# Patient Record
Sex: Female | Born: 1935 | Race: White | Hispanic: No | Smoking: Never smoker
Health system: Southern US, Community
[De-identification: ages and names within clinical notes are randomized; demographics above are authoritative.]

## PROBLEM LIST (undated history)

## (undated) DIAGNOSIS — J449 Chronic obstructive pulmonary disease, unspecified: Secondary | ICD-10-CM

## (undated) DIAGNOSIS — I10 Essential (primary) hypertension: Secondary | ICD-10-CM

## (undated) DIAGNOSIS — I951 Orthostatic hypotension: Secondary | ICD-10-CM

## (undated) DIAGNOSIS — F0280 Dementia in other diseases classified elsewhere without behavioral disturbance: Secondary | ICD-10-CM

## (undated) DIAGNOSIS — M25551 Pain in right hip: Secondary | ICD-10-CM

## (undated) DIAGNOSIS — F0391 Unspecified dementia with behavioral disturbance: Secondary | ICD-10-CM

## (undated) DIAGNOSIS — N189 Chronic kidney disease, unspecified: Secondary | ICD-10-CM

## (undated) DIAGNOSIS — J189 Pneumonia, unspecified organism: Secondary | ICD-10-CM

## (undated) DIAGNOSIS — J810 Acute pulmonary edema: Secondary | ICD-10-CM

## (undated) DIAGNOSIS — F329 Major depressive disorder, single episode, unspecified: Secondary | ICD-10-CM

## (undated) DIAGNOSIS — S32019A Unspecified fracture of first lumbar vertebra, initial encounter for closed fracture: Secondary | ICD-10-CM

## (undated) DIAGNOSIS — G309 Alzheimer's disease, unspecified: Secondary | ICD-10-CM

## (undated) DIAGNOSIS — S72009A Fracture of unspecified part of neck of unspecified femur, initial encounter for closed fracture: Secondary | ICD-10-CM

## (undated) DIAGNOSIS — F32A Depression, unspecified: Secondary | ICD-10-CM

## (undated) DIAGNOSIS — J9600 Acute respiratory failure, unspecified whether with hypoxia or hypercapnia: Secondary | ICD-10-CM

## (undated) HISTORY — DX: Acute pulmonary edema: J81.0

## (undated) HISTORY — DX: Pneumonia, unspecified organism: J18.9

## (undated) HISTORY — DX: Pain in right hip: M25.551

## (undated) HISTORY — DX: Acute respiratory failure, unspecified whether with hypoxia or hypercapnia: J96.00

## (undated) HISTORY — DX: Orthostatic hypotension: I95.1

## (undated) HISTORY — DX: Unspecified fracture of first lumbar vertebra, initial encounter for closed fracture: S32.019A

## (undated) HISTORY — DX: Unspecified dementia with behavioral disturbance: F03.91

## (undated) HISTORY — DX: Fracture of unspecified part of neck of unspecified femur, initial encounter for closed fracture: S72.009A

---

## 2004-11-28 ENCOUNTER — Inpatient Hospital Stay (HOSPITAL_COMMUNITY): Admission: EM | Admit: 2004-11-28 | Discharge: 2004-12-08 | Payer: Self-pay | Admitting: Emergency Medicine

## 2004-12-06 ENCOUNTER — Encounter (INDEPENDENT_AMBULATORY_CARE_PROVIDER_SITE_OTHER): Payer: Self-pay | Admitting: *Deleted

## 2010-03-15 ENCOUNTER — Inpatient Hospital Stay (HOSPITAL_COMMUNITY): Admission: EM | Admit: 2010-03-15 | Discharge: 2010-03-19 | Payer: Self-pay | Admitting: Emergency Medicine

## 2010-03-18 ENCOUNTER — Ambulatory Visit: Payer: Self-pay | Admitting: Physical Medicine & Rehabilitation

## 2010-03-19 ENCOUNTER — Inpatient Hospital Stay (HOSPITAL_COMMUNITY)
Admission: RE | Admit: 2010-03-19 | Discharge: 2010-03-26 | Payer: Self-pay | Admitting: Physical Medicine & Rehabilitation

## 2010-03-19 ENCOUNTER — Ambulatory Visit: Payer: Self-pay | Admitting: Physical Medicine & Rehabilitation

## 2010-12-12 ENCOUNTER — Encounter: Payer: Self-pay | Admitting: Family Medicine

## 2011-02-08 LAB — BASIC METABOLIC PANEL
BUN: 11 mg/dL (ref 6–23)
BUN: 17 mg/dL (ref 6–23)
BUN: 20 mg/dL (ref 6–23)
CO2: 24 mEq/L (ref 19–32)
CO2: 29 mEq/L (ref 19–32)
CO2: 34 mEq/L — ABNORMAL HIGH (ref 19–32)
CO2: 36 mEq/L — ABNORMAL HIGH (ref 19–32)
Calcium: 9.5 mg/dL (ref 8.4–10.5)
Calcium: 9.1 mg/dL (ref 8.4–10.5)
Calcium: 9.1 mg/dL (ref 8.4–10.5)
Chloride: 99 mEq/L (ref 96–112)
Chloride: 100 mEq/L (ref 96–112)
Chloride: 98 mEq/L (ref 96–112)
Creatinine, Ser: 1.81 mg/dL — ABNORMAL HIGH (ref 0.4–1.2)
Creatinine, Ser: 0.95 mg/dL (ref 0.4–1.2)
Creatinine, Ser: 1.17 mg/dL (ref 0.4–1.2)
GFR calc Af Amer: 32 mL/min — ABNORMAL LOW (ref >60)
GFR calc Af Amer: 53 mL/min — ABNORMAL LOW (ref >60)
GFR calc Af Amer: 57 mL/min — ABNORMAL LOW (ref >60)
GFR calc non Af Amer: >60 mL/min (ref >60)
GFR calc non Af Amer: 47 mL/min — ABNORMAL LOW (ref >60)
Glucose, Bld: 106 mg/dL — ABNORMAL HIGH (ref 70–99)
Glucose, Bld: 113 mg/dL — ABNORMAL HIGH (ref 70–99)
Potassium: 3.5 mEq/L (ref 3.5–5.1)
Potassium: 3.6 mEq/L (ref 3.5–5.1)
Potassium: 3.8 mEq/L (ref 3.5–5.1)
Sodium: 135 mEq/L (ref 135–145)
Sodium: 137 mEq/L (ref 135–145)
Sodium: 140 mEq/L (ref 135–145)

## 2011-02-08 LAB — DIFFERENTIAL
Basophils Absolute: 0 10*3/uL (ref 0.0–0.1)
Basophils Absolute: 0 10*3/uL (ref 0.0–0.1)
Basophils Relative: 0 % (ref 0–1)
Basophils Relative: 0 % (ref 0–1)
Eosinophils Absolute: 0 10*3/uL (ref 0.0–0.7)
Eosinophils Relative: 1 % (ref 0–5)
Eosinophils Relative: 0 % (ref 0–5)
Lymphocytes Relative: 59 % — ABNORMAL HIGH (ref 12–46)
Lymphocytes Relative: 37 % (ref 12–46)
Lymphs Abs: 5.1 K/uL — ABNORMAL HIGH (ref 0.7–4.0)
Monocytes Absolute: 0.7 10*3/uL (ref 0.1–1.0)
Monocytes Relative: 6 % (ref 3–12)
Monocytes Relative: 5 % (ref 3–12)
Neutro Abs: 4.7 10*3/uL (ref 1.7–7.7)
Neutro Abs: 7.9 K/uL — ABNORMAL HIGH (ref 1.7–7.7)
Neutrophils Relative %: 58 % (ref 43–77)

## 2011-02-08 LAB — CBC
HCT: 34.6 % — ABNORMAL LOW (ref 36.0–46.0)
Hemoglobin: 11.4 g/dL — ABNORMAL LOW (ref 12.0–15.0)
MCHC: 34.6 g/dL (ref 30.0–36.0)
MCHC: 32.9 g/dL (ref 30.0–36.0)
MCV: 91.3 fL (ref 78.0–100.0)
MCV: 91.4 fL (ref 78.0–100.0)
Platelets: 263 10*3/uL (ref 150–400)
Platelets: 222 K/uL (ref 150–400)
RBC: 3.78 MIL/uL — ABNORMAL LOW (ref 3.87–5.11)
RDW: 14 % (ref 11.5–15.5)
RDW: 13.9 % (ref 11.5–15.5)
WBC: 13.8 10*3/uL — ABNORMAL HIGH (ref 4.0–10.5)

## 2011-02-08 LAB — COMPREHENSIVE METABOLIC PANEL
AST: 28 U/L (ref 0–37)
Albumin: 3.2 g/dL — ABNORMAL LOW (ref 3.5–5.2)
Calcium: 9.6 mg/dL (ref 8.4–10.5)
Creatinine, Ser: 1.32 mg/dL — ABNORMAL HIGH (ref 0.4–1.2)
GFR calc Af Amer: 46 mL/min — ABNORMAL LOW (ref >60)
GFR calc non Af Amer: 38 mL/min — ABNORMAL LOW (ref >60)
Total Protein: 5.5 g/dL — ABNORMAL LOW (ref 6.0–8.3)

## 2011-02-08 LAB — POTASSIUM
Potassium: 3.2 mEq/L — ABNORMAL LOW (ref 3.5–5.1)
Potassium: 3.5 mEq/L (ref 3.5–5.1)

## 2011-02-08 LAB — URINALYSIS, ROUTINE W REFLEX MICROSCOPIC
Bilirubin Urine: NEGATIVE
Glucose, UA: NEGATIVE mg/dL
Hgb urine dipstick: NEGATIVE
Ketones, ur: NEGATIVE mg/dL
Nitrite: NEGATIVE
Protein, ur: NEGATIVE mg/dL
Specific Gravity, Urine: 1.018 (ref 1.005–1.030)
Urobilinogen, UA: 0.2 mg/dL (ref 0.0–1.0)
pH: 7 (ref 5.0–8.0)

## 2011-02-08 LAB — APTT: aPTT: 23 seconds — ABNORMAL LOW (ref 24–37)

## 2011-02-08 LAB — BASIC METABOLIC PANEL WITH GFR
BUN: 26 mg/dL — ABNORMAL HIGH (ref 6–23)
Chloride: 102 meq/L (ref 96–112)
Creatinine, Ser: 1.1 mg/dL (ref 0.4–1.2)
Glucose, Bld: 125 mg/dL — ABNORMAL HIGH (ref 70–99)
Potassium: 4.8 meq/L (ref 3.5–5.1)

## 2011-02-08 LAB — PROTIME-INR
INR: 1.07 (ref 0.00–1.49)
Prothrombin Time: 13.8 seconds (ref 11.6–15.2)

## 2011-04-08 NOTE — H&P (Signed)
NAMEMANDA, HOLSTAD NO.:  [id number]   MEDICAL RECORD NO.:  [id number]          PATIENT TYPE:  INP   LOCATION:  1828                         FACILITY:  [facility]   PHYSICIAN:  Danae Chen, M.D.DATE OF BIRTH:  January 08, 1919   DATE OF ADMISSION:  11/28/2004  DATE OF DISCHARGE:                                HISTORY & PHYSICAL   PRIMARY CARE PHYSICIANS:  Production assistant, radio.   CHIEF COMPLAINT:  Cough and shortness of breath.   HISTORY OF PRESENT ILLNESS:  The patient is a pleasant 77 year old elderly  female who presents to the emergency department with a six-day history of  progressive shortness of breath, productive cough, lethargy, and decreased  appetite.  She reports that she has been feeling like she has had a cold for  a couple of weeks, but only got worse over the last couple of days.  She has  felt feverish but has not checked her temperature.  She has had no chills,  however.  No nausea, vomiting, or diarrhea.  Her appetite has been decreased  somewhat.  She normally takes an Advair inhaler and Lexapro for her  depression, but has not been taking anything else over-the-counter for her  symptoms.  She has also had some rhinorrhea and some sinus congestion as  well.  She has never been hospitalized for pneumonia or COPD exacerbation to  her knowledge.  She had a diagnosis of leukemia almost 20 years ago, but she  said that this is currently stable and is not being followed by any  specialist.  She denies any chest pain.  She has had the shortness of breath  when she coughs.  No recent weight gain or weight loss.  No hematochezia.  No melena.  No hematemesis.  Slight headache.   PAST MEDICAL HISTORY:  1.  Depression.  2.  COPD.  3.  Leukemia, remote.   PAST SURGICAL HISTORY:  None.   FAMILY HISTORY:  No diabetes, hypertension, or coronary artery disease that  she is aware of.   SOCIAL HISTORY:  The patient is widowed.  She has two grown  children.  She  lives with her son.  Her daughter and son-in-law live nearby.  She does not  smoke.  She does not drink.  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  She is still  independent of activities of daily life.  She still drives.   REVIEW OF SYMPTOMS:  Per the history of present illness.   LABORATORY DATA:  White count 11.7, hemoglobin 12.3, platelets 249,000.  Chest x-ray is consistent with COPD, no infiltrates or active disease noted  at this time.   PHYSICAL EXAMINATION:  VITAL SIGNS:  Temperature 97, pulse 65, blood  pressure 123/63, O2 saturation 99% on 2 L.  GENERAL:  She is in no acute distress.  HEENT:  Oropharynx is clear.  She has mild sinus tenderness to palpation  over her maxillary sinuses.  NECK:  Supple.  No lymphadenopathy.  LUNG EXAM:  Reveals diffuse wheezing throughout, upper lobes greater than  lower lobes,  some rales as well with decreased breath sounds bilaterally on  the bases.  CARDIAC:  Heart rate is regular, no murmurs.  ABDOMEN:  Soft, nontender, no rebound or guarding.  EXTREMITIES:  No peripheral edema.  NEUROLOGICAL:  Nonfocal.  She is alert and oriented.  Grip strength 5/5  bilaterally.   IMPRESSION:  This is an 77 year old with underlying chronic obstructive  pulmonary disease with probable exacerbation of such with bronchitis and/or  pneumonia.  She may be a bit dry, and, with hydration, she may fluff out a  pneumonia as well.   PLAN:  At this time, we will admit her to a telemetry bed, start IV  antibiotics and IV steroids, breathing treatments, oxygen, mucolytic agent,  and cough syrup.  We will continue her depression medication.  We will  reassess her clinically and repeat a chest x-ray if necessary.      RLK/MEDQ  D:  11/28/2004  T:  11/28/2004  Job:  [id number]   cc:   [facility]

## 2011-04-08 NOTE — Discharge Summary (Signed)
NAMEBRENNA, Andrea [name] NO.:  [id number]   MEDICAL RECORD NO.:  [id number]          PATIENT TYPE:  INP   LOCATION:  6743                         FACILITY:  [facility]   PHYSICIAN:  Danae Chen, M.D.DATE OF BIRTH:  09-10-1919   DATE OF ADMISSION:  11/28/2004  DATE OF DISCHARGE:  12/08/2004                                 DISCHARGE SUMMARY   PRIMARY PHYSICIAN:  Maryelizabeth Rowan, M.D., [facility].   DISCHARGE DIAGNOSIS:  1.  Community acquired bacterial pneumonia.  2.  Exacerbation of chronic obstructive pulmonary disease with bronchitis.  3.  Chronic obstructive pulmonary disease.  4.  Depression.   DISCHARGE MEDICATIONS:  The patient is to resume her home medications at her  prior doses.  This includes:  1.  Aspirin 81 mg p.o. daily.  2.  Lexapro 20 mg p.o. daily.  3.  Advair Diskus 50/250 mcg 1 puff daily.  New medications:  Albuterol measured dose inhaler 2 puffs four times a day  as needed for shortness of breath.   FOLLOWUP APPOINTMENT:  She is to see Dr. Duanne Guess at [facility] on 12/16/04, Thursday, at 12 noon.   PROCEDURES:  As noted, 2D echocardiogram showing ejection fraction of 40-  50%.  Swallow evaluation showing no obvious swallowing problems, no  dysphagia.   BRIEF HOSPITAL COURSE:  The patient is an 77 year old female who was  admitted with complaints of cough and shortness of breath.  Initial chest x-  ray was consistent with COPD but having shown no infiltrate but she was  desaturating off of oxygen.  She was admitted for a probable COPD  exacerbation with a secondary viral URI.  During her hospital course, it was  found that she had probably developed a bacterial pneumonia with a low grade  fever and some productive sputum.  Empiric antibiotics were started, along  with steroids, oxygen, and breathing treatments.  Her condition did improve  but she had complained of some difficulty swallowing during her  hospital  course.  Swallow study was done which showed no evidence of overt dysphagia.  In addition, an echocardiogram was also checked for the persistent fatigue  and deconditioning but this showed preserved ejection fraction of 40-50%.  At the time of discharge, her condition is improved and she does have home  health physical therapy ordered to come out to her home and help her with  reconditioning.  She is to go home with her daughter, who will be providing  care for her.  At the time of discharge, she has completed a 10-day course  of antibiotics, as well as her prednisone taper.  On followup, if she could  have her room air O2 saturation checked, lung exam, and overall functional  capacity.   PERTINENT DISCHARGE LABORATORY DATA:  BUN 33, creatinine 0.9, sodium 135,  potassium 3.8, glucose 127.  She was afebrile at the time of discharge.  Lung exam was clear.  Heart rate regular and O2 saturation was 97% on room  air.      RLK/MEDQ  D:  12/08/2004  T:  12/08/2004  Job:  [id number]  cc:   Maryelizabeth Rowan, M.D.  [facility]  [place], [place] [address]  Fax: [phone]

## 2013-06-16 ENCOUNTER — Encounter (HOSPITAL_COMMUNITY): Payer: Self-pay | Admitting: *Deleted

## 2013-06-16 ENCOUNTER — Emergency Department (HOSPITAL_COMMUNITY)
Admission: EM | Admit: 2013-06-16 | Discharge: 2013-06-17 | Disposition: A | Discharge status: Other Acute Inpatient Hospital | Payer: Medicare Other | Attending: Emergency Medicine | Admitting: Emergency Medicine

## 2013-06-16 DIAGNOSIS — F0280 Dementia in other diseases classified elsewhere without behavioral disturbance: Secondary | ICD-10-CM | POA: Insufficient documentation

## 2013-06-16 DIAGNOSIS — Z79899 Other long term (current) drug therapy: Secondary | ICD-10-CM | POA: Insufficient documentation

## 2013-06-16 DIAGNOSIS — F322 Major depressive disorder, single episode, severe without psychotic features: Secondary | ICD-10-CM

## 2013-06-16 DIAGNOSIS — I129 Hypertensive chronic kidney disease with stage 1 through stage 4 chronic kidney disease, or unspecified chronic kidney disease: Secondary | ICD-10-CM | POA: Insufficient documentation

## 2013-06-16 DIAGNOSIS — F0390 Unspecified dementia without behavioral disturbance: Secondary | ICD-10-CM | POA: Insufficient documentation

## 2013-06-16 DIAGNOSIS — J4489 Other specified chronic obstructive pulmonary disease: Secondary | ICD-10-CM | POA: Insufficient documentation

## 2013-06-16 DIAGNOSIS — N189 Chronic kidney disease, unspecified: Secondary | ICD-10-CM | POA: Insufficient documentation

## 2013-06-16 DIAGNOSIS — J449 Chronic obstructive pulmonary disease, unspecified: Secondary | ICD-10-CM | POA: Insufficient documentation

## 2013-06-16 DIAGNOSIS — G309 Alzheimer's disease, unspecified: Secondary | ICD-10-CM | POA: Insufficient documentation

## 2013-06-16 HISTORY — DX: Dementia in other diseases classified elsewhere, unspecified severity, without behavioral disturbance, psychotic disturbance, mood disturbance, and anxiety: F02.80

## 2013-06-16 HISTORY — DX: Major depressive disorder, single episode, unspecified: F32.9

## 2013-06-16 HISTORY — DX: Depression, unspecified: F32.A

## 2013-06-16 HISTORY — DX: Essential (primary) hypertension: I10

## 2013-06-16 HISTORY — DX: Alzheimer's disease, unspecified: G30.9

## 2013-06-16 HISTORY — DX: Chronic obstructive pulmonary disease, unspecified: J44.9

## 2013-06-16 HISTORY — DX: Chronic kidney disease, unspecified: N18.9

## 2013-06-16 LAB — CBC
HCT: 38.1 % (ref 36.0–46.0)
Hemoglobin: 13.2 g/dL (ref 12.0–15.0)
MCH: 31.7 pg (ref 26.0–34.0)
MCHC: 34.6 g/dL (ref 30.0–36.0)
MCV: 91.6 fL (ref 78.0–100.0)
Platelets: 251 K/uL (ref 150–400)
RBC: 4.16 MIL/uL (ref 3.87–5.11)
RDW: 13.6 % (ref 11.5–15.5)
WBC: 8.9 K/uL (ref 4.0–10.5)

## 2013-06-16 LAB — COMPREHENSIVE METABOLIC PANEL WITH GFR
ALT: 10 U/L (ref 0–35)
AST: 17 U/L (ref 0–37)
Albumin: 4.3 g/dL (ref 3.5–5.2)
Alkaline Phosphatase: 50 U/L (ref 39–117)
Chloride: 95 meq/L — ABNORMAL LOW (ref 96–112)
Potassium: 3.9 meq/L (ref 3.5–5.1)
Sodium: 134 meq/L — ABNORMAL LOW (ref 135–145)
Total Bilirubin: 0.5 mg/dL (ref 0.3–1.2)

## 2013-06-16 LAB — COMPREHENSIVE METABOLIC PANEL
BUN: 25 mg/dL — ABNORMAL HIGH (ref 6–23)
CO2: 25 mEq/L (ref 19–32)
Calcium: 10.1 mg/dL (ref 8.4–10.5)
Creatinine, Ser: 1.44 mg/dL — ABNORMAL HIGH (ref 0.50–1.10)
GFR calc Af Amer: 36 mL/min — ABNORMAL LOW (ref >90)
GFR calc non Af Amer: 31 mL/min — ABNORMAL LOW (ref >90)
Glucose, Bld: 99 mg/dL (ref 70–99)
Total Protein: 6.6 g/dL (ref 6.0–8.3)

## 2013-06-16 LAB — ETHANOL: Alcohol, Ethyl (B): <11 mg/dL (ref 0–11)

## 2013-06-16 LAB — URINALYSIS, ROUTINE W REFLEX MICROSCOPIC
Bilirubin Urine: NEGATIVE
Glucose, UA: NEGATIVE mg/dL
Hgb urine dipstick: NEGATIVE
Ketones, ur: NEGATIVE mg/dL
Nitrite: NEGATIVE
Protein, ur: NEGATIVE mg/dL
Specific Gravity, Urine: 1.019 (ref 1.005–1.030)
Urobilinogen, UA: 1 mg/dL (ref 0.0–1.0)
pH: 8 (ref 5.0–8.0)

## 2013-06-16 LAB — RAPID URINE DRUG SCREEN, HOSP PERFORMED
Amphetamines: NOT DETECTED
Barbiturates: NOT DETECTED
Benzodiazepines: NOT DETECTED
Cocaine: NOT DETECTED
Opiates: NOT DETECTED
Tetrahydrocannabinol: NOT DETECTED

## 2013-06-16 LAB — URINE MICROSCOPIC-ADD ON

## 2013-06-16 LAB — SALICYLATE LEVEL: Salicylate Lvl: <2 mg/dL — ABNORMAL LOW (ref 2.8–20.0)

## 2013-06-16 LAB — ACETAMINOPHEN LEVEL: Acetaminophen (Tylenol), Serum: <15 ug/mL (ref 10–30)

## 2013-06-16 MED ORDER — DONEPEZIL HCL 10 MG PO TABS
10.0000 mg | ORAL_TABLET | Freq: Every day | ORAL | Status: DC
  Administered 2013-06-17: 10 mg via ORAL
  Filled 2013-06-16: qty 1

## 2013-06-16 MED ORDER — LOSARTAN POTASSIUM 50 MG PO TABS
100.0000 mg | ORAL_TABLET | Freq: Every day | ORAL | Status: DC
  Administered 2013-06-16 – 2013-06-17 (×2): 100 mg via ORAL
  Filled 2013-06-16 (×2): qty 2

## 2013-06-16 MED ORDER — TIOTROPIUM BROMIDE MONOHYDRATE 18 MCG IN CAPS
18.0000 ug | ORAL_CAPSULE | Freq: Every day | RESPIRATORY_TRACT | Status: DC
  Administered 2013-06-17: 18 ug via RESPIRATORY_TRACT
  Filled 2013-06-16: qty 5

## 2013-06-16 MED ORDER — ACETAMINOPHEN 325 MG PO TABS: 650.0000 mg | ORAL_TABLET | ORAL | Status: DC | PRN

## 2013-06-16 MED ORDER — HYDROCHLOROTHIAZIDE 12.5 MG PO CAPS
12.5000 mg | ORAL_CAPSULE | Freq: Every day | ORAL | Status: DC
  Administered 2013-06-16 – 2013-06-17 (×2): 12.5 mg via ORAL
  Filled 2013-06-16 (×2): qty 1

## 2013-06-16 MED ORDER — LORAZEPAM 1 MG PO TABS
0.5000 mg | ORAL_TABLET | Freq: Three times a day (TID) | ORAL | Status: DC | PRN
  Administered 2013-06-16: 0.5 mg via ORAL
  Filled 2013-06-16 (×2): qty 1

## 2013-06-16 MED ORDER — ESCITALOPRAM OXALATE 10 MG PO TABS
20.0000 mg | ORAL_TABLET | Freq: Every day | ORAL | Status: DC
  Administered 2013-06-16 – 2013-06-17 (×2): 20 mg via ORAL
  Filled 2013-06-16: qty 2
  Filled 2013-06-16 (×2): qty 1

## 2013-06-16 MED ORDER — BUPROPION HCL ER (XL) 150 MG PO TB24
150.0000 mg | ORAL_TABLET | Freq: Every day | ORAL | Status: DC
  Administered 2013-06-17: 150 mg via ORAL
  Filled 2013-06-16: qty 1

## 2013-06-16 NOTE — BH Assessment (Addendum)
Assessment Note     Patient is a 79 year old white female that was brought to the ER by her daughter and grand daughter.  Patient reports feelings of depression and hopelessness. Patient was alert and orientated during the assessment.  Patient reports a family history of mental illness. Patient reports previous psychiatric hospitalizations for SI.  Patient reports that her husband died in 65 and her son committed suicide in 2008.  Patient repots, "ever since her husband died in 200 she has suffered with deep depression".  Patient receives medication management for her depression.  Patient reports that the medication is not working.  Patient denies having a plan to hurt herself.  Patient denies HI.  Patient denies psychosis.     Axis I:  Dementia and Major Depressive Disorder  Axis II: Deferred Axis III:  Past Medical History  Diagnosis Date  . COPD (chronic obstructive pulmonary disease)   . Alzheimer's dementia   . Depression   . Chronic kidney disease   . Hypertension    Axis IV: other psychosocial or environmental problems and problems related to social environment Axis V: 31-40 impairment in reality testing  Past Medical History:  Past Medical History  Diagnosis Date  . COPD (chronic obstructive pulmonary disease)   . Alzheimer's dementia   . Depression   . Chronic kidney disease   . Hypertension     History reviewed. No pertinent past surgical history.  Family History:  Family History  Problem Relation Age of Onset  . Suicidality Other     Social History:  reports that she has never smoked. She does not have any smokeless tobacco history on file. She reports that she does not drink alcohol. Her drug history is not on file.  Additional Social History:     CIWA: CIWA-Ar BP: 145/62 mmHg Pulse Rate: 76 COWS:    Allergies:  Allergies  Allergen Reactions  . Aspirin     Stomach upset    Home Medications:  (Not in a hospital admission)  OB/GYN Status:  No LMP  recorded. Patient is postmenopausal.  General Assessment Data Location of Assessment: [facility] ED ACT Assessment: Yes Living Arrangements: Children Can pt return to current living arrangement?: Yes Admission Status: Voluntary Is patient capable of signing voluntary admission?: Yes Transfer from: Acute Hospital Referral Source: Self/Family/Friend  Education Status Is patient currently in school?: No  Risk to self Suicidal Ideation: Yes-Currently Present Suicidal Intent: No Is patient at risk for suicide?: Yes Suicidal Plan?: No Access to Means: No What has been your use of drugs/alcohol within the last 12 months?: None Previous Attempts/Gestures: No How many times?: 2 Other Self Harm Risks: None Triggers for Past Attempts: Family contact;Anniversary;Unpredictable Intentional Self Injurious Behavior: None Family Suicide History: Yes (2008 son shot himself) Recent stressful life event(s): Loss (Comment) (Husband died in 73) Persecutory voices/beliefs?: No Depression: Yes Depression Symptoms: Despondent;Tearfulness;Isolating;Fatigue;Loss of interest in usual pleasures;Feeling worthless/self pity Substance abuse history and/or treatment for substance abuse?: No Suicide prevention information given to non-admitted patients: Yes  Risk to Others Homicidal Ideation: No Thoughts of Harm to Others: No Current Homicidal Intent: No Current Homicidal Plan: No Access to Homicidal Means: No Identified Victim: None Reported History of harm to others?: No Assessment of Violence: None Noted Violent Behavior Description: calm and cooperative Does patient have access to weapons?: No Criminal Charges Pending?: No Does patient have a court date: No  Psychosis Hallucinations: None noted Delusions: None noted  Mental Status Report Appear/Hygiene: Other (Comment) Eye Contact:  Fair Motor Activity: Freedom of movement Speech: Soft;Slow Level of Consciousness: Quiet/awake Mood:  Depressed;Sullen Affect: Depressed;Blunted Anxiety Level: None Thought Processes: Coherent;Relevant Judgement: Unimpaired Orientation: Person;Place;Time;Situation Obsessive Compulsive Thoughts/Behaviors: None  Cognitive Functioning Concentration: Decreased Memory: Recent Impaired;Remote Impaired IQ: Average Insight: Fair Impulse Control: Poor Appetite: Fair Weight Loss: 0 Weight Gain: 0 Sleep: Increased Total Hours of Sleep: 9 Vegetative Symptoms: None  ADLScreening [facility] Assessment Services) Patient's cognitive ability adequate to safely complete daily activities?: Yes Patient able to express need for assistance with ADLs?: Yes Independently performs ADLs?: No (Patient uses a cane )  Abuse/Neglect [facility]) Physical Abuse: Denies Verbal Abuse: Denies Sexual Abuse: Denies  Prior Inpatient Therapy Prior Inpatient Therapy: No Prior Therapy Dates: na Prior Therapy Facilty/Provider(s): na Reason for Treatment: na  Prior Outpatient Therapy Prior Outpatient Therapy: Yes Prior Therapy Dates: ongoing  Reason for Treatment: Depression  ADL Screening (condition at time of admission) Patient's cognitive ability adequate to safely complete daily activities?: Yes Patient able to express need for assistance with ADLs?: Yes Independently performs ADLs?: No (Patient uses a cane ) Walks in Home: Independent with device (comment) (4-prong walker)       Abuse/Neglect Assessment (Assessment to be complete while patient is alone) Physical Abuse: Denies Verbal Abuse: Denies Sexual Abuse: Denies Values / Beliefs Cultural Requests During Hospitalization: None Spiritual Requests During Hospitalization: None        Additional Information 1:1 In Past 12 Months?: No CIRT Risk: No Elopement Risk: No Does patient have medical clearance?: Yes     Disposition: Pending inpatient placement.  Disposition Initial Assessment Completed for this Encounter: Yes Disposition of Patient:  Referred to Patient referred to: Other (Comment)  On Site Evaluation by:   Reviewed with Physician:     Phillip Heal LaVerne 06/16/2013 6:08 PM

## 2013-06-16 NOTE — BHH Counselor (Signed)
Writer faxed assessment and supporting documentation to [facility]  for placement.  Writer informed the patient and her daughter of the referral.

## 2013-06-16 NOTE — ED Notes (Signed)
The [pt has had  Depression for months and she just moved here from [place].  She takes one med and that is not helping her depression according to her daughter.

## 2013-06-16 NOTE — ED Provider Notes (Signed)
CSN: [id number]     Arrival date & time 06/16/13  1520 History     First MD Initiated Contact with Patient 06/16/13 1558     Chief Complaint  Patient presents with  . Depression   (Consider location/radiation/quality/duration/timing/severity/associated sxs/prior Treatment) HPI Comments: Pt with long standing depression, h/o SI in the past, lives 6 months in [place], 6 months here, son has committed suicide in the past, has gotten more depressed, has lost will to live, no active suicide plans currently, but she is fearful she might get to that point.  Patient is a 78 y.o. female presenting with mental health disorder. The history is provided by the patient and a relative.  Mental Health Problem Presenting symptoms: depression   Presenting symptoms: no self mutilation and no suicidal thoughts   Onset quality:  Gradual Progression:  Worsening Chronicity:  Chronic Context: not alcohol use, not drug abuse, not medication, not noncompliant, not recent medication change and not stressful life event   Associated symptoms: anhedonia   Associated symptoms: no abdominal pain, no appetite change, no decreased need for sleep, no euphoric mood, no insomnia and no poor judgment   Risk factors: family hx of mental illness and hx of mental illness   Risk factors: no hx of suicide attempts     Past Medical History  Diagnosis Date  . COPD (chronic obstructive pulmonary disease)   . Alzheimer's dementia   . Depression   . Chronic kidney disease   . Hypertension    History reviewed. No pertinent past surgical history. Family History  Problem Relation Age of Onset  . Suicidality Other    History  Substance Use Topics  . Smoking status: Never Smoker   . Smokeless tobacco: Not on file  . Alcohol Use: No   OB History   Grav Para Term Preterm Abortions TAB SAB Ect Mult Living                 Review of Systems  Constitutional: Negative for appetite change.  Gastrointestinal: Negative for  abdominal pain.  Psychiatric/Behavioral: Positive for dysphoric mood. Negative for suicidal ideas, sleep disturbance and self-injury. The patient does not have insomnia.   All other systems reviewed and are negative.    Allergies  Aspirin  Home Medications   Current Outpatient Rx  Name  Route  Sig  Dispense  Refill  . buPROPion (WELLBUTRIN XL) 150 MG 24 hr tablet   Oral   Take 150 mg by mouth daily. Take 150mg  once daily for 7 days, then increase to 300mg  daily         . clonazePAM (KLONOPIN) 0.5 MG tablet   Oral   Take 0.25 mg by mouth 2 (two) times daily as needed for anxiety.         . donepezil (ARICEPT) 10 MG tablet   Oral   Take 10 mg by mouth daily.         Marland Kitchen escitalopram (LEXAPRO) 20 MG tablet   Oral   Take 20 mg by mouth daily.         Marland Kitchen losartan-hydrochlorothiazide (HYZAAR) 100-12.5 MG per tablet   Oral   Take 1 tablet by mouth daily.         Marland Kitchen tiotropium (SPIRIVA) 18 MCG inhalation capsule   Inhalation   Place 18 mcg into inhaler and inhale daily.          BP 145/62  Pulse 76  Temp(Src) 97.9 F (36.6 C) (Oral)  Resp 18  SpO2 97% Physical Exam  Nursing note and vitals reviewed. Constitutional: She appears well-developed and well-nourished. No distress.  HENT:  Head: Normocephalic and atraumatic.  Eyes: Conjunctivae and EOM are normal. No scleral icterus.  Neck: Normal range of motion. Neck supple.  Cardiovascular: Normal rate, regular rhythm and intact distal pulses.   Pulmonary/Chest: Effort normal. No respiratory distress.  Abdominal: Soft. There is no tenderness.  Neurological: She is alert. She has normal strength. No sensory deficit. She exhibits normal muscle tone. Coordination normal.  Skin: Skin is warm and dry.  Psychiatric: Her speech is not rapid and/or pressured, not delayed, not tangential and not slurred. She is not agitated, not aggressive, not slowed and not withdrawn. She does not express impulsivity. She exhibits a  depressed mood. She is communicative.    ED Course   Procedures (including critical care time)  Labs Reviewed  CBC  ACETAMINOPHEN LEVEL  COMPREHENSIVE METABOLIC PANEL  ETHANOL  SALICYLATE LEVEL  URINE RAPID DRUG SCREEN (HOSP PERFORMED)  URINALYSIS, ROUTINE W REFLEX MICROSCOPIC   No results found. 1. Severe major depression     RA sat is 97% and I interpret to be normal   MDM  Labs pending.  Pt is voluntary, major depression with passive thoughts of dying and lost will to live.  Pt is willing to be admitted for severe depression.  Pt's PCP is aware of severe depression, has referred to a psychiatrist, but has not yet established.  I would recommend inpt for severe depression.    Gavin Pound. Oletta Lamas, MD 06/17/13 [phone]

## 2013-06-17 ENCOUNTER — Emergency Department (HOSPITAL_COMMUNITY): Payer: Medicare Other

## 2013-06-17 NOTE — BH Assessment (Signed)
Assessment Note   Update:  Received call from Nicholos Johns at [facility] stating she needed pt's EKG an chest x-ray, but that pt was accepted to Dr. Guss Bunde and could arrive after 1300 today.  Called pt's daughter who felt uncomfortable by report taking pt voluntarily to [facility].  As pt has SI, EDP Rancour took out IVC papers on pt, as she is a danger to herself.  Papers faxed to Magistrate and served by [employer].  Called Sgt. Paschal @ 1200 with [employer]. Transport and pt to be transported via [employer] and [address]  Called [facility] as well per [facility] recommendation, as pt 79 years of age to ensure a safe transport.  Pt nurse to call pt's daughter to inform her that pt being transported.  Updated EDP Rancour and ED staff.  Updated assessment disposition.  Pt to be discharged.    Disposition:  Disposition Initial Assessment Completed for this Encounter: Yes Disposition of Patient: Inpatient treatment program Type of inpatient treatment program: Adult Patient referred to: Other (Comment) (Pt accepted [facility])  On Site Evaluation by:   Reviewed with Physician:  Rancour   Caryl Comes 06/17/2013 12:07 PM

## 2013-06-17 NOTE — ED Notes (Signed)
Sheriff arrived now waiting for [facility]

## 2013-06-17 NOTE — ED Notes (Signed)
ACT team states patient accepted to IVC papers completed called [facility] and sheriff for transport.

## 2013-06-17 NOTE — ED Notes (Signed)
[facility] police department arrived to serve IVC papers.

## 2013-06-17 NOTE — ED Provider Notes (Signed)
Results for orders placed during the hospital encounter of 06/16/13  ACETAMINOPHEN LEVEL      Result Value Range   Acetaminophen (Tylenol), Serum <15.0  10 - 30 ug/mL  CBC      Result Value Range   WBC 8.9  4.0 - 10.5 K/uL   RBC 4.16  3.87 - 5.11 MIL/uL   Hemoglobin 13.2  12.0 - 15.0 g/dL   HCT 57.8  46.9 - 62.9 %   MCV 91.6  78.0 - 100.0 fL   MCH 31.7  26.0 - 34.0 pg   MCHC 34.6  30.0 - 36.0 g/dL   RDW 52.8  41.3 - 24.4 %   Platelets 251  150 - 400 K/uL  COMPREHENSIVE METABOLIC PANEL      Result Value Range   Sodium 134 (*) 135 - 145 mEq/L   Potassium 3.9  3.5 - 5.1 mEq/L   Chloride 95 (*) 96 - 112 mEq/L   CO2 25  19 - 32 mEq/L   Glucose, Bld 99  70 - 99 mg/dL   BUN 25 (*) 6 - 23 mg/dL   Creatinine, Ser 0.10 (*) 0.50 - 1.10 mg/dL   Calcium 27.2  8.4 - 53.6 mg/dL   Total Protein 6.6  6.0 - 8.3 g/dL   Albumin 4.3  3.5 - 5.2 g/dL   AST 17  0 - 37 U/L   ALT 10  0 - 35 U/L   Alkaline Phosphatase 50  39 - 117 U/L   Total Bilirubin 0.5  0.3 - 1.2 mg/dL   GFR calc non Af Amer 31 (*) >90 mL/min   GFR calc Af Amer 36 (*) >90 mL/min  ETHANOL      Result Value Range   Alcohol, Ethyl (B) <11  0 - 11 mg/dL  SALICYLATE LEVEL      Result Value Range   Salicylate Lvl <2.0 (*) 2.8 - 20.0 mg/dL  URINE RAPID DRUG SCREEN (HOSP PERFORMED)      Result Value Range   Opiates NONE DETECTED  NONE DETECTED   Cocaine NONE DETECTED  NONE DETECTED   Benzodiazepines NONE DETECTED  NONE DETECTED   Amphetamines NONE DETECTED  NONE DETECTED   Tetrahydrocannabinol NONE DETECTED  NONE DETECTED   Barbiturates NONE DETECTED  NONE DETECTED  URINALYSIS, ROUTINE W REFLEX MICROSCOPIC      Result Value Range   Color, Urine YELLOW  YELLOW   APPearance CLOUDY (*) CLEAR   Specific Gravity, Urine 1.019  1.005 - 1.030   pH 8.0  5.0 - 8.0   Glucose, UA NEGATIVE  NEGATIVE mg/dL   Hgb urine dipstick NEGATIVE  NEGATIVE   Bilirubin Urine NEGATIVE  NEGATIVE   Ketones, ur NEGATIVE  NEGATIVE mg/dL   Protein, ur  NEGATIVE  NEGATIVE mg/dL   Urobilinogen, UA 1.0  0.0 - 1.0 mg/dL   Nitrite NEGATIVE  NEGATIVE   Leukocytes, UA LARGE (*) NEGATIVE  URINE MICROSCOPIC-ADD ON      Result Value Range   Squamous Epithelial / LPF FEW (*) RARE   WBC, UA 3-6  <3 WBC/hpf   Bacteria, UA RARE  RARE   Urine-Other LESS THAN 10 mL OF URINE SUBMITTED      Date: 06/17/2013  Rate: 69  Rhythm: normal sinus rhythm  QRS Axis: normal  Intervals: PR prolonged  ST/T Wave abnormalities: nonspecific ST changes  Conduction Disutrbances:first-degree A-V block   Narrative Interpretation:   Old EKG Reviewed: unchanged    Sunnie Nielsen, MD 06/17/13 0630

## 2013-06-17 NOTE — ED Notes (Signed)
[place], Radiographer, therapeutic, arrived for transport. Waiting for [facility].

## 2013-06-17 NOTE — ED Provider Notes (Signed)
Patient acccepted to [facility] by Dr. Guss Bunde.  BP 138/75  Pulse 72  Temp(Src) 98.1 F (36.7 C) (Oral)  Resp 16  SpO2 94%   Andrea Octave, MD 06/17/13 1210

## 2013-06-17 NOTE — ED Notes (Signed)
Patient states does not want to go to [facility] upset about transfer agreeing to go with [facility].

## 2013-06-17 NOTE — ED Notes (Signed)
Sheriff called stated will be here before 1900.  ACT team notified.

## 2013-06-17 NOTE — ED Notes (Signed)
[facility] arrived.  

## 2013-06-17 NOTE — ED Notes (Signed)
Sheriff called stated will be on their way to ED now. Secretary paged [facility] for transport.

## 2013-06-17 NOTE — ED Notes (Signed)
Spoke with Nicholos Johns at [facility] states their facility is not ready for patient. Her instructions are waiting for 5 discharges and one was cancelled.  Nicholos Johns will call when ready to receive patient.  ACT team notified.

## 2013-07-09 ENCOUNTER — Emergency Department (HOSPITAL_COMMUNITY)
Admission: EM | Admit: 2013-07-09 | Discharge: 2013-07-09 | Disposition: A | Discharge status: Home / Self Care | Payer: Medicare Other | Attending: Emergency Medicine | Admitting: Emergency Medicine

## 2013-07-09 ENCOUNTER — Encounter (HOSPITAL_COMMUNITY): Payer: Self-pay

## 2013-07-09 ENCOUNTER — Emergency Department (HOSPITAL_COMMUNITY): Payer: Medicare Other

## 2013-07-09 DIAGNOSIS — W1809XA Striking against other object with subsequent fall, initial encounter: Secondary | ICD-10-CM | POA: Insufficient documentation

## 2013-07-09 DIAGNOSIS — F3289 Other specified depressive episodes: Secondary | ICD-10-CM | POA: Insufficient documentation

## 2013-07-09 DIAGNOSIS — Z79899 Other long term (current) drug therapy: Secondary | ICD-10-CM | POA: Insufficient documentation

## 2013-07-09 DIAGNOSIS — J449 Chronic obstructive pulmonary disease, unspecified: Secondary | ICD-10-CM | POA: Insufficient documentation

## 2013-07-09 DIAGNOSIS — Y92009 Unspecified place in unspecified non-institutional (private) residence as the place of occurrence of the external cause: Secondary | ICD-10-CM | POA: Insufficient documentation

## 2013-07-09 DIAGNOSIS — S0990XA Unspecified injury of head, initial encounter: Secondary | ICD-10-CM

## 2013-07-09 DIAGNOSIS — W19XXXA Unspecified fall, initial encounter: Secondary | ICD-10-CM

## 2013-07-09 DIAGNOSIS — Y9301 Activity, walking, marching and hiking: Secondary | ICD-10-CM | POA: Insufficient documentation

## 2013-07-09 DIAGNOSIS — F0280 Dementia in other diseases classified elsewhere without behavioral disturbance: Secondary | ICD-10-CM | POA: Insufficient documentation

## 2013-07-09 DIAGNOSIS — F329 Major depressive disorder, single episode, unspecified: Secondary | ICD-10-CM | POA: Insufficient documentation

## 2013-07-09 DIAGNOSIS — J4489 Other specified chronic obstructive pulmonary disease: Secondary | ICD-10-CM | POA: Insufficient documentation

## 2013-07-09 DIAGNOSIS — G309 Alzheimer's disease, unspecified: Secondary | ICD-10-CM | POA: Insufficient documentation

## 2013-07-09 DIAGNOSIS — N189 Chronic kidney disease, unspecified: Secondary | ICD-10-CM | POA: Insufficient documentation

## 2013-07-09 DIAGNOSIS — I129 Hypertensive chronic kidney disease with stage 1 through stage 4 chronic kidney disease, or unspecified chronic kidney disease: Secondary | ICD-10-CM | POA: Insufficient documentation

## 2013-07-09 LAB — URINALYSIS, ROUTINE W REFLEX MICROSCOPIC
Glucose, UA: NEGATIVE mg/dL
Hgb urine dipstick: NEGATIVE
Leukocytes, UA: NEGATIVE
Protein, ur: NEGATIVE mg/dL
Specific Gravity, Urine: 1.017 (ref 1.005–1.030)
pH: 6.5 (ref 5.0–8.0)

## 2013-07-09 LAB — BASIC METABOLIC PANEL
CO2: 26 mEq/L (ref 19–32)
Calcium: 9.7 mg/dL (ref 8.4–10.5)
Chloride: 95 mEq/L — ABNORMAL LOW (ref 96–112)
Glucose, Bld: 95 mg/dL (ref 70–99)
Potassium: 4.1 mEq/L (ref 3.5–5.1)
Sodium: 132 mEq/L — ABNORMAL LOW (ref 135–145)

## 2013-07-09 LAB — CBC WITH DIFFERENTIAL/PLATELET
Basophils Absolute: 0 10*3/uL (ref 0.0–0.1)
Lymphocytes Relative: 29 % (ref 12–46)
Lymphs Abs: 2.1 10*3/uL (ref 0.7–4.0)
MCV: 91.4 fL (ref 78.0–100.0)
Neutro Abs: 4.6 10*3/uL (ref 1.7–7.7)
Platelets: 209 10*3/uL (ref 150–400)
RBC: 3.83 MIL/uL — ABNORMAL LOW (ref 3.87–5.11)
RDW: 13.6 % (ref 11.5–15.5)
WBC: 7.4 10*3/uL (ref 4.0–10.5)

## 2013-07-09 MED ORDER — SODIUM CHLORIDE 0.9 % IV SOLN
INTRAVENOUS | Status: DC
  Administered 2013-07-09: 13:00:00 via INTRAVENOUS

## 2013-07-09 NOTE — ED Notes (Signed)
Completed orthostatic vital signs. When standing pt pt had to hold on to me for assistance to stand. Pt stated she felt very dizzy while standing.

## 2013-07-09 NOTE — ED Notes (Signed)
Discontinued pts iv 

## 2013-07-09 NOTE — ED Notes (Signed)
Per EMS, pt fell backwards yesterday on concrete and hit back of head.  Family did not seek treatment at that time.  Today pt was utilizing walker to ambulate at home and fell forwards on knees onto thick carpet.  Pt denies any pain.  Family reports to EMS that pt has dementia and her gait seemed off.

## 2013-07-09 NOTE — ED Provider Notes (Addendum)
CSN: [id number]     Arrival date & time 07/09/13  1148 History     First MD Initiated Contact with Patient 07/09/13 1152     Chief Complaint  Patient presents with  . Fall   (Consider location/radiation/quality/duration/timing/severity/associated sxs/prior Treatment) HPI Comments: Andrea [name] is a 79 y.o. Female presents for evaluation of a fall. Fall occurred today, while walking, in her home when Andrea [name] fell forward, striking her knees. Andrea [name] currently fell yesterday and struck the back of her head. Andrea [name] was not evaluated after the fall yesterday. Andrea [name] has no other complaints. Andrea [name] reports that Andrea [name] is eating well. Family members told EMS that they were worried that the fall yesterday, caused  the fall today.There are no other known modifying factors.  The history is provided by the patient.    Past Medical History  Diagnosis Date  . COPD (chronic obstructive pulmonary disease)   . Alzheimer's dementia   . Depression   . Chronic kidney disease   . Hypertension    History reviewed. No pertinent past surgical history. Family History  Problem Relation Age of Onset  . Suicidality Other    History  Substance Use Topics  . Smoking status: Never Smoker   . Smokeless tobacco: Not on file  . Alcohol Use: No   OB History   Grav Para Term Preterm Abortions TAB SAB Ect Mult Living                 Review of Systems  All other systems reviewed and are negative.    Allergies  Aspirin  Home Medications   Current Outpatient Rx  Name  Route  Sig  Dispense  Refill  . buPROPion (WELLBUTRIN XL) 300 MG 24 hr tablet   Oral   Take 300 mg by mouth daily.         . clonazePAM (KLONOPIN) 0.5 MG tablet   Oral   Take 0.25 mg by mouth 2 (two) times daily as needed for anxiety.         . donepezil (ARICEPT) 10 MG tablet   Oral   Take 10 mg by mouth daily.         Marland Kitchen escitalopram (LEXAPRO) 20 MG tablet   Oral   Take 20 mg by mouth daily.         Marland Kitchen  losartan-hydrochlorothiazide (HYZAAR) 100-12.5 MG per tablet   Oral   Take 1 tablet by mouth daily.         Marland Kitchen tiotropium (SPIRIVA) 18 MCG inhalation capsule   Inhalation   Place 18 mcg into inhaler and inhale daily.          BP 154/60  Pulse 69  Temp(Src) 97.5 F (36.4 C) (Rectal)  Resp 23  SpO2 95% Physical Exam  Nursing note and vitals reviewed. Constitutional: Andrea [name] is oriented to person, place, and time. Andrea [name] appears well-developed.  Elderly, frail  HENT:  Head: Normocephalic.  Contusion and abrasion and occiput, no bleeding or associated deformity  Eyes: Conjunctivae and EOM are normal. Pupils are equal, round, and reactive to light.  Neck: Normal range of motion and phonation normal. Neck supple.  Cardiovascular: Normal rate, regular rhythm and intact distal pulses.   Pulmonary/Chest: Effort normal and breath sounds normal. Andrea [name] exhibits no tenderness.  Abdominal: Soft. Andrea [name] exhibits no distension. There is no tenderness. There is no guarding.  Musculoskeletal: Normal range of motion. Andrea [name] exhibits no edema and no tenderness.  Normal active range of motion, neck and back  Neurological:  Andrea [name] is alert and oriented to person, place, and time. Andrea [name] has normal strength. Andrea [name] exhibits normal muscle tone.  Skin: Skin is warm and dry.  Psychiatric: Andrea [name] has a normal mood and affect. Her behavior is normal. Judgment and thought content normal.    ED Course   Procedures (including critical care time)  Patient Vitals for the past 24 hrs:  BP Temp Temp src Pulse Resp SpO2  07/09/13 1630 - - - 69 23 95 %  07/09/13 1615 - - - 73 22 96 %  07/09/13 1600 154/60 mmHg - - 64 10 97 %  07/09/13 1530 146/60 mmHg - - 62 15 96 %  07/09/13 1515 - - - 65 12 95 %  07/09/13 1500 142/59 mmHg - - 65 11 97 %  07/09/13 1445 - - - 66 13 98 %  07/09/13 1430 138/65 mmHg - - 63 20 96 %  07/09/13 1415 - - - 59 17 94 %  07/09/13 1400 123/50 mmHg - - 66 17 94 %  07/09/13 1345 - - - 66 15 98 %  07/09/13  1330 153/65 mmHg - - 62 9 99 %  07/09/13 1326 119/70 mmHg - - 86 - -  07/09/13 1322 140/67 mmHg - - 69 - -  07/09/13 1320 147/68 mmHg - - 65 11 100 %  07/09/13 1315 - - - 71 17 100 %  07/09/13 1300 140/64 mmHg - - 64 11 89 %  07/09/13 1245 - - - 65 13 99 %  07/09/13 1206 142/61 mmHg 97.5 F (36.4 C) Rectal 63 13 98 %  07/09/13 1152 - - - - - 97 %     3:37 PM Reevaluation with update and discussion. After initial assessment and treatment, an updated evaluation reveals PE, unchanged. Andrea [name] is hungry. Edge Mauger L    Date: 07/09/13  Rate: 62  Rhythm: normal sinus rhythm  QRS Axis: normal  PR and QT Intervals: normal  ST/T Wave abnormalities: normal  PR and QRS Conduction Disutrbances:none  Narrative Interpretation:   Old EKG Reviewed: unchanged- 06/17/13   Labs Reviewed  CBC WITH DIFFERENTIAL - Abnormal; Notable for the following:    RBC 3.83 (*)    HCT 35.0 (*)    All other components within normal limits  BASIC METABOLIC PANEL - Abnormal; Notable for the following:    Sodium 132 (*)    Chloride 95 (*)    BUN 25 (*)    Creatinine, Ser 1.43 (*)    GFR calc non Af Amer 31 (*)    GFR calc Af Amer 36 (*)    All other components within normal limits  URINE CULTURE  URINALYSIS, ROUTINE W REFLEX MICROSCOPIC   Ct Head Wo Contrast  07/09/2013   *RADIOLOGY REPORT*  Clinical Data: Fall, head injury  CT HEAD WITHOUT CONTRAST  Technique:  Contiguous axial images were obtained from the base of the skull through the vertex without contrast.  Comparison: 03/15/10  Findings: There is no abnormal attenuation to suggest hemorrhage, infarct, mass, or extra-axial fluid.  There is diffuse age related atrophy.  The there is mild age appropriate low attenuation in the deep white matter.  There is no hydrocephalus.  There is mild inflammatory change in the sphenoid sinuses, new from 03/15/2010.  IMPRESSION: No acute traumatic injury.Mild inflammatory change in the sphenoid sinuses.   Original Report  Authenticated By: Esperanza Heir, M.D.   1. Fall, initial encounter   2. Head injuries, initial encounter  MDM  Fall, without clear cause and without severe injury. Screen for causes of falling, including UTI, significant volume depletion, and metabolic instability, are all negative. Andrea [name] may be mildly dehydrated. Andrea [name] is hungry in the ED, and tolerated oral nutrition.  Nursing Notes Reviewed/ Care Coordinated, and agree without changes. Applicable Imaging Reviewed.  Interpretation of Laboratory Data incorporated into ED treatment   Plan: Home Medications- usual; Home Treatments and Observation- rest, watch for progressive symptoms; return here if the recommended treatment, does not improve the symptoms; Recommended follow up- PCP, when necessary   Flint Melter, MD 07/09/13 1539  Flint Melter, MD 07/09/13 [phone]

## 2013-07-09 NOTE — ED Notes (Signed)
Pt attempted to provide urine sample using the bedpan. Pt was not able to go at this time. Will try again later.

## 2013-07-09 NOTE — ED Notes (Signed)
Patient transported to CT 

## 2013-07-09 NOTE — ED Notes (Signed)
[phone], pt's daughter.  Please call if ready for d/c or if needs CT scan.

## 2013-07-09 NOTE — ED Notes (Signed)
Waiting on family member to pick up pt for discharge

## 2013-07-10 LAB — URINE CULTURE: Colony Count: NO GROWTH

## 2013-09-03 ENCOUNTER — Other Ambulatory Visit (HOSPITAL_COMMUNITY): Payer: Self-pay | Admitting: Psychiatry

## 2014-08-25 ENCOUNTER — Inpatient Hospital Stay (HOSPITAL_COMMUNITY)
Admission: EM | Admit: 2014-08-25 | Discharge: 2014-08-28 | DRG: 552 | Disposition: A | Discharge status: Skilled Nursing Facility | Payer: Medicare Other | Attending: Internal Medicine | Admitting: Internal Medicine

## 2014-08-25 ENCOUNTER — Encounter (HOSPITAL_COMMUNITY): Payer: Self-pay | Admitting: Emergency Medicine

## 2014-08-25 ENCOUNTER — Emergency Department (HOSPITAL_COMMUNITY): Payer: Medicare Other

## 2014-08-25 DIAGNOSIS — Z886 Allergy status to analgesic agent status: Secondary | ICD-10-CM | POA: Diagnosis not present

## 2014-08-25 DIAGNOSIS — Z79899 Other long term (current) drug therapy: Secondary | ICD-10-CM

## 2014-08-25 DIAGNOSIS — J449 Chronic obstructive pulmonary disease, unspecified: Secondary | ICD-10-CM | POA: Diagnosis present

## 2014-08-25 DIAGNOSIS — S32019A Unspecified fracture of first lumbar vertebra, initial encounter for closed fracture: Secondary | ICD-10-CM | POA: Diagnosis present

## 2014-08-25 DIAGNOSIS — I129 Hypertensive chronic kidney disease with stage 1 through stage 4 chronic kidney disease, or unspecified chronic kidney disease: Secondary | ICD-10-CM | POA: Diagnosis not present

## 2014-08-25 DIAGNOSIS — W010XXA Fall on same level from slipping, tripping and stumbling without subsequent striking against object, initial encounter: Secondary | ICD-10-CM | POA: Diagnosis present

## 2014-08-25 DIAGNOSIS — G309 Alzheimer's disease, unspecified: Secondary | ICD-10-CM | POA: Diagnosis present

## 2014-08-25 DIAGNOSIS — E860 Dehydration: Secondary | ICD-10-CM | POA: Diagnosis not present

## 2014-08-25 DIAGNOSIS — F0280 Dementia in other diseases classified elsewhere without behavioral disturbance: Secondary | ICD-10-CM | POA: Diagnosis present

## 2014-08-25 DIAGNOSIS — Z66 Do not resuscitate: Secondary | ICD-10-CM | POA: Diagnosis present

## 2014-08-25 DIAGNOSIS — F0391 Unspecified dementia with behavioral disturbance: Secondary | ICD-10-CM | POA: Diagnosis present

## 2014-08-25 DIAGNOSIS — F0390 Unspecified dementia without behavioral disturbance: Secondary | ICD-10-CM

## 2014-08-25 DIAGNOSIS — F39 Unspecified mood [affective] disorder: Secondary | ICD-10-CM | POA: Diagnosis present

## 2014-08-25 DIAGNOSIS — N189 Chronic kidney disease, unspecified: Secondary | ICD-10-CM | POA: Diagnosis present

## 2014-08-25 DIAGNOSIS — S32029A Unspecified fracture of second lumbar vertebra, initial encounter for closed fracture: Secondary | ICD-10-CM | POA: Diagnosis present

## 2014-08-25 DIAGNOSIS — R296 Repeated falls: Secondary | ICD-10-CM

## 2014-08-25 DIAGNOSIS — I951 Orthostatic hypotension: Secondary | ICD-10-CM | POA: Diagnosis present

## 2014-08-25 DIAGNOSIS — F03918 Unspecified dementia, unspecified severity, with other behavioral disturbance: Secondary | ICD-10-CM | POA: Diagnosis present

## 2014-08-25 DIAGNOSIS — F329 Major depressive disorder, single episode, unspecified: Secondary | ICD-10-CM | POA: Diagnosis not present

## 2014-08-25 DIAGNOSIS — I10 Essential (primary) hypertension: Secondary | ICD-10-CM | POA: Diagnosis present

## 2014-08-25 HISTORY — DX: Unspecified fracture of first lumbar vertebra, initial encounter for closed fracture: S32.019A

## 2014-08-25 LAB — CBC WITH DIFFERENTIAL/PLATELET
Basophils Absolute: 0 10*3/uL (ref 0.0–0.1)
Basophils Relative: 0 % (ref 0–1)
EOS ABS: 0 10*3/uL (ref 0.0–0.7)
Eosinophils Relative: 0 % (ref 0–5)
HCT: 36.2 % (ref 36.0–46.0)
HEMOGLOBIN: 12.1 g/dL (ref 12.0–15.0)
LYMPHS ABS: 3.3 10*3/uL (ref 0.7–4.0)
Lymphocytes Relative: 34 % (ref 12–46)
MCH: 30.6 pg (ref 26.0–34.0)
MCHC: 33.4 g/dL (ref 30.0–36.0)
MCV: 91.4 fL (ref 78.0–100.0)
MONO ABS: 0.9 10*3/uL (ref 0.1–1.0)
MONOS PCT: 9 % (ref 3–12)
Neutro Abs: 5.5 10*3/uL (ref 1.7–7.7)
Neutrophils Relative %: 57 % (ref 43–77)
PLATELETS: 195 10*3/uL (ref 150–400)
RBC: 3.96 MIL/uL (ref 3.87–5.11)
RDW: 13.7 % (ref 11.5–15.5)
WBC: 9.8 10*3/uL (ref 4.0–10.5)

## 2014-08-25 LAB — BASIC METABOLIC PANEL
Anion gap: 12 (ref 5–15)
BUN: 31 mg/dL — AB (ref 6–23)
CALCIUM: 9.3 mg/dL (ref 8.4–10.5)
CO2: 24 meq/L (ref 19–32)
Chloride: 102 mEq/L (ref 96–112)
Creatinine, Ser: 1.24 mg/dL — ABNORMAL HIGH (ref 0.50–1.10)
GFR calc Af Amer: 42 mL/min — ABNORMAL LOW (ref >90)
GFR calc non Af Amer: 36 mL/min — ABNORMAL LOW (ref >90)
GLUCOSE: 103 mg/dL — AB (ref 70–99)
Potassium: 4 mEq/L (ref 3.7–5.3)
Sodium: 138 mEq/L (ref 137–147)

## 2014-08-25 LAB — URINALYSIS, ROUTINE W REFLEX MICROSCOPIC
BILIRUBIN URINE: NEGATIVE
GLUCOSE, UA: NEGATIVE mg/dL
Hgb urine dipstick: NEGATIVE
Ketones, ur: NEGATIVE mg/dL
Leukocytes, UA: NEGATIVE
Nitrite: NEGATIVE
Protein, ur: NEGATIVE mg/dL
SPECIFIC GRAVITY, URINE: 1.019 (ref 1.005–1.030)
UROBILINOGEN UA: 0.2 mg/dL (ref 0.0–1.0)
pH: 6 (ref 5.0–8.0)

## 2014-08-25 LAB — TROPONIN I: Troponin I: <0.3 ng/mL (ref <0.30)

## 2014-08-25 LAB — LACTIC ACID, PLASMA: Lactic Acid, Venous: 0.9 mmol/L (ref 0.5–2.2)

## 2014-08-25 MED ORDER — ACETAMINOPHEN 325 MG PO TABS: 650.0000 mg | ORAL_TABLET | Freq: Four times a day (QID) | ORAL | Status: DC | PRN

## 2014-08-25 MED ORDER — TRAMADOL-ACETAMINOPHEN 37.5-325 MG PO TABS
1.0000 | ORAL_TABLET | Freq: Three times a day (TID) | ORAL | Status: DC | PRN
  Administered 2014-08-26: 1 via ORAL
  Filled 2014-08-25: qty 1

## 2014-08-25 MED ORDER — SODIUM CHLORIDE 0.9 % IV SOLN
INTRAVENOUS | Status: DC
  Administered 2014-08-25: 19:00:00 via INTRAVENOUS

## 2014-08-25 MED ORDER — LOSARTAN POTASSIUM 50 MG PO TABS
100.0000 mg | ORAL_TABLET | Freq: Every day | ORAL | Status: DC
  Administered 2014-08-25 – 2014-08-28 (×4): 100 mg via ORAL
  Filled 2014-08-25 (×4): qty 2

## 2014-08-25 MED ORDER — ONDANSETRON HCL 4 MG PO TABS: 4.0000 mg | ORAL_TABLET | Freq: Four times a day (QID) | ORAL | Status: DC | PRN

## 2014-08-25 MED ORDER — BUPROPION HCL ER (XL) 300 MG PO TB24
300.0000 mg | ORAL_TABLET | Freq: Every evening | ORAL | Status: DC
Start: 2014-08-25 — End: 2014-08-28
  Administered 2014-08-25 – 2014-08-27 (×3): 300 mg via ORAL
  Filled 2014-08-25 (×4): qty 1

## 2014-08-25 MED ORDER — ONDANSETRON HCL 4 MG/2ML IJ SOLN: 4.0000 mg | Freq: Four times a day (QID) | INTRAMUSCULAR | Status: DC | PRN

## 2014-08-25 MED ORDER — HEPARIN SODIUM (PORCINE) 5000 UNIT/ML IJ SOLN
5000.0000 [IU] | Freq: Three times a day (TID) | INTRAMUSCULAR | Status: DC
  Administered 2014-08-25 – 2014-08-28 (×9): 5000 [IU] via SUBCUTANEOUS
  Filled 2014-08-25 (×8): qty 1

## 2014-08-25 MED ORDER — LIDOCAINE 5 % EX PTCH
1.0000 | MEDICATED_PATCH | CUTANEOUS | Status: DC
  Administered 2014-08-25 – 2014-08-27 (×3): 1 via TRANSDERMAL
  Filled 2014-08-25 (×2): qty 1

## 2014-08-25 MED ORDER — TIOTROPIUM BROMIDE MONOHYDRATE 18 MCG IN CAPS
18.0000 ug | ORAL_CAPSULE | Freq: Every day | RESPIRATORY_TRACT | Status: DC
  Administered 2014-08-26 – 2014-08-28 (×3): 18 ug via RESPIRATORY_TRACT
  Filled 2014-08-25: qty 5

## 2014-08-25 MED ORDER — SODIUM CHLORIDE 0.9 % IV SOLN: INTRAVENOUS | Status: DC

## 2014-08-25 MED ORDER — SODIUM CHLORIDE 0.9 % IJ SOLN
3.0000 mL | Freq: Two times a day (BID) | INTRAMUSCULAR | Status: DC
  Administered 2014-08-26 – 2014-08-28 (×4): 3 mL via INTRAVENOUS

## 2014-08-25 MED ORDER — MEMANTINE HCL 10 MG PO TABS
10.0000 mg | ORAL_TABLET | Freq: Two times a day (BID) | ORAL | Status: DC
  Administered 2014-08-25 – 2014-08-28 (×6): 10 mg via ORAL
  Filled 2014-08-25 (×7): qty 1

## 2014-08-25 MED ORDER — CLONAZEPAM 0.5 MG PO TABS
0.2500 mg | ORAL_TABLET | Freq: Two times a day (BID) | ORAL | Status: DC | PRN
  Administered 2014-08-27: 0.25 mg via ORAL
  Filled 2014-08-25: qty 1

## 2014-08-25 MED ORDER — ACETAMINOPHEN 325 MG PO TABS
650.0000 mg | ORAL_TABLET | Freq: Once | ORAL | Status: AC
  Administered 2014-08-25: 650 mg via ORAL
  Filled 2014-08-25: qty 2

## 2014-08-25 MED ORDER — ESCITALOPRAM OXALATE 10 MG PO TABS
20.0000 mg | ORAL_TABLET | Freq: Every day | ORAL | Status: DC
  Administered 2014-08-25 – 2014-08-28 (×4): 20 mg via ORAL
  Filled 2014-08-25 (×4): qty 2

## 2014-08-25 MED ORDER — ACETAMINOPHEN 650 MG RE SUPP: 650.0000 mg | Freq: Four times a day (QID) | RECTAL | Status: DC | PRN

## 2014-08-25 NOTE — ED Notes (Signed)
Pt ambulated well with assistance from staff.

## 2014-08-25 NOTE — ED Provider Notes (Signed)
CSN: [id number]     Arrival date & time 08/25/14  1713 History   First MD Initiated Contact with Patient 08/25/14 1737     Chief Complaint  Patient presents with  . Fall     Patient is a 80 y.o. female presenting with fall. The history is provided by a caregiver and the patient. The history is limited by the condition of the patient (Hx dementia).  Fall  Pt was seen at 1750. Per pt and her family, c/o gradual onset and worsening of constant generalized weakness for the past 3 days. Pt fell onto her buttocks while hanging clothes 3 days ago. Pt has since fallen twice yesterday and once today. Pt's family states pt "just stands up and then falls down." Pt c/o lower back "soreness" and "I feel weak."  Denies fevers, no syncope, no focal motor weakness, no CP/palpitations, no SOB/cough, no abd pain, no N/V/D, no fevers.    Past Medical History  Diagnosis Date  . COPD (chronic obstructive pulmonary disease)   . Alzheimer's dementia   . Depression   . Chronic kidney disease   . Hypertension    History reviewed. No pertinent past surgical history.  Family History  Problem Relation Age of Onset  . Suicidality Other    History  Substance Use Topics  . Smoking status: Never Smoker   . Smokeless tobacco: Not on file  . Alcohol Use: No    Review of Systems  Unable to perform ROS: Dementia      Allergies  Aspirin  Home Medications   Prior to Admission medications   Medication Sig Start Date End Date Taking? Authorizing Provider  buPROPion (WELLBUTRIN XL) 300 MG 24 hr tablet Take 300 mg by mouth every evening.    Yes Historical Provider, MD  clonazePAM (KLONOPIN) 0.5 MG tablet Take 0.25 mg by mouth 2 (two) times daily as needed for anxiety.   Yes Historical Provider, MD  escitalopram (LEXAPRO) 20 MG tablet Take 20 mg by mouth daily.   Yes Historical Provider, MD  ibuprofen (ADVIL,MOTRIN) 200 MG tablet Take 200 mg by mouth every 6 (six) hours as needed for moderate pain.   Yes  Historical Provider, MD  losartan-hydrochlorothiazide (HYZAAR) 100-12.5 MG per tablet Take 1 tablet by mouth daily.   Yes Historical Provider, MD  memantine (NAMENDA) 10 MG tablet Take 10 mg by mouth 2 (two) times daily.   Yes Historical Provider, MD  tiotropium (SPIRIVA) 18 MCG inhalation capsule Place 18 mcg into inhaler and inhale daily.   Yes Historical Provider, MD   BP 179/81  Pulse 76  Temp(Src) 98.7 F (37.1 C) (Oral)  Resp 18  Ht 5\' 6"  (1.676 m)  Wt 110 lb (49.896 kg)  BMI 17.76 kg/m2  SpO2 94% Physical Exam 1755: Physical examination:  Nursing notes reviewed; Vital signs and O2 SAT reviewed;  Constitutional: Well developed, Well nourished, In no acute distress; Head:  Normocephalic, atraumatic; Eyes: EOMI, PERRL, No scleral icterus; ENMT: Mouth and pharynx normal, Mucous membranes dry; Neck: Supple, Full range of motion, No lymphadenopathy; Cardiovascular: Regular rate and rhythm, No gallop; Respiratory: Breath sounds clear & equal bilaterally, No wheezes.  Speaking full sentences with ease, Normal respiratory effort/excursion; Chest: Nontender, Movement normal; Abdomen: Soft, Nontender, Nondistended, Normal bowel sounds; Genitourinary: No CVA tenderness; Spine:  No midline CS, TS, LS tenderness. +TTP bilat lumbar paraspinal muscles. No rash, no abrasions, no ecchymosis.;; Extremities: Pulses normal, Pelvis stable. No deformity. No tenderness, No edema, No calf edema or asymmetry.;  Neuro: Awake, alert, confused per hx dementia. No facial droop. Major CN grossly intact.  Speech clear. Moves all extremities spontaneously and to command without apparent gross focal motor deficits. Pt is able to lift extended bilat LE's up off stretcher without discomfort.; Skin: Color normal, Warm, Dry.   ED Course  Procedures     EKG Interpretation   Date/Time:  Monday August 25 2014 17:20:24 EDT Ventricular Rate:  75 PR Interval:  171 QRS Duration: 74 QT Interval:  435 QTC Calculation: 486 R  Axis:   58 Text Interpretation:  Sinus rhythm Borderline prolonged QT interval  Baseline wander When compared with ECG of 06/17/2013 QT has lengthened and   PR interval has shortened Otherwise no significant change Confirmed by  [facility]  MD, Nicholos Johns [phone]) on 08/25/2014 7:05:57 PM      MDM  MDM Reviewed: previous chart, nursing note and vitals Reviewed previous: labs and ECG Interpretation: labs, ECG, x-ray and CT scan    Results for orders placed during the hospital encounter of 08/25/14  URINALYSIS, ROUTINE W REFLEX MICROSCOPIC      Result Value Ref Range   Color, Urine YELLOW  YELLOW   APPearance CLEAR  CLEAR   Specific Gravity, Urine 1.019  1.005 - 1.030   pH 6.0  5.0 - 8.0   Glucose, UA NEGATIVE  NEGATIVE mg/dL   Hgb urine dipstick NEGATIVE  NEGATIVE   Bilirubin Urine NEGATIVE  NEGATIVE   Ketones, ur NEGATIVE  NEGATIVE mg/dL   Protein, ur NEGATIVE  NEGATIVE mg/dL   Urobilinogen, UA 0.2  0.0 - 1.0 mg/dL   Nitrite NEGATIVE  NEGATIVE   Leukocytes, UA NEGATIVE  NEGATIVE  CBC WITH DIFFERENTIAL      Result Value Ref Range   WBC 9.8  4.0 - 10.5 K/uL   RBC 3.96  3.87 - 5.11 MIL/uL   Hemoglobin 12.1  12.0 - 15.0 g/dL   HCT 60.4  54.0 - 98.1 %   MCV 91.4  78.0 - 100.0 fL   MCH 30.6  26.0 - 34.0 pg   MCHC 33.4  30.0 - 36.0 g/dL   RDW 19.1  47.8 - 29.5 %   Platelets 195  150 - 400 K/uL   Neutrophils Relative % 57  43 - 77 %   Neutro Abs 5.5  1.7 - 7.7 K/uL   Lymphocytes Relative 34  12 - 46 %   Lymphs Abs 3.3  0.7 - 4.0 K/uL   Monocytes Relative 9  3 - 12 %   Monocytes Absolute 0.9  0.1 - 1.0 K/uL   Eosinophils Relative 0  0 - 5 %   Eosinophils Absolute 0.0  0.0 - 0.7 K/uL   Basophils Relative 0  0 - 1 %   Basophils Absolute 0.0  0.0 - 0.1 K/uL  BASIC METABOLIC PANEL      Result Value Ref Range   Sodium 138  137 - 147 mEq/L   Potassium 4.0  3.7 - 5.3 mEq/L   Chloride 102  96 - 112 mEq/L   CO2 24  19 - 32 mEq/L   Glucose, Bld 103 (*) 70 - 99 mg/dL   BUN 31 (*) 6 -  23 mg/dL   Creatinine, Ser 6.21 (*) 0.50 - 1.10 mg/dL   Calcium 9.3  8.4 - 30.8 mg/dL   GFR calc non Af Amer 36 (*) >90 mL/min   GFR calc Af Amer 42 (*) >90 mL/min   Anion gap 12  5 - 15  LACTIC ACID, PLASMA      Result Value Ref Range   Lactic Acid, Venous 0.9  0.5 - 2.2 mmol/L  TROPONIN I      Result Value Ref Range   Troponin I <0.30  <0.30 ng/mL   Dg Chest 2 View 08/25/2014   CLINICAL DATA:  Left chest pain following a fall today.  EXAM: CHEST  2 VIEW  COMPARISON:  06/17/2013.  FINDINGS: The heart remains normal in size. The lungs remain hyperexpanded and clear. Old, healed left rib fractures are again demonstrated. No acute fracture or pneumothorax seen. Diffuse osteopenia is noted as well as mid to lower thoracic spine degenerative changes. Right shoulder degenerative changes.  IMPRESSION: No acute abnormality. Stable changes of COPD. If there is a clinical concern for left rib fracture, left rib radiographs would be recommended.   Electronically Signed   By: Gordan Payment M.D.   On: 08/25/2014 18:42   Dg Lumbar Spine Complete 08/25/2014   CLINICAL DATA:  Low back pain following a fall today.  EXAM: LUMBAR SPINE - COMPLETE 4+ VIEW  COMPARISON:  Abdomen radiographs dated 12/05/2004.  FINDINGS: Five non-rib-bearing lumbar vertebrae. Interval approximately 50% compression deformity of the L2 vertebral body with mild bony retropulsion and possible acute fracture lines and fragments anteriorly. No additional fractures. No pars defects. Facet degenerative changes in the mid and lower lumbar spine with associated grade 1 anterolisthesis at the L3-4 level. Atheromatous arterial calcifications.  IMPRESSION: 1. Probably acute L2 vertebral body 50% compression fracture. This could be confirmed with a noncontrast a lumbar spine CT if clinically indicated. 2. Diffuse osteopenia. 3. Degenerative changes.   Electronically Signed   By: Gordan Payment M.D.   On: 08/25/2014 18:45   Dg Pelvis 1-2 Views 08/25/2014    CLINICAL DATA:  Left pelvic pain after falling.  Initial encounter.  EXAM: PELVIS - 1-2 VIEW  COMPARISON:  Abdominal radiographs 12/05/2004.  FINDINGS: The bones are diffusely demineralized. There is no evidence of acute fracture or sacroiliac joint diastasis. There are degenerative changes at the sacroiliac joints, both hips and the symphysis pubis. Radiodensities overlapping the pelvis are likely related to retained contrast within colonic diverticula.  IMPRESSION: No evidence of acute pelvic fracture or dislocation. Degenerative changes as described.   Electronically Signed   By: Roxy Horseman M.D.   On: 08/25/2014 18:42   Ct Head Wo Contrast 08/25/2014   CLINICAL DATA:  Fall.  Head injury.  EXAM: CT HEAD WITHOUT CONTRAST  TECHNIQUE: Contiguous axial images were obtained from the base of the skull through the vertex without intravenous contrast.  COMPARISON:  07/09/2013.  FINDINGS: Low attenuation throughout the subcortical and periventricular white matter noted compatible with chronic small vessel ischemic change. There is prominence of the sulci and ventricles compatible with brain atrophy. There is no evidence for acute intracranial hemorrhage, infarct or mass. The paranasal sinuses appear clear. The mastoid air cells are clear. The skull is intact.  IMPRESSION: 1. No acute intracranial abnormalities. 2. Small vessel ischemic disease and brain atrophy.   Electronically Signed   By: Signa Kell M.D.   On: 08/25/2014 19:06    2015:  Pt orthostatic on VS; will continue judicious IVF. APAP given for pain. Overall exam unchanged. Family very concerned regarding taking pt home with her frequent falling; will call for admission. CT LS pending. Dx and testing d/w pt and family.  Questions answered.  Verb understanding, agreeable to admit. T/C to [facility]. Allena Katz, case discussed, including:  HPI, pertinent PM/SHx, VS/PE, dx testing, ED course and treatment:  Agreeable to admit, requests to write temporary orders,  obtain inpt tele bed to team [id number].   Samuel Jester, DO 08/27/14 1733

## 2014-08-25 NOTE — Progress Notes (Signed)
Received from ED into 4N30, alert responsive, confused as to location [facility]), denies pain.  Tele applied & notified:  NSR with paired PVCs noted at transfer. NS infusing, pump applied.

## 2014-08-25 NOTE — ED Notes (Signed)
Onset 3 days ago trying to hang clothes landed on buttocks and since then general weakness and fell 2 more times last being today. History of dementia.

## 2014-08-25 NOTE — H&P (Signed)
[facility] History and Physical  Patient: Andrea [name]  ZOX:[id number]  DOB: January 22, 1919  DOS: the patient was seen and examined on 08/25/2014 PCP: Thayer Headings, MD  Chief Complaint: Recurrent fall  HPI: Awanda Wilcock is a 80 y.o. female with Past medical history of COPD, dementia, hypertension. The patient is presenting with recurrent fall. She was brought in by the family. As per the daughter who is the primary caregiver for the patient the patient had a fall 3 days ago while she was trying to hang her clothes in the closet. She did complain of back pain after the fall but she was still able to ambulate. There was no head injury at that point. After that the patient had 3 more falls, most of them were while ambulating and due to loss of balance. As per the daughter the patient had history of recurrent fall with one fall nearly every week during the summer and was admitted in [place] while she was there, she also has undergone physical therapy for the same and every fall she has been diagnosed with dehydration. At present daughter mentions no recent change in her medications. Patient denies any focal deficit diarrhea loss of control of bowel or bladder.  The patient is coming from home. And at her baseline independent for most of her ADL.  Review of Systems: as mentioned in the history of present illness.  A Comprehensive review of the other systems is negative.  Past Medical History  Diagnosis Date  . COPD (chronic obstructive pulmonary disease)   . Alzheimer's dementia   . Depression   . Chronic kidney disease   . Hypertension    History reviewed. No pertinent past surgical history. Social History:  reports that she has never smoked. She does not have any smokeless tobacco history on file. She reports that she does not drink alcohol or use illicit drugs.  Allergies  Allergen Reactions  . Aspirin     Stomach upset    Family History  Problem Relation Age of  Onset  . Suicidality Other     Prior to Admission medications   Medication Sig Start Date End Date Taking? Authorizing Provider  buPROPion (WELLBUTRIN XL) 300 MG 24 hr tablet Take 300 mg by mouth every evening.    Yes Historical Provider, MD  clonazePAM (KLONOPIN) 0.5 MG tablet Take 0.25 mg by mouth 2 (two) times daily as needed for anxiety.   Yes Historical Provider, MD  escitalopram (LEXAPRO) 20 MG tablet Take 20 mg by mouth daily.   Yes Historical Provider, MD  ibuprofen (ADVIL,MOTRIN) 200 MG tablet Take 200 mg by mouth every 6 (six) hours as needed for moderate pain.   Yes Historical Provider, MD  losartan-hydrochlorothiazide (HYZAAR) 100-12.5 MG per tablet Take 1 tablet by mouth daily.   Yes Historical Provider, MD  memantine (NAMENDA) 10 MG tablet Take 10 mg by mouth 2 (two) times daily.   Yes Historical Provider, MD  tiotropium (SPIRIVA) 18 MCG inhalation capsule Place 18 mcg into inhaler and inhale daily.   Yes Historical Provider, MD    Physical Exam: Filed Vitals:   08/25/14 1724 08/25/14 1728 08/25/14 2130  BP:  179/81 179/55  Pulse:  76 56  Temp:  98.7 F (37.1 C) 97.5 F (36.4 C)  TempSrc:  Oral Oral  Resp:  18 18  Height:  5\' 6"  (1.676 m) 5\' 6"  (1.676 m)  Weight:  49.896 kg (110 lb) 46.993 kg (103 lb 9.6 oz)  SpO2: 94% 94% 93%  General: Alert, Awake and Oriented to Time, Place and Person. Appear in mild distress Eyes: PERRL ENT: Oral Mucosa clear dry. Neck: no JVD Cardiovascular: S1 and S2 Present, no Murmur, Peripheral Pulses Present Respiratory: Bilateral Air entry equal and Decreased, Clear to Auscultation, noCrackles, no wheezes Abdomen: Bowel Sound present, Soft and Non tender Skin: no Rash Extremities: no Pedal edema, no calf tenderness Neurologic: Grossly no focal neuro deficit.  Labs on Admission:  CBC:  Recent Labs Lab 08/25/14 1758  WBC 9.8  NEUTROABS 5.5  HGB 12.1  HCT 36.2  MCV 91.4  PLT 195    CMP     Component Value Date/Time    NA 138 08/25/2014 1758   K 4.0 08/25/2014 1758   CL 102 08/25/2014 1758   CO2 24 08/25/2014 1758   GLUCOSE 103* 08/25/2014 1758   BUN 31* 08/25/2014 1758   CREATININE 1.24* 08/25/2014 1758   CALCIUM 9.3 08/25/2014 1758   PROT 6.6 06/16/2013 1547   ALBUMIN 4.3 06/16/2013 1547   AST 17 06/16/2013 1547   ALT 10 06/16/2013 1547   ALKPHOS 50 06/16/2013 1547   BILITOT 0.5 06/16/2013 1547   GFRNONAA 36* 08/25/2014 1758   GFRAA 42* 08/25/2014 1758    No results found for this basename: LIPASE, AMYLASE,  in the last 168 hours No results found for this basename: AMMONIA,  in the last 168 hours   Recent Labs Lab 08/25/14 1758  TROPONINI <0.30   BNP (last 3 results) No results found for this basename: PROBNP,  in the last 8760 hours  Radiological Exams on Admission: Dg Chest 2 View  08/25/2014   CLINICAL DATA:  Left chest pain following a fall today.  EXAM: CHEST  2 VIEW  COMPARISON:  06/17/2013.  FINDINGS: The heart remains normal in size. The lungs remain hyperexpanded and clear. Old, healed left rib fractures are again demonstrated. No acute fracture or pneumothorax seen. Diffuse osteopenia is noted as well as mid to lower thoracic spine degenerative changes. Right shoulder degenerative changes.  IMPRESSION: No acute abnormality. Stable changes of COPD. If there is a clinical concern for left rib fracture, left rib radiographs would be recommended.   Electronically Signed   By: Gordan PaymentSteve  Reid M.D.   On: 08/25/2014 18:42   Dg Lumbar Spine Complete  08/25/2014   CLINICAL DATA:  Low back pain following a fall today.  EXAM: LUMBAR SPINE - COMPLETE 4+ VIEW  COMPARISON:  Abdomen radiographs dated 12/05/2004.  FINDINGS: Five non-rib-bearing lumbar vertebrae. Interval approximately 50% compression deformity of the L2 vertebral body with mild bony retropulsion and possible acute fracture lines and fragments anteriorly. No additional fractures. No pars defects. Facet degenerative changes in the mid and lower lumbar  spine with associated grade 1 anterolisthesis at the L3-4 level. Atheromatous arterial calcifications.  IMPRESSION: 1. Probably acute L2 vertebral body 50% compression fracture. This could be confirmed with a noncontrast a lumbar spine CT if clinically indicated. 2. Diffuse osteopenia. 3. Degenerative changes.   Electronically Signed   By: Gordan PaymentSteve  Reid M.D.   On: 08/25/2014 18:45   Dg Pelvis 1-2 Views  08/25/2014   CLINICAL DATA:  Left pelvic pain after falling.  Initial encounter.  EXAM: PELVIS - 1-2 VIEW  COMPARISON:  Abdominal radiographs 12/05/2004.  FINDINGS: The bones are diffusely demineralized. There is no evidence of acute fracture or sacroiliac joint diastasis. There are degenerative changes at the sacroiliac joints, both hips and the symphysis pubis. Radiodensities overlapping the pelvis are likely related to  retained contrast within colonic diverticula.  IMPRESSION: No evidence of acute pelvic fracture or dislocation. Degenerative changes as described.   Electronically Signed   By: Roxy Horseman M.D.   On: 08/25/2014 18:42   Ct Head Wo Contrast  08/25/2014   CLINICAL DATA:  Fall.  Head injury.  EXAM: CT HEAD WITHOUT CONTRAST  TECHNIQUE: Contiguous axial images were obtained from the base of the skull through the vertex without intravenous contrast.  COMPARISON:  07/09/2013.  FINDINGS: Low attenuation throughout the subcortical and periventricular white matter noted compatible with chronic small vessel ischemic change. There is prominence of the sulci and ventricles compatible with brain atrophy. There is no evidence for acute intracranial hemorrhage, infarct or mass. The paranasal sinuses appear clear. The mastoid air cells are clear. The skull is intact.  IMPRESSION: 1. No acute intracranial abnormalities. 2. Small vessel ischemic disease and brain atrophy.   Electronically Signed   By: Signa Kell M.D.   On: 08/25/2014 19:06   Ct Lumbar Spine Wo Contrast  08/25/2014   CLINICAL DATA:  Initial  encounter for 3 falls during the last 3 days. Initial fall was a slip while trying to hang close at home. Patient landed on her buttocks. The fall was on the same level.  EXAM: CT LUMBAR SPINE WITHOUT CONTRAST  TECHNIQUE: Multidetector CT imaging of the lumbar spine was performed without intravenous contrast administration. Multiplanar CT image reconstructions were also generated.  COMPARISON:  Lumbar spine radiographs from the same day.  FINDINGS: CT confirms an acute superior endplate fracture of L2. This is a burst fracture with posterior displaced fragments 4 mm from the expected wall of the posterior vertebral body is encroaches into the spinal canal without significant stenosis. The fractures extend into the pedicle bilaterally. Remaining vertebral bodies are intact. No additional fractures are present. Alignment is anatomic. Facet degenerative changes are present.  Limited imaging of the abdomen demonstrates significant atrophy of the left kidney. Atherosclerotic calcifications are present in the aorta without aneurysm. Degenerative changes are noted at the SI joints bilaterally.  IMPRESSION: 1. Burst fracture of the superior endplate of L2. Bone fragments are retropulsed 4 mm into the canal without significant stenosis. 2. Fractures extend through the pedicles bilaterally. 3. Degenerative changes through the lumbar spine without additional fracture. 4. Severe atrophy of the left kidney. 5. Atherosclerosis   Electronically Signed   By: Gennette Pac M.D.   On: 08/25/2014 21:14    Assessment/Plan Principal Problem:   Dehydration Active Problems:   L1 vertebral fracture   Essential hypertension   Orthostatic hypotension   Dementia without behavioral disturbance   Mood disorder   Recurrent falls   1. Dehydration The patient is presenting with complaints of recurrent fall ongoing since last 3-4 days. She is taking her medication on regular basis but is not eating or drinking significantly since  last 3-4 days due to pain limiting her mobility. With this at present but has mild elevation in serum creatinine and patient appears dehydrated. She also has drop in her blood pressure and orthostatic. Therefore I would admit her in the hospital and continue her with IV hydration and hold of her hydrochlorothiazide and recommended to discontinue it completely. Continue with losartan at present. Diet as tolerated. Monitor BMP  2. L1 fracture. Patient is found to have L1 compression fracture. CT scan is showing an inotrope was on without any stenosis. On exam patient does not have any radicular symptoms. In discussing PTOT as well as neurosurgery probably  she will need a TLSO brace. With her comorbidities and age she would be a high-risk candidate for any surgical intervention, but may tolerate vertebral kyphoplasty if recommended by neurosurgery.   3.Recurrent fall. Probably due to orthostatic. At present recommend to discontinue her hydrochlorothiazide.  4.Dementia. Patient does not have any behavioral issues at present. Continue home medications. Monitor for delirium  DVT Prophylaxis: subcutaneous Heparin Nutrition: Cardiac diet  Code Status: DNR/DNI  Family Communication: Daughter  was present at bedside, opportunity was given to ask question and all questions were answered satisfactorily at the time of interview. Disposition: Admitted to inpatient med-surge unit.  Author: Lynden Oxford, MD [facility] Pager: [phone] 08/25/2014, 11:17 PM    If 7PM-7AM, please contact night-coverage [email or web address] Password [email or web address]

## 2014-08-25 NOTE — ED Notes (Signed)
Admitting at bedside 

## 2014-08-26 DIAGNOSIS — F03918 Unspecified dementia, unspecified severity, with other behavioral disturbance: Secondary | ICD-10-CM

## 2014-08-26 DIAGNOSIS — R296 Repeated falls: Secondary | ICD-10-CM

## 2014-08-26 DIAGNOSIS — F0391 Unspecified dementia with behavioral disturbance: Secondary | ICD-10-CM

## 2014-08-26 DIAGNOSIS — I951 Orthostatic hypotension: Secondary | ICD-10-CM

## 2014-08-26 DIAGNOSIS — S32019A Unspecified fracture of first lumbar vertebra, initial encounter for closed fracture: Secondary | ICD-10-CM

## 2014-08-26 DIAGNOSIS — F39 Unspecified mood [affective] disorder: Secondary | ICD-10-CM | POA: Insufficient documentation

## 2014-08-26 DIAGNOSIS — I10 Essential (primary) hypertension: Secondary | ICD-10-CM | POA: Diagnosis present

## 2014-08-26 HISTORY — DX: Unspecified dementia with behavioral disturbance: F03.91

## 2014-08-26 HISTORY — DX: Unspecified dementia, unspecified severity, with other behavioral disturbance: F03.918

## 2014-08-26 HISTORY — DX: Orthostatic hypotension: I95.1

## 2014-08-26 LAB — CBC
HCT: 33.9 % — ABNORMAL LOW (ref 36.0–46.0)
HEMOGLOBIN: 11.4 g/dL — AB (ref 12.0–15.0)
MCH: 30.6 pg (ref 26.0–34.0)
MCHC: 33.6 g/dL (ref 30.0–36.0)
MCV: 91.1 fL (ref 78.0–100.0)
Platelets: 183 10*3/uL (ref 150–400)
RBC: 3.72 MIL/uL — ABNORMAL LOW (ref 3.87–5.11)
RDW: 13.7 % (ref 11.5–15.5)
WBC: 8.2 10*3/uL (ref 4.0–10.5)

## 2014-08-26 LAB — URINE CULTURE
Colony Count: NO GROWTH
Culture: NO GROWTH

## 2014-08-26 LAB — COMPREHENSIVE METABOLIC PANEL
ALBUMIN: 3.3 g/dL — AB (ref 3.5–5.2)
ALT: 16 U/L (ref 0–35)
AST: 22 U/L (ref 0–37)
Alkaline Phosphatase: 54 U/L (ref 39–117)
Anion gap: 11 (ref 5–15)
BUN: 28 mg/dL — ABNORMAL HIGH (ref 6–23)
CO2: 23 mEq/L (ref 19–32)
Calcium: 8.7 mg/dL (ref 8.4–10.5)
Chloride: 106 mEq/L (ref 96–112)
Creatinine, Ser: 1.16 mg/dL — ABNORMAL HIGH (ref 0.50–1.10)
GFR calc Af Amer: 46 mL/min — ABNORMAL LOW (ref >90)
GFR calc non Af Amer: 39 mL/min — ABNORMAL LOW (ref >90)
Glucose, Bld: 93 mg/dL (ref 70–99)
POTASSIUM: 3.5 meq/L — AB (ref 3.7–5.3)
SODIUM: 140 meq/L (ref 137–147)
Total Bilirubin: 0.6 mg/dL (ref 0.3–1.2)
Total Protein: 5.6 g/dL — ABNORMAL LOW (ref 6.0–8.3)

## 2014-08-26 MED ORDER — TRAMADOL-ACETAMINOPHEN 37.5-325 MG PO TABS: 1.0000 | ORAL_TABLET | Freq: Three times a day (TID) | ORAL | Status: DC | PRN

## 2014-08-26 MED ORDER — CYCLOBENZAPRINE HCL 5 MG PO TABS: 5.0000 mg | ORAL_TABLET | Freq: Three times a day (TID) | ORAL | Status: DC | PRN

## 2014-08-26 MED ORDER — SODIUM CHLORIDE 0.9 % IV SOLN
INTRAVENOUS | Status: AC
  Administered 2014-08-26: 15:00:00 via INTRAVENOUS

## 2014-08-26 MED ORDER — INFLUENZA VAC SPLIT QUAD 0.5 ML IM SUSY
0.5000 mL | PREFILLED_SYRINGE | INTRAMUSCULAR | Status: AC
  Administered 2014-08-27: 0.5 mL via INTRAMUSCULAR
  Filled 2014-08-26 (×2): qty 0.5

## 2014-08-26 MED ORDER — ACETAMINOPHEN 325 MG PO TABS: 650.0000 mg | ORAL_TABLET | Freq: Four times a day (QID) | ORAL | Status: DC | PRN

## 2014-08-26 NOTE — Discharge Summary (Signed)
Physician Discharge Summary  Kanija Remmel MRN: [id number] DOB/AGE: October 15, 1919 80 y.o.  PCP: Thressa Sheller, MD   Admit date: 08/25/2014 Discharge date: 08/26/2014  Discharge Diagnoses:      Dehydration L 2 fracture after a fall   L1 vertebral fracture   Essential hypertension   Orthostatic hypotension   Dementia without behavioral disturbance   Mood disorder   Recurrent falls  Followup recommendations Follow up with Dr. Lenn Cal in one month with repeat AP and Lateral lumbar radiographs. Follow up with PCP in 5-7 days fitted with a TLSO brace       Medication List    STOP taking these medications       ibuprofen 200 MG tablet  Commonly known as:  ADVIL,MOTRIN      TAKE these medications       acetaminophen 325 MG tablet  Commonly known as:  TYLENOL  Take 2 tablets (650 mg total) by mouth every 6 (six) hours as needed for mild pain or moderate pain (or Fever >/= 101).     buPROPion 300 MG 24 hr tablet  Commonly known as:  WELLBUTRIN XL  Take 300 mg by mouth every evening.     clonazePAM 0.5 MG tablet  Commonly known as:  KLONOPIN  Take 0.25 mg by mouth 2 (two) times daily as needed for anxiety.     cyclobenzaprine 5 MG tablet  Commonly known as:  FLEXERIL  Take 1 tablet (5 mg total) by mouth 3 (three) times daily as needed for muscle spasms.     escitalopram 20 MG tablet  Commonly known as:  LEXAPRO  Take 20 mg by mouth daily.     losartan-hydrochlorothiazide 100-12.5 MG per tablet  Commonly known as:  HYZAAR  Take 1 tablet by mouth daily.     memantine 10 MG tablet  Commonly known as:  NAMENDA  Take 10 mg by mouth 2 (two) times daily.     tiotropium 18 MCG inhalation capsule  Commonly known as:  SPIRIVA  Place 18 mcg into inhaler and inhale daily.     traMADol-acetaminophen 37.5-325 MG per tablet  Commonly known as:  ULTRACET  Take 1 tablet by mouth every 8 (eight) hours as needed for moderate pain.        Discharge  Condition: Stable   Disposition: 01-Home or Self Care   Consults Neurosurgery  Significant Diagnostic Studies: Dg Chest 2 View  08/25/2014   CLINICAL DATA:  Left chest pain following a fall today.  EXAM: CHEST  2 VIEW  COMPARISON:  06/17/2013.  FINDINGS: The heart remains normal in size. The lungs remain hyperexpanded and clear. Old, healed left rib fractures are again demonstrated. No acute fracture or pneumothorax seen. Diffuse osteopenia is noted as well as mid to lower thoracic spine degenerative changes. Right shoulder degenerative changes.  IMPRESSION: No acute abnormality. Stable changes of COPD. If there is a clinical concern for left rib fracture, left rib radiographs would be recommended.   Electronically Signed   By: Enrique Sack M.D.   On: 08/25/2014 18:42   Dg Lumbar Spine Complete  08/25/2014   CLINICAL DATA:  Low back pain following a fall today.  EXAM: LUMBAR SPINE - COMPLETE 4+ VIEW  COMPARISON:  Abdomen radiographs dated 12/05/2004.  FINDINGS: Five non-rib-bearing lumbar vertebrae. Interval approximately 50% compression deformity of the L2 vertebral body with mild bony retropulsion and possible acute fracture lines and fragments anteriorly. No additional fractures. No pars defects. Facet degenerative changes in the  mid and lower lumbar spine with associated grade 1 anterolisthesis at the L3-4 level. Atheromatous arterial calcifications.  IMPRESSION: 1. Probably acute L2 vertebral body 50% compression fracture. This could be confirmed with a noncontrast a lumbar spine CT if clinically indicated. 2. Diffuse osteopenia. 3. Degenerative changes.   Electronically Signed   By: Enrique Sack M.D.   On: 08/25/2014 18:45   Dg Pelvis 1-2 Views  08/25/2014   CLINICAL DATA:  Left pelvic pain after falling.  Initial encounter.  EXAM: PELVIS - 1-2 VIEW  COMPARISON:  Abdominal radiographs 12/05/2004.  FINDINGS: The bones are diffusely demineralized. There is no evidence of acute fracture or  sacroiliac joint diastasis. There are degenerative changes at the sacroiliac joints, both hips and the symphysis pubis. Radiodensities overlapping the pelvis are likely related to retained contrast within colonic diverticula.  IMPRESSION: No evidence of acute pelvic fracture or dislocation. Degenerative changes as described.   Electronically Signed   By: Camie Patience M.D.   On: 08/25/2014 18:42   Ct Head Wo Contrast  08/25/2014   CLINICAL DATA:  Fall.  Head injury.  EXAM: CT HEAD WITHOUT CONTRAST  TECHNIQUE: Contiguous axial images were obtained from the base of the skull through the vertex without intravenous contrast.  COMPARISON:  07/09/2013.  FINDINGS: Low attenuation throughout the subcortical and periventricular white matter noted compatible with chronic small vessel ischemic change. There is prominence of the sulci and ventricles compatible with brain atrophy. There is no evidence for acute intracranial hemorrhage, infarct or mass. The paranasal sinuses appear clear. The mastoid air cells are clear. The skull is intact.  IMPRESSION: 1. No acute intracranial abnormalities. 2. Small vessel ischemic disease and brain atrophy.   Electronically Signed   By: Kerby Moors M.D.   On: 08/25/2014 19:06   Ct Lumbar Spine Wo Contrast  08/25/2014   CLINICAL DATA:  Initial encounter for 3 falls during the last 3 days. Initial fall was a slip while trying to hang close at home. Patient landed on her buttocks. The fall was on the same level.  EXAM: CT LUMBAR SPINE WITHOUT CONTRAST  TECHNIQUE: Multidetector CT imaging of the lumbar spine was performed without intravenous contrast administration. Multiplanar CT image reconstructions were also generated.  COMPARISON:  Lumbar spine radiographs from the same day.  FINDINGS: CT confirms an acute superior endplate fracture of L2. This is a burst fracture with posterior displaced fragments 4 mm from the expected wall of the posterior vertebral body is encroaches into the  spinal canal without significant stenosis. The fractures extend into the pedicle bilaterally. Remaining vertebral bodies are intact. No additional fractures are present. Alignment is anatomic. Facet degenerative changes are present.  Limited imaging of the abdomen demonstrates significant atrophy of the left kidney. Atherosclerotic calcifications are present in the aorta without aneurysm. Degenerative changes are noted at the SI joints bilaterally.  IMPRESSION: 1. Burst fracture of the superior endplate of L2. Bone fragments are retropulsed 4 mm into the canal without significant stenosis. 2. Fractures extend through the pedicles bilaterally. 3. Degenerative changes through the lumbar spine without additional fracture. 4. Severe atrophy of the left kidney. 5. Atherosclerosis   Electronically Signed   By: Lawrence Santiago M.D.   On: 08/25/2014 21:14       Microbiology: No results found for this or any previous visit (from the past 240 hour(s)).   Labs: Results for orders placed during the hospital encounter of 08/25/14 (from the past 48 hour(s))  CBC WITH DIFFERENTIAL  Status: None   Collection Time    08/25/14  5:58 PM      Result Value Ref Range   WBC 9.8  4.0 - 10.5 K/uL   RBC 3.96  3.87 - 5.11 MIL/uL   Hemoglobin 12.1  12.0 - 15.0 g/dL   HCT 36.2  36.0 - 46.0 %   MCV 91.4  78.0 - 100.0 fL   MCH 30.6  26.0 - 34.0 pg   MCHC 33.4  30.0 - 36.0 g/dL   RDW 13.7  11.5 - 15.5 %   Platelets 195  150 - 400 K/uL   Neutrophils Relative % 57  43 - 77 %   Neutro Abs 5.5  1.7 - 7.7 K/uL   Lymphocytes Relative 34  12 - 46 %   Lymphs Abs 3.3  0.7 - 4.0 K/uL   Monocytes Relative 9  3 - 12 %   Monocytes Absolute 0.9  0.1 - 1.0 K/uL   Eosinophils Relative 0  0 - 5 %   Eosinophils Absolute 0.0  0.0 - 0.7 K/uL   Basophils Relative 0  0 - 1 %   Basophils Absolute 0.0  0.0 - 0.1 K/uL  BASIC METABOLIC PANEL     Status: Abnormal   Collection Time    08/25/14  5:58 PM      Result Value Ref Range    Sodium 138  137 - 147 mEq/L   Potassium 4.0  3.7 - 5.3 mEq/L   Chloride 102  96 - 112 mEq/L   CO2 24  19 - 32 mEq/L   Glucose, Bld 103 (*) 70 - 99 mg/dL   BUN 31 (*) 6 - 23 mg/dL   Creatinine, Ser 1.24 (*) 0.50 - 1.10 mg/dL   Calcium 9.3  8.4 - 10.5 mg/dL   GFR calc non Af Amer 36 (*) >90 mL/min   GFR calc Af Amer 42 (*) >90 mL/min   Comment: (NOTE)     The eGFR has been calculated using the CKD EPI equation.     This calculation has not been validated in all clinical situations.     eGFR's persistently <90 mL/min signify possible Chronic Kidney     Disease.   Anion gap 12  5 - 15  LACTIC ACID, PLASMA     Status: None   Collection Time    08/25/14  5:58 PM      Result Value Ref Range   Lactic Acid, Venous 0.9  0.5 - 2.2 mmol/L  TROPONIN I     Status: None   Collection Time    08/25/14  5:58 PM      Result Value Ref Range   Troponin I <0.30  <0.30 ng/mL   Comment:            Due to the release kinetics of cTnI,     a negative result within the first hours     of the onset of symptoms does not rule out     myocardial infarction with certainty.     If myocardial infarction is still suspected,     repeat the test at appropriate intervals.  URINALYSIS, ROUTINE W REFLEX MICROSCOPIC     Status: None   Collection Time    08/25/14  7:14 PM      Result Value Ref Range   Color, Urine YELLOW  YELLOW   APPearance CLEAR  CLEAR   Specific Gravity, Urine 1.019  1.005 - 1.030   pH 6.0  5.0 -  8.0   Glucose, UA NEGATIVE  NEGATIVE mg/dL   Hgb urine dipstick NEGATIVE  NEGATIVE   Bilirubin Urine NEGATIVE  NEGATIVE   Ketones, ur NEGATIVE  NEGATIVE mg/dL   Protein, ur NEGATIVE  NEGATIVE mg/dL   Urobilinogen, UA 0.2  0.0 - 1.0 mg/dL   Nitrite NEGATIVE  NEGATIVE   Leukocytes, UA NEGATIVE  NEGATIVE   Comment: MICROSCOPIC NOT DONE ON URINES WITH NEGATIVE PROTEIN, BLOOD, LEUKOCYTES, NITRITE, OR GLUCOSE <1000 mg/dL.  CBC     Status: Abnormal   Collection Time    08/26/14  6:15 AM      Result  Value Ref Range   WBC 8.2  4.0 - 10.5 K/uL   RBC 3.72 (*) 3.87 - 5.11 MIL/uL   Hemoglobin 11.4 (*) 12.0 - 15.0 g/dL   HCT 33.9 (*) 36.0 - 46.0 %   MCV 91.1  78.0 - 100.0 fL   MCH 30.6  26.0 - 34.0 pg   MCHC 33.6  30.0 - 36.0 g/dL   RDW 13.7  11.5 - 15.5 %   Platelets 183  150 - 400 K/uL  COMPREHENSIVE METABOLIC PANEL     Status: Abnormal   Collection Time    08/26/14  6:15 AM      Result Value Ref Range   Sodium 140  137 - 147 mEq/L   Potassium 3.5 (*) 3.7 - 5.3 mEq/L   Chloride 106  96 - 112 mEq/L   CO2 23  19 - 32 mEq/L   Glucose, Bld 93  70 - 99 mg/dL   BUN 28 (*) 6 - 23 mg/dL   Creatinine, Ser 1.16 (*) 0.50 - 1.10 mg/dL   Calcium 8.7  8.4 - 10.5 mg/dL   Total Protein 5.6 (*) 6.0 - 8.3 g/dL   Albumin 3.3 (*) 3.5 - 5.2 g/dL   AST 22  0 - 37 U/L   ALT 16  0 - 35 U/L   Alkaline Phosphatase 54  39 - 117 U/L   Total Bilirubin 0.6  0.3 - 1.2 mg/dL   GFR calc non Af Amer 39 (*) >90 mL/min   GFR calc Af Amer 46 (*) >90 mL/min   Comment: (NOTE)     The eGFR has been calculated using the CKD EPI equation.     This calculation has not been validated in all clinical situations.     eGFR's persistently <90 mL/min signify possible Chronic Kidney     Disease.   Anion gap 11  5 - 15     HPI : Quaniya Damas is a 80 y.o. female with Past medical history of COPD, dementia, hypertension.  The patient is presenting with recurrent fall. She was brought in by the family. As per the daughter who is the primary caregiver for the patient the patient had a fall 3 days ago while she was trying to hang her clothes in the closet. She did complain of back pain after the fall but she was still able to ambulate. There was no head injury at that point. After that the patient had 3 more falls, most of them were while ambulating and due to loss of balance. As per the daughter the patient had history of recurrent fall with one fall nearly every week during the summer and was admitted in [place] while she  was there, she also has undergone physical therapy for the same and every fall she has been diagnosed with dehydration.  At present daughter mentions no recent change in her  medications. Patient denies any focal deficit diarrhea loss of control of bowel or bladder.  The patient is coming from home. And at her baseline independent for most of her ADL.  HOSPITAL COURSE:   Burst fracture of the superior endplate of L2. Bone fragments are  retropulsed 4 mm into the canal without significant stenosis.   Fractures extend through the pedicles bilaterally. This was seen on CT scan of the lumbar spine Seen by neurosurgery Fitted with a TLSO brace Patient to followup with Dr. Julaine Fusi in one week for repeat x-rays Low-dose Flexeril and Ultracet for pain control  Dehydration Creatinine stable Found to be orthostatic on admission HCTZ held Patient hydrated with fluids before discharge  Dementia.  Patient does not have any behavioral issues at present.  Continue home medications.      Discharge Exam:  Blood pressure 169/75, pulse 71, temperature 98.2 F (36.8 C), temperature source Oral, resp. rate 18, height 5' 6"  (1.676 m), weight 47.373 kg (104 lb 7 oz), SpO2 93.00%.  General: Alert, Awake and Oriented to Time, Place and Person. Appear in mild distress  Eyes: PERRL  ENT: Oral Mucosa clear dry.  Neck: no JVD  Cardiovascular: S1 and S2 Present, no Murmur, Peripheral Pulses Present  Respiratory: Bilateral Air entry equal and Decreased, Clear to Auscultation, noCrackles, no wheezes  Abdomen: Bowel Sound present, Soft and Non tender  Skin: no Rash  Extremities: no Pedal edema, no calf tenderness  Neurologic: Grossly no focal neuro deficit.        Discharge Instructions   Diet - low sodium heart healthy    Complete by:  As directed      Increase activity slowly    Complete by:  As directed            Follow-up Information   Follow up with Thressa Sheller, MD. Schedule an  appointment as soon as possible for a visit in 1 week.   Specialty:  Internal Medicine   Contact information:   [place], [place] [address] [phone]       Follow up with Peggyann Shoals, MD. Schedule an appointment as soon as possible for a visit in 1 week.   Specialty:  Neurosurgery   Contact information:   [address] [place] [address] [phone]       Signed: Reyne Dumas 08/26/2014, 11:10 AM

## 2014-08-26 NOTE — Progress Notes (Signed)
Talked to patient about DCP/ patient is living with her daughter and is agreeable to go to SNF at discharge; Patient gave CM permission to talk to her daughter Misty StanleyLisa (cell # [phone]); TCT Misty StanleyLisa, she is in agreement for SNF placement and requested [facility] if possible; Dysheka Soc Worker made aware of dispositionAlexis Goodell; B [place] Souter RN,BSN,MHA [phone]

## 2014-08-26 NOTE — Evaluation (Signed)
Physical Therapy Evaluation Patient Details Name: Andrea [name] MRN: [id number] DOB: Jul 06, 1919 Today's Date: 08/26/2014   History of Present Illness  Pt adm with dehydration and L2 fx after fall. PMH - dementia, COPD  Clinical Impression  Pt admitted with above. Pt currently with functional limitations due to the deficits listed below (see PT Problem List).  Pt will benefit from skilled PT to increase their independence and safety with mobility to allow discharge to the venue listed below. Currently pt very limited with mobility and feel she needs ST-SNF unless family is able to provide 24 hour assistance since pt will require assist for all mobility and ADL's.     Follow Up Recommendations SNF    Equipment Recommendations  3in1 (PT)    Recommendations for Other Services       Precautions / Restrictions Precautions Precautions: Fall Required Braces or Orthoses: Spinal Brace Spinal Brace: Thoracolumbosacral orthotic;Applied in sitting position      Mobility  Bed Mobility Overal bed mobility: Needs Assistance Bed Mobility: Sit to Sidelying         Sit to sidelying: Mod assist General bed mobility comments: Assist to lower shoulders and bring legs up.  Transfers Overall transfer level: Needs assistance Equipment used: Rolling walker (2 wheeled) Transfers: Sit to/from Stand Sit to Stand: Mod assist         General transfer comment: Assist to bring hips up and pt bracing posterior calves on bed.  Ambulation/Gait Ambulation/Gait assistance: Min assist Ambulation Distance (Feet): 20 Feet Assistive device: Rolling walker (2 wheeled) Gait Pattern/deviations: Step-through pattern;Decreased step length - right;Decreased step length - left;Shuffle;Trunk flexed Gait velocity: very slow Gait velocity interpretation: Below normal speed for age/gender General Gait Details: Verbal cues to continue taking steps and to stand more erect and to look up. Upper and lower  extremities tremulous.  Stairs            Wheelchair Mobility    Modified Rankin (Stroke Patients Only)       Balance Overall balance assessment: Needs assistance Sitting-balance support: Feet supported;No upper extremity supported Sitting balance-Leahy Scale: Fair     Standing balance support: Bilateral upper extremity supported Standing balance-Leahy Scale: Poor Standing balance comment: Posterior lean with walker.                             Pertinent Vitals/Pain Pain Assessment: Faces Faces Pain Scale: Hurts even more Pain Location: back Pain Descriptors / Indicators: Grimacing;Crying Pain Intervention(s): Limited activity within patient's tolerance;Repositioned    Home Living Family/patient expects to be discharged to:: Private residence Living Arrangements: Children Available Help at Discharge: Family Type of Home: House Home Access: Stairs to enter Entrance Stairs-Rails: None Entrance Stairs-Number of Steps: 2 Home Layout: Multi-level;Able to live on main level with bedroom/bathroom Home Equipment: Dan Humphreys - 2 wheels;Wheelchair - manual      Prior Function Level of Independence: Needs assistance   Gait / Transfers Assistance Needed: Amb with rolling walker household distances and has had frequent falls.           Hand Dominance        Extremity/Trunk Assessment   Upper Extremity Assessment: Defer to OT evaluation           Lower Extremity Assessment: Generalized weakness         Communication      Cognition Arousal/Alertness: Awake/alert Behavior During Therapy: Anxious (emotionally labile) Overall Cognitive Status: No family/caregiver present to determine baseline cognitive  functioning                      General Comments      Exercises        Assessment/Plan    PT Assessment Patient needs continued PT services  PT Diagnosis Difficulty walking;Generalized weakness;Abnormality of gait;Acute pain    PT Problem List Decreased strength;Decreased activity tolerance;Decreased balance;Decreased mobility;Decreased knowledge of use of DME;Decreased knowledge of precautions;Pain  PT Treatment Interventions Gait training;Functional mobility training;Therapeutic activities;Therapeutic exercise;Balance training;DME instruction;Patient/family education   PT Goals (Current goals can be found in the Care Plan section) Acute Rehab PT Goals Patient Stated Goal: Pt reports she doesn't want to be a burden to her daughter. PT Goal Formulation: With patient Time For Goal Achievement: 09/02/14 Potential to Achieve Goals: Fair    Frequency Min 3X/week   Barriers to discharge        Co-evaluation               End of Session Equipment Utilized During Treatment: Gait belt;Back brace Activity Tolerance: Patient limited by fatigue;Patient limited by pain Patient left: in bed;with call bell/phone within reach;with bed alarm set Nurse Communication: Mobility status         Time: 1205-1220 PT Time Calculation (min): 15 min   Charges:   PT Evaluation $Initial PT Evaluation Tier I: 1 Procedure PT Treatments $Gait Training: 8-22 mins   PT G Codes:          Clemencia Helzer 08/26/2014, 12:41 PM  [employer] CorporationCary Tyleek Smick PT [phone]

## 2014-08-26 NOTE — Consult Note (Signed)
Reason for Consult:L2 fracture after a fall Referring Physician:Dr. [name] is an 80 y.o. female.  HPI: Andrea [name] is a 80 y.o. female with Past medical history of COPD, dementia, hypertension.  The patient is presenting with recurrent fall. She was brought in by the family. As per the daughter who is the primary caregiver for the patient the patient had a fall 3 days ago while she was trying to hang her clothes in the closet. She did complain of back pain after the fall but she was still able to ambulate. There was no head injury at that point. After that the patient had 3 more falls, most of them were while ambulating and due to loss of balance. As per the daughter the patient had history of recurrent fall with one fall nearly every week during the summer and was admitted in [place] while she was there, she also has undergone physical therapy for the same and every fall she has been diagnosed with dehydration.  At present daughter mentions no recent change in her medications. Patient denies any focal deficit diarrhea loss of control of bowel or bladder.  The patient is coming from home. And at her baseline independent for most of her ADL.      Past Medical History  Diagnosis Date  . COPD (chronic obstructive pulmonary disease)   . Alzheimer's dementia   . Depression   . Chronic kidney disease   . Hypertension     History reviewed. No pertinent past surgical history.  Family History  Problem Relation Age of Onset  . Suicidality Other     Social History:  reports that she has never smoked. She does not have any smokeless tobacco history on file. She reports that she does not drink alcohol or use illicit drugs.  Allergies:  Allergies  Allergen Reactions  . Aspirin     Stomach upset    Medications: I have reviewed the patient's current medications.  Results for orders placed during the hospital encounter of 08/25/14 (from the past 48 hour(s))  CBC WITH  DIFFERENTIAL     Status: None   Collection Time    08/25/14  5:58 PM      Result Value Ref Range   WBC 9.8  4.0 - 10.5 K/uL   RBC 3.96  3.87 - 5.11 MIL/uL   Hemoglobin 12.1  12.0 - 15.0 g/dL   HCT 36.2  36.0 - 46.0 %   MCV 91.4  78.0 - 100.0 fL   MCH 30.6  26.0 - 34.0 pg   MCHC 33.4  30.0 - 36.0 g/dL   RDW 13.7  11.5 - 15.5 %   Platelets 195  150 - 400 K/uL   Neutrophils Relative % 57  43 - 77 %   Neutro Abs 5.5  1.7 - 7.7 K/uL   Lymphocytes Relative 34  12 - 46 %   Lymphs Abs 3.3  0.7 - 4.0 K/uL   Monocytes Relative 9  3 - 12 %   Monocytes Absolute 0.9  0.1 - 1.0 K/uL   Eosinophils Relative 0  0 - 5 %   Eosinophils Absolute 0.0  0.0 - 0.7 K/uL   Basophils Relative 0  0 - 1 %   Basophils Absolute 0.0  0.0 - 0.1 K/uL  BASIC METABOLIC PANEL     Status: Abnormal   Collection Time    08/25/14  5:58 PM      Result Value Ref Range   Sodium 138  137 - 147 mEq/L   Potassium 4.0  3.7 - 5.3 mEq/L   Chloride 102  96 - 112 mEq/L   CO2 24  19 - 32 mEq/L   Glucose, Bld 103 (*) 70 - 99 mg/dL   BUN 31 (*) 6 - 23 mg/dL   Creatinine, Ser 1.24 (*) 0.50 - 1.10 mg/dL   Calcium 9.3  8.4 - 10.5 mg/dL   GFR calc non Af Amer 36 (*) >90 mL/min   GFR calc Af Amer 42 (*) >90 mL/min   Comment: (NOTE)     The eGFR has been calculated using the CKD EPI equation.     This calculation has not been validated in all clinical situations.     eGFR's persistently <90 mL/min signify possible Chronic Kidney     Disease.   Anion gap 12  5 - 15  LACTIC ACID, PLASMA     Status: None   Collection Time    08/25/14  5:58 PM      Result Value Ref Range   Lactic Acid, Venous 0.9  0.5 - 2.2 mmol/L  TROPONIN I     Status: None   Collection Time    08/25/14  5:58 PM      Result Value Ref Range   Troponin I <0.30  <0.30 ng/mL   Comment:            Due to the release kinetics of cTnI,     a negative result within the first hours     of the onset of symptoms does not rule out     myocardial infarction with  certainty.     If myocardial infarction is still suspected,     repeat the test at appropriate intervals.  URINALYSIS, ROUTINE W REFLEX MICROSCOPIC     Status: None   Collection Time    08/25/14  7:14 PM      Result Value Ref Range   Color, Urine YELLOW  YELLOW   APPearance CLEAR  CLEAR   Specific Gravity, Urine 1.019  1.005 - 1.030   pH 6.0  5.0 - 8.0   Glucose, UA NEGATIVE  NEGATIVE mg/dL   Hgb urine dipstick NEGATIVE  NEGATIVE   Bilirubin Urine NEGATIVE  NEGATIVE   Ketones, ur NEGATIVE  NEGATIVE mg/dL   Protein, ur NEGATIVE  NEGATIVE mg/dL   Urobilinogen, UA 0.2  0.0 - 1.0 mg/dL   Nitrite NEGATIVE  NEGATIVE   Leukocytes, UA NEGATIVE  NEGATIVE   Comment: MICROSCOPIC NOT DONE ON URINES WITH NEGATIVE PROTEIN, BLOOD, LEUKOCYTES, NITRITE, OR GLUCOSE <1000 mg/dL.  CBC     Status: Abnormal   Collection Time    08/26/14  6:15 AM      Result Value Ref Range   WBC 8.2  4.0 - 10.5 K/uL   RBC 3.72 (*) 3.87 - 5.11 MIL/uL   Hemoglobin 11.4 (*) 12.0 - 15.0 g/dL   HCT 33.9 (*) 36.0 - 46.0 %   MCV 91.1  78.0 - 100.0 fL   MCH 30.6  26.0 - 34.0 pg   MCHC 33.6  30.0 - 36.0 g/dL   RDW 13.7  11.5 - 15.5 %   Platelets 183  150 - 400 K/uL  COMPREHENSIVE METABOLIC PANEL     Status: Abnormal   Collection Time    08/26/14  6:15 AM      Result Value Ref Range   Sodium 140  137 - 147 mEq/L   Potassium 3.5 (*) 3.7 - 5.3 mEq/L  Chloride 106  96 - 112 mEq/L   CO2 23  19 - 32 mEq/L   Glucose, Bld 93  70 - 99 mg/dL   BUN 28 (*) 6 - 23 mg/dL   Creatinine, Ser 1.16 (*) 0.50 - 1.10 mg/dL   Calcium 8.7  8.4 - 10.5 mg/dL   Total Protein 5.6 (*) 6.0 - 8.3 g/dL   Albumin 3.3 (*) 3.5 - 5.2 g/dL   AST 22  0 - 37 U/L   ALT 16  0 - 35 U/L   Alkaline Phosphatase 54  39 - 117 U/L   Total Bilirubin 0.6  0.3 - 1.2 mg/dL   GFR calc non Af Amer 39 (*) >90 mL/min   GFR calc Af Amer 46 (*) >90 mL/min   Comment: (NOTE)     The eGFR has been calculated using the CKD EPI equation.     This calculation has not  been validated in all clinical situations.     eGFR's persistently <90 mL/min signify possible Chronic Kidney     Disease.   Anion gap 11  5 - 15    Dg Chest 2 View  08/25/2014   CLINICAL DATA:  Left chest pain following a fall today.  EXAM: CHEST  2 VIEW  COMPARISON:  06/17/2013.  FINDINGS: The heart remains normal in size. The lungs remain hyperexpanded and clear. Old, healed left rib fractures are again demonstrated. No acute fracture or pneumothorax seen. Diffuse osteopenia is noted as well as mid to lower thoracic spine degenerative changes. Right shoulder degenerative changes.  IMPRESSION: No acute abnormality. Stable changes of COPD. If there is a clinical concern for left rib fracture, left rib radiographs would be recommended.   Electronically Signed   By: Enrique Sack M.D.   On: 08/25/2014 18:42   Dg Lumbar Spine Complete  08/25/2014   CLINICAL DATA:  Low back pain following a fall today.  EXAM: LUMBAR SPINE - COMPLETE 4+ VIEW  COMPARISON:  Abdomen radiographs dated 12/05/2004.  FINDINGS: Five non-rib-bearing lumbar vertebrae. Interval approximately 50% compression deformity of the L2 vertebral body with mild bony retropulsion and possible acute fracture lines and fragments anteriorly. No additional fractures. No pars defects. Facet degenerative changes in the mid and lower lumbar spine with associated grade 1 anterolisthesis at the L3-4 level. Atheromatous arterial calcifications.  IMPRESSION: 1. Probably acute L2 vertebral body 50% compression fracture. This could be confirmed with a noncontrast a lumbar spine CT if clinically indicated. 2. Diffuse osteopenia. 3. Degenerative changes.   Electronically Signed   By: Enrique Sack M.D.   On: 08/25/2014 18:45   Dg Pelvis 1-2 Views  08/25/2014   CLINICAL DATA:  Left pelvic pain after falling.  Initial encounter.  EXAM: PELVIS - 1-2 VIEW  COMPARISON:  Abdominal radiographs 12/05/2004.  FINDINGS: The bones are diffusely demineralized. There is no  evidence of acute fracture or sacroiliac joint diastasis. There are degenerative changes at the sacroiliac joints, both hips and the symphysis pubis. Radiodensities overlapping the pelvis are likely related to retained contrast within colonic diverticula.  IMPRESSION: No evidence of acute pelvic fracture or dislocation. Degenerative changes as described.   Electronically Signed   By: Camie Patience M.D.   On: 08/25/2014 18:42   Ct Head Wo Contrast  08/25/2014   CLINICAL DATA:  Fall.  Head injury.  EXAM: CT HEAD WITHOUT CONTRAST  TECHNIQUE: Contiguous axial images were obtained from the base of the skull through the vertex without intravenous contrast.  COMPARISON:  07/09/2013.  FINDINGS: Low attenuation throughout the subcortical and periventricular white matter noted compatible with chronic small vessel ischemic change. There is prominence of the sulci and ventricles compatible with brain atrophy. There is no evidence for acute intracranial hemorrhage, infarct or mass. The paranasal sinuses appear clear. The mastoid air cells are clear. The skull is intact.  IMPRESSION: 1. No acute intracranial abnormalities. 2. Small vessel ischemic disease and brain atrophy.   Electronically Signed   By: Kerby Moors M.D.   On: 08/25/2014 19:06   Ct Lumbar Spine Wo Contrast  08/25/2014   CLINICAL DATA:  Initial encounter for 3 falls during the last 3 days. Initial fall was a slip while trying to hang close at home. Patient landed on her buttocks. The fall was on the same level.  EXAM: CT LUMBAR SPINE WITHOUT CONTRAST  TECHNIQUE: Multidetector CT imaging of the lumbar spine was performed without intravenous contrast administration. Multiplanar CT image reconstructions were also generated.  COMPARISON:  Lumbar spine radiographs from the same day.  FINDINGS: CT confirms an acute superior endplate fracture of L2. This is a burst fracture with posterior displaced fragments 4 mm from the expected wall of the posterior vertebral  body is encroaches into the spinal canal without significant stenosis. The fractures extend into the pedicle bilaterally. Remaining vertebral bodies are intact. No additional fractures are present. Alignment is anatomic. Facet degenerative changes are present.  Limited imaging of the abdomen demonstrates significant atrophy of the left kidney. Atherosclerotic calcifications are present in the aorta without aneurysm. Degenerative changes are noted at the SI joints bilaterally.  IMPRESSION: 1. Burst fracture of the superior endplate of L2. Bone fragments are retropulsed 4 mm into the canal without significant stenosis. 2. Fractures extend through the pedicles bilaterally. 3. Degenerative changes through the lumbar spine without additional fracture. 4. Severe atrophy of the left kidney. 5. Atherosclerosis   Electronically Signed   By: Lawrence Santiago M.D.   On: 08/25/2014 21:14    Review of Systems - Negative except as above    Blood pressure 157/68, pulse 74, temperature 97.7 F (36.5 C), temperature source Oral, resp. rate 18, height 5' 6"  (1.676 m), weight 47.373 kg (104 lb 7 oz), SpO2 94.00%. Physical Exam  Constitutional: She is oriented to person, place, and time. She appears well-developed and well-nourished.  HENT:  Head: Normocephalic and atraumatic.  Right Ear: External ear normal.  Left Ear: External ear normal.  Nose: Nose normal.  Mouth/Throat: Oropharynx is clear and moist.  Eyes: Conjunctivae and EOM are normal. Pupils are equal, round, and reactive to light.  Neck: Normal range of motion. Neck supple.  GI: Soft.  Musculoskeletal: Normal range of motion.  No step offs, bruising or deformities over TL spine  Neurological: She is alert and oriented to person, place, and time. She has normal reflexes.  Skin: Skin is warm and dry.    Assessment/Plan: Patient has L 2 fracture after a fall.  She is elderly and has mild dementia.  She has minimal retropulsion of bone into her spinal  canal of 4 mm without significant stenosis.  She has back pain and a normal neurologic exam of the lower extremities.  She should be fitted in a TLSO brace and wear this when up.  She should ambulate with PT and will likely require placement depending on her ability to care for herself.  She can follow up with me in my office in one month with repeat AP and Lateral lumbar radiographs.  Peggyann Shoals, MD 08/26/2014, 8:51 AM

## 2014-08-27 DIAGNOSIS — S32029A Unspecified fracture of second lumbar vertebra, initial encounter for closed fracture: Secondary | ICD-10-CM | POA: Diagnosis not present

## 2014-08-27 MED ORDER — LORAZEPAM 2 MG/ML IJ SOLN
1.0000 mg | Freq: Four times a day (QID) | INTRAMUSCULAR | Status: DC | PRN
  Administered 2014-08-27: 1 mg via INTRAVENOUS
  Filled 2014-08-27: qty 1

## 2014-08-27 MED ORDER — METOPROLOL TARTRATE 12.5 MG HALF TABLET
12.5000 mg | ORAL_TABLET | Freq: Two times a day (BID) | ORAL | Status: DC
  Administered 2014-08-27 – 2014-08-28 (×3): 12.5 mg via ORAL
  Filled 2014-08-27 (×3): qty 1

## 2014-08-27 MED ORDER — MORPHINE SULFATE 2 MG/ML IJ SOLN: 1.0000 mg | INTRAMUSCULAR | Status: DC | PRN

## 2014-08-27 NOTE — Progress Notes (Signed)
Subjective: Patient reports "I want to go home.  Why do you treat me like this?   I am in prison."  Objective: Vital signs in last 24 hours: Temp:  [97.7 F (36.5 C)-99 F (37.2 C)] 98.2 F (36.8 C) (10/07 0600) Pulse Rate:  [68-76] 71 (10/07 0600) Resp:  [18-20] 20 (10/07 0600) BP: (144-180)/(70-81) 176/81 mmHg (10/07 0600) SpO2:  [93 %-96 %] 95 % (10/07 0248) Weight:  [48.217 kg (106 lb 4.8 oz)] 48.217 kg (106 lb 4.8 oz) (10/07 0500)  Intake/Output from previous day: 10/06 0701 - 10/07 0700 In: 440 [P.O.:440] Out: -  Intake/Output this shift:    Physical Exam: Awake, alert, confused, mildly agitated and tearful.  Asking for her daughter.  Not complaining of back pain or even aware of spine fracture at this point.  Lab Results:  Recent Labs  08/25/14 1758 08/26/14 0615  WBC 9.8 8.2  HGB 12.1 11.4*  HCT 36.2 33.9*  PLT 195 183   BMET  Recent Labs  08/25/14 1758 08/26/14 0615  NA 138 140  K 4.0 3.5*  CL 102 106  CO2 24 23  GLUCOSE 103* 93  BUN 31* 28*  CREATININE 1.24* 1.16*  CALCIUM 9.3 8.7    Studies/Results: Dg Chest 2 View  08/25/2014   CLINICAL DATA:  Left chest pain following a fall today.  EXAM: CHEST  2 VIEW  COMPARISON:  06/17/2013.  FINDINGS: The heart remains normal in size. The lungs remain hyperexpanded and clear. Old, healed left rib fractures are again demonstrated. No acute fracture or pneumothorax seen. Diffuse osteopenia is noted as well as mid to lower thoracic spine degenerative changes. Right shoulder degenerative changes.  IMPRESSION: No acute abnormality. Stable changes of COPD. If there is a clinical concern for left rib fracture, left rib radiographs would be recommended.   Electronically Signed   By: Gordan Payment M.D.   On: 08/25/2014 18:42   Dg Lumbar Spine Complete  08/25/2014   CLINICAL DATA:  Low back pain following a fall today.  EXAM: LUMBAR SPINE - COMPLETE 4+ VIEW  COMPARISON:  Abdomen radiographs dated 12/05/2004.  FINDINGS:  Five non-rib-bearing lumbar vertebrae. Interval approximately 50% compression deformity of the L2 vertebral body with mild bony retropulsion and possible acute fracture lines and fragments anteriorly. No additional fractures. No pars defects. Facet degenerative changes in the mid and lower lumbar spine with associated grade 1 anterolisthesis at the L3-4 level. Atheromatous arterial calcifications.  IMPRESSION: 1. Probably acute L2 vertebral body 50% compression fracture. This could be confirmed with a noncontrast a lumbar spine CT if clinically indicated. 2. Diffuse osteopenia. 3. Degenerative changes.   Electronically Signed   By: Gordan Payment M.D.   On: 08/25/2014 18:45   Dg Pelvis 1-2 Views  08/25/2014   CLINICAL DATA:  Left pelvic pain after falling.  Initial encounter.  EXAM: PELVIS - 1-2 VIEW  COMPARISON:  Abdominal radiographs 12/05/2004.  FINDINGS: The bones are diffusely demineralized. There is no evidence of acute fracture or sacroiliac joint diastasis. There are degenerative changes at the sacroiliac joints, both hips and the symphysis pubis. Radiodensities overlapping the pelvis are likely related to retained contrast within colonic diverticula.  IMPRESSION: No evidence of acute pelvic fracture or dislocation. Degenerative changes as described.   Electronically Signed   By: Roxy Horseman M.D.   On: 08/25/2014 18:42   Ct Head Wo Contrast  08/25/2014   CLINICAL DATA:  Fall.  Head injury.  EXAM: CT HEAD WITHOUT CONTRAST  TECHNIQUE: Contiguous axial images were obtained from the base of the skull through the vertex without intravenous contrast.  COMPARISON:  07/09/2013.  FINDINGS: Low attenuation throughout the subcortical and periventricular white matter noted compatible with chronic small vessel ischemic change. There is prominence of the sulci and ventricles compatible with brain atrophy. There is no evidence for acute intracranial hemorrhage, infarct or mass. The paranasal sinuses appear clear. The  mastoid air cells are clear. The skull is intact.  IMPRESSION: 1. No acute intracranial abnormalities. 2. Small vessel ischemic disease and brain atrophy.   Electronically Signed   By: Signa Kellaylor  Stroud M.D.   On: 08/25/2014 19:06   Ct Lumbar Spine Wo Contrast  08/25/2014   CLINICAL DATA:  Initial encounter for 3 falls during the last 3 days. Initial fall was a slip while trying to hang close at home. Patient landed on her buttocks. The fall was on the same level.  EXAM: CT LUMBAR SPINE WITHOUT CONTRAST  TECHNIQUE: Multidetector CT imaging of the lumbar spine was performed without intravenous contrast administration. Multiplanar CT image reconstructions were also generated.  COMPARISON:  Lumbar spine radiographs from the same day.  FINDINGS: CT confirms an acute superior endplate fracture of L2. This is a burst fracture with posterior displaced fragments 4 mm from the expected wall of the posterior vertebral body is encroaches into the spinal canal without significant stenosis. The fractures extend into the pedicle bilaterally. Remaining vertebral bodies are intact. No additional fractures are present. Alignment is anatomic. Facet degenerative changes are present.  Limited imaging of the abdomen demonstrates significant atrophy of the left kidney. Atherosclerotic calcifications are present in the aorta without aneurysm. Degenerative changes are noted at the SI joints bilaterally.  IMPRESSION: 1. Burst fracture of the superior endplate of L2. Bone fragments are retropulsed 4 mm into the canal without significant stenosis. 2. Fractures extend through the pedicles bilaterally. 3. Degenerative changes through the lumbar spine without additional fracture. 4. Severe atrophy of the left kidney. 5. Atherosclerosis   Electronically Signed   By: Gennette Pachris  Mattern M.D.   On: 08/25/2014 21:14    Assessment/Plan: Mobilize in brace.  Asking for her daughter.  Dementia/confusion currently significant issues.    LOS: 2 days     Dorian HeckleSTERN,Emery Dupuy D, MD 08/27/2014, 8:19 AM

## 2014-08-27 NOTE — Clinical Social Work Psychosocial (Signed)
Clinical Social Work Department BRIEF PSYCHOSOCIAL ASSESSMENT 08/27/2014  Patient:  Andrea [name], Andrea [name]     Account Number:  [id number]     Admit date:  08/25/2014  Clinical Social Worker:  Marciano Sequin  Date/Time:  08/26/2014 12:58 PM  Referred by:  RN  Date Referred:  08/26/2014 Referred for  SNF Placement   Other Referral:   Interview type:  Patient Other interview type:    PSYCHOSOCIAL DATA Living Status:  FACILITY Admitted from facility:   Level of care:   Primary support name:  Andrea [name] Read Primary support relationship to patient:  CHILD, ADULT Degree of support available:   Strong    CURRENT CONCERNS  Other Concerns:    SOCIAL WORK ASSESSMENT / PLAN CSW met pt and pt's daughter Andrea [name] by the bedside. CSW introduce self and purpose of visit. Pt presented with a normal affect and calm mood. CSW and pt discussed the clinical team recommendations for rehab. Pt reported wanting to go home. [name] explained to the pt that she needs to go to rehab first than come home. CSW explained the SNF process to the pt and pt's daughter. CSW provided the pt with contact information for further questions. CSW will continue to follow this pt and assist with discharge as needed.   Assessment/plan status:  Information/Referral to [employer] Other assessment/ plan:   Information/referral to community resources:   SNF list    PATIENT'S/FAMILY'S RESPONSE TO PLAN OF CARE: agreed   Greta Doom, MSW, [place]

## 2014-08-27 NOTE — Evaluation (Signed)
Occupational Therapy Evaluation Patient Details Name: Andrea LazierGeraldine [name] MRN: [id number] DOB: June 11, 1919 Today's Date: 08/27/2014    History of Present Illness Pt adm with dehydration and L2 fx after fall. PMH - dementia, COPD   Clinical Impression   PT admitted with L2 fx s/p  Fall. . Pt currently with functional limitiations due to the deficits listed below (see OT problem list). Pt demonstrates bed mobility at min (A) and requires (A) with brace. Question family ability to (A) upon d/c home. Pt could d/c home with daughter if daughter and demo with staff the brace. Pt ambulated > 100 ft with RW and completed toilet transfer  Pt will benefit from skilled OT to increase their independence and safety with adls and balance to allow discharge SNF. OT to follow acutely for back precautions with adls. Pt with baseline dementia adn will need constant reinforcement. PT demonstrates no evidence of learning previous information by staff. Pt upon arrival asking "is it time for me to go home now?" Pt could greatly benefit from home environment due to cognitive deficits.      Follow Up Recommendations  SNF (due to question assistance levle at home)    Equipment Recommendations  Other (comment) (defer)    Recommendations for Other Services       Precautions / Restrictions Precautions Precautions: Fall Precaution Comments: lack of awareness to back precautions      Mobility Bed Mobility Overal bed mobility: Needs Assistance Bed Mobility: Supine to Sit;Rolling Rolling: Supervision   Supine to sit: Min assist     General bed mobility comments: able to complete bed mobilty following commands  Transfers Overall transfer level: Needs assistance Equipment used: Rolling walker (2 wheeled) Transfers: Sit to/from Stand Sit to Stand: Min guard         General transfer comment: cues for hand placement and following commands    Balance Overall balance assessment: Needs assistance          Standing balance support: No upper extremity supported;During functional activity Standing balance-Leahy Scale: Poor                              ADL Overall ADL's : Needs assistance/impaired     Grooming: Wash/dry face;Oral care;Minimal assistance;Standing           Upper Body Dressing : Maximal assistance;Bed level (don brace)   Lower Body Dressing: Maximal assistance;Sit to/from stand   Toilet Transfer: Minimal assistance;Ambulation;Grab bars;Regular Toilet   Toileting- Clothing Manipulation and Hygiene: Maximal assistance;Sit to/from stand       Functional mobility during ADLs: Minimal assistance;Rolling walker General ADL Comments: Pt demonstrates ability to log roll with back precautions min guard. Pt with brace don supine with one side hooked and log rolled back the opposite way to secure. if daughter is able to place brace and complete bed mobilty pt could d/c home. IF daughter is able to give 24/7 (A) in addition to don brace. Pt ambulating with RW without deficit and completed toilet transfer. Pt needs constant cues for back precautions     Vision                     Perception     Praxis      Pertinent Vitals/Pain Pain Assessment: No/denies pain     Hand Dominance Right   Extremity/Trunk Assessment Upper Extremity Assessment Upper Extremity Assessment: Overall WFL for tasks assessed   Lower Extremity Assessment Lower  Extremity Assessment: Defer to PT evaluation   Cervical / Trunk Assessment Cervical / Trunk Assessment: Kyphotic   Communication Communication Communication: No difficulties   Cognition Arousal/Alertness: Awake/alert Behavior During Therapy: Anxious Overall Cognitive Status: History of cognitive impairments - at baseline                     General Comments       Exercises       Shoulder Instructions      Home Living Family/patient expects to be discharged to:: Private residence Living  Arrangements: Children Available Help at Discharge: Family Type of Home: House Home Access: Stairs to enter Secretary/administrator of Steps: 2 Entrance Stairs-Rails: None Home Layout: Multi-level;Able to live on main level with bedroom/bathroom     Bathroom Shower/Tub: Producer, television/film/video: Standard     Home Equipment: Environmental consultant - 2 wheels;Wheelchair - manual   Additional Comments: no family present so question if information is accurate      Prior Functioning/Environment Level of Independence: Needs assistance  Gait / Transfers Assistance Needed: Amb with rolling walker household distances and has had frequent falls.          OT Diagnosis: Generalized weakness;Cognitive deficits;Acute pain   OT Problem List: Decreased strength;Decreased activity tolerance;Impaired balance (sitting and/or standing);Decreased safety awareness;Decreased knowledge of use of DME or AE;Decreased knowledge of precautions;Pain   OT Treatment/Interventions: Self-care/ADL training;Therapeutic exercise;DME and/or AE instruction;Therapeutic activities;Cognitive remediation/compensation;Patient/family education;Balance training    OT Goals(Current goals can be found in the care plan section) Acute Rehab OT Goals Patient Stated Goal: to go home today OT Goal Formulation: Patient unable to participate in goal setting Time For Goal Achievement: 09/10/14 Potential to Achieve Goals: Good  OT Frequency: Min 2X/week   Barriers to D/C:            Co-evaluation              End of Session Equipment Utilized During Treatment: Gait belt;Rolling walker;Back brace Nurse Communication: Mobility status;Precautions  Activity Tolerance: Patient tolerated treatment well Patient left: in chair;with call bell/phone within reach;with chair alarm set   Time: 1358-1414 OT Time Calculation (min): 16 min Charges:  OT General Charges $OT Visit: 1 Procedure OT Evaluation $Initial OT Evaluation Tier  I: 1 Procedure OT Treatments $Self Care/Home Management : 8-22 mins G-Codes:    [name] 09/26/14, 2:50 PM Pager: [phone]

## 2014-08-27 NOTE — Clinical Social Work Placement (Addendum)
Clinical Social Work Department CLINICAL SOCIAL WORK PLACEMENT NOTE 08/27/2014  Patient:  Andrea [name],Andrea [name]  Account Number:  [id number] Admit date:  08/25/2014  Clinical Social Worker:  Ashok CordiaYSHEKA Alithia Zavaleta, LCSWA  Date/time:  08/27/2014 12:55 PM  Clinical Social Work is seeking post-discharge placement for this patient at the following level of care:   SKILLED NURSING   (*CSW will update this form in Epic as items are completed)   08/26/2014  Patient/family provided with [facility] Department of Clinical Social Work's list of facilities offering this level of care within the geographic area requested by the patient (or if unable, by the patient's family).  08/26/2014  Patient/family informed of their freedom to choose among providers that offer the needed level of care, that participate in Medicare, Medicaid or managed care program needed by the patient, have an available bed and are willing to accept the patient.  08/26/2014  Patient/family informed of [facility]' ownership interest in [facility], as well as of the fact that they are under no obligation to receive care at this facility.  PASARR submitted to EDS on 08/26/2014 PASARR number received on 08/26/2014  FL2 transmitted to all facilities in geographic area requested by pt/family on  08/26/2014 FL2 transmitted to all facilities within larger geographic area on 08/26/2014  Patient informed that his/her managed care company has contracts with or will negotiate with  certain facilities, including the following:     Patient/family informed of bed offers received: 08/28/2014 [facility] Physician recommends and patient chooses bed at    [facility] on  08/28/2014 Patient to be transferred to facility by [facility] Patient and family notified of transfer on 08/28/2014 Name of family member notified:  [name]    The following physician request were entered in Epic:   Additional Comments: Quartez Lagos, MSW, LCSWA [phone]

## 2014-08-27 NOTE — Progress Notes (Signed)
Patient ID: Andrea [name]  female  ZOX:[id number]    DOB: 1919/05/14    DOA: 08/25/2014  PCP: Thayer Headings, MD  Brief history of present illness Andrea [name] is a 80 y.o. female with Past medical history of COPD, dementia, hypertension.  The patient is presenting with recurrent fall. She was brought in by the family. As per the daughter who is the primary caregiver for the patient the patient had a fall 3 days ago while she was trying to hang her clothes in the closet. She did complain of back pain after the fall but she was still able to ambulate. There was no head injury at that point. After that the patient had 3 more falls, most of them were while ambulating and due to loss of balance. As per the daughter the patient had history of recurrent fall with one fall nearly every week during the summer and was admitted in [place] while she was there, she also has undergone physical therapy for the same and every fall she has been diagnosed with dehydration   Assessment/Plan: Principal Problem:   L2 vertebral fracture - CT of the lumbar spine showed Burst fracture of the superior endplate of L2. Bone fragments are retropulsed 4 mm into the canal without significant stenosis. Fractures extend through the pedicles bilaterally. - Neurosurgery consulted, patient was seen by Dr. Venetia Maxon, recommended TLSO brace, pain control, PTOT, placement and follow up outpatient  Active Problems: Acute encephalopathy on dementia - UA negative for UTI, patient crying and yelling in the room, placed on IV morphine for pain control and IV Ativan for agitation - Continue Lexapro, Wellbutrin, Namenda    Dehydration - Improved    Essential hypertension - BP likely elevated due to pain, agitation  - Continue losartan, added metoprolol     Recurrent falls PT recommending skilled nursing facility    DVT Prophylaxis: DO NOT RESUSCITATE   Code Status:  Family Communication:  Disposition: skilled  nursing facility   Consultants:  Neurosurgery   Procedures:  CT of the lumbar spine  Antibiotics:  None     Subjective: Patient seen and examined, crying and yellowing, "I want my daughter to be here"   Objective: Weight change: -1.678 kg (-3 lb 11.2 oz)  Intake/Output Summary (Last 24 hours) at 08/27/14 1222 Last data filed at 08/26/14 2200  Gross per 24 hour  Intake    320 ml  Output      0 ml  Net    320 ml   Blood pressure 173/90, pulse 76, temperature 98 F (36.7 C), temperature source Oral, resp. rate 20, height 5\' 6"  (1.676 m), weight 48.217 kg (106 lb 4.8 oz), SpO2 94.00%.  Physical Exam: General: Alert and awake,very confused . CVS: S1-S2 clear, no murmur rubs or gallops Chest: clear to auscultation bilaterally, no wheezing, rales or rhonchi Abdomen: soft nontender, nondistended, normal bowel sounds  Extremities: no cyanosis, clubbing or edema noted bilaterally  Lab Results: Basic Metabolic Panel:  Recent Labs Lab 08/25/14 1758 08/26/14 0615  NA 138 140  K 4.0 3.5*  CL 102 106  CO2 24 23  GLUCOSE 103* 93  BUN 31* 28*  CREATININE 1.24* 1.16*  CALCIUM 9.3 8.7   Liver Function Tests:  Recent Labs Lab 08/26/14 0615  AST 22  ALT 16  ALKPHOS 54  BILITOT 0.6  PROT 5.6*  ALBUMIN 3.3*   No results found for this basename: LIPASE, AMYLASE,  in the last 168 hours No results found for this basename:  AMMONIA,  in the last 168 hours CBC:  Recent Labs Lab 08/25/14 1758 08/26/14 0615  WBC 9.8 8.2  NEUTROABS 5.5  --   HGB 12.1 11.4*  HCT 36.2 33.9*  MCV 91.4 91.1  PLT 195 183   Cardiac Enzymes:  Recent Labs Lab 08/25/14 1758  TROPONINI <0.30   BNP: No components found with this basename: POCBNP,  CBG: No results found for this basename: GLUCAP,  in the last 168 hours   Micro Results: Recent Results (from the past 240 hour(s))  URINE CULTURE     Status: None   Collection Time    08/25/14  7:14 PM      Result Value Ref Range  Status   Specimen Description URINE, CATHETERIZED   Final   Special Requests NONE   Final   Culture  Setup Time     Final   Value: 08/26/2014 03:27     Performed at [facility] Count     Final   Value: NO GROWTH     Performed at [facility]   Culture     Final   Value: NO GROWTH     Performed at [facility]   Report Status 08/26/2014 FINAL   Final    Studies/Results: Dg Chest 2 View  08/25/2014   CLINICAL DATA:  Left chest pain following a fall today.  EXAM: CHEST  2 VIEW  COMPARISON:  06/17/2013.  FINDINGS: The heart remains normal in size. The lungs remain hyperexpanded and clear. Old, healed left rib fractures are again demonstrated. No acute fracture or pneumothorax seen. Diffuse osteopenia is noted as well as mid to lower thoracic spine degenerative changes. Right shoulder degenerative changes.  IMPRESSION: No acute abnormality. Stable changes of COPD. If there is a clinical concern for left rib fracture, left rib radiographs would be recommended.   Electronically Signed   By: Gordan Payment M.D.   On: 08/25/2014 18:42   Dg Lumbar Spine Complete  08/25/2014   CLINICAL DATA:  Low back pain following a fall today.  EXAM: LUMBAR SPINE - COMPLETE 4+ VIEW  COMPARISON:  Abdomen radiographs dated 12/05/2004.  FINDINGS: Five non-rib-bearing lumbar vertebrae. Interval approximately 50% compression deformity of the L2 vertebral body with mild bony retropulsion and possible acute fracture lines and fragments anteriorly. No additional fractures. No pars defects. Facet degenerative changes in the mid and lower lumbar spine with associated grade 1 anterolisthesis at the L3-4 level. Atheromatous arterial calcifications.  IMPRESSION: 1. Probably acute L2 vertebral body 50% compression fracture. This could be confirmed with a noncontrast a lumbar spine CT if clinically indicated. 2. Diffuse osteopenia. 3. Degenerative changes.   Electronically Signed   By: Gordan Payment M.D.    On: 08/25/2014 18:45   Dg Pelvis 1-2 Views  08/25/2014   CLINICAL DATA:  Left pelvic pain after falling.  Initial encounter.  EXAM: PELVIS - 1-2 VIEW  COMPARISON:  Abdominal radiographs 12/05/2004.  FINDINGS: The bones are diffusely demineralized. There is no evidence of acute fracture or sacroiliac joint diastasis. There are degenerative changes at the sacroiliac joints, both hips and the symphysis pubis. Radiodensities overlapping the pelvis are likely related to retained contrast within colonic diverticula.  IMPRESSION: No evidence of acute pelvic fracture or dislocation. Degenerative changes as described.   Electronically Signed   By: Roxy Horseman M.D.   On: 08/25/2014 18:42   Ct Head Wo Contrast  08/25/2014   CLINICAL DATA:  Fall.  Head  injury.  EXAM: CT HEAD WITHOUT CONTRAST  TECHNIQUE: Contiguous axial images were obtained from the base of the skull through the vertex without intravenous contrast.  COMPARISON:  07/09/2013.  FINDINGS: Low attenuation throughout the subcortical and periventricular white matter noted compatible with chronic small vessel ischemic change. There is prominence of the sulci and ventricles compatible with brain atrophy. There is no evidence for acute intracranial hemorrhage, infarct or mass. The paranasal sinuses appear clear. The mastoid air cells are clear. The skull is intact.  IMPRESSION: 1. No acute intracranial abnormalities. 2. Small vessel ischemic disease and brain atrophy.   Electronically Signed   By: Signa Kellaylor  Stroud M.D.   On: 08/25/2014 19:06   Ct Lumbar Spine Wo Contrast  08/25/2014   CLINICAL DATA:  Initial encounter for 3 falls during the last 3 days. Initial fall was a slip while trying to hang close at home. Patient landed on her buttocks. The fall was on the same level.  EXAM: CT LUMBAR SPINE WITHOUT CONTRAST  TECHNIQUE: Multidetector CT imaging of the lumbar spine was performed without intravenous contrast administration. Multiplanar CT image  reconstructions were also generated.  COMPARISON:  Lumbar spine radiographs from the same day.  FINDINGS: CT confirms an acute superior endplate fracture of L2. This is a burst fracture with posterior displaced fragments 4 mm from the expected wall of the posterior vertebral body is encroaches into the spinal canal without significant stenosis. The fractures extend into the pedicle bilaterally. Remaining vertebral bodies are intact. No additional fractures are present. Alignment is anatomic. Facet degenerative changes are present.  Limited imaging of the abdomen demonstrates significant atrophy of the left kidney. Atherosclerotic calcifications are present in the aorta without aneurysm. Degenerative changes are noted at the SI joints bilaterally.  IMPRESSION: 1. Burst fracture of the superior endplate of L2. Bone fragments are retropulsed 4 mm into the canal without significant stenosis. 2. Fractures extend through the pedicles bilaterally. 3. Degenerative changes through the lumbar spine without additional fracture. 4. Severe atrophy of the left kidney. 5. Atherosclerosis   Electronically Signed   By: Gennette Pachris  Mattern M.D.   On: 08/25/2014 21:14    Medications: Scheduled Meds: . buPROPion  300 mg Oral QPM  . escitalopram  20 mg Oral Daily  . heparin  5,000 Units Subcutaneous 3 times per day  . lidocaine  1 patch Transdermal Q24H  . losartan  100 mg Oral Daily  . memantine  10 mg Oral BID  . sodium chloride  3 mL Intravenous Q12H  . tiotropium  18 mcg Inhalation Daily      LOS: 2 days   RAI,RIPUDEEP M.D. [facility] 08/27/2014, 12:22 PM Pager: [phone]  If 7PM-7AM, please contact night-coverage [email or web address] Password [email or web address]

## 2014-08-27 NOTE — Progress Notes (Signed)
Physical Therapy Treatment Patient Details Name: Andrea [name] MRN: [id number] DOB: [birth year] Today's Date: 08/27/2014    History of Present Illness 80 y.o. female admitted to [facility] on 08/25/14 with dehydration and L2 fx after fall. PMH - dementia, COPD    PT Comments    Pt is progressing well with her mobility, but unassisted would still be a very high fall risk.  I am also open to the option of HHPT, but I think she would need 24/7 supervision/assist to be safe at home.  PT will continue to follow acutely.   Follow Up Recommendations  SNF (if 24/7 assist can be confirmed by daughter, then HHPT )     Equipment Recommendations  3in1 (PT)    Recommendations for Other Services   NA     Precautions / Restrictions Precautions Precautions: Fall Precaution Comments: lack of awareness to back precautions and h/o falls Required Braces or Orthoses: Spinal Brace Spinal Brace: Thoracolumbosacral orthotic;Other (comment) ("wear when up" per physician note) Spinal Brace Comments: No order for brace or where to donn it.     Mobility  Bed Mobility Overal bed mobility: Needs Assistance Bed Mobility: Rolling;Sidelying to Sit;Sit to Sidelying Rolling: Supervision Sidelying to sit: Min guard Supine to sit: Min assist   Sit to sidelying: Min guard General bed mobility comments: Pt needed cues  Transfers Overall transfer level: Needs assistance Equipment used: Rolling walker (2 wheeled) Transfers: Sit to/from Stand Sit to Stand: Min guard         General transfer comment: Min guard assist for safety as pt took multiple attempts to get to standing over flexed knees.  Verbal cues for safe hand placement.   Ambulation/Gait Ambulation/Gait assistance: Min guard Ambulation Distance (Feet): 180 Feet Assistive device: Rolling walker (2 wheeled) Gait Pattern/deviations: Step-through pattern;Shuffle;Trunk flexed Gait velocity: decreased   General Gait Details: Verbal cues to get  closer to the RW and for upright posture during gait.    Stairs Stairs: Yes Stairs assistance: Min assist Stair Management: No rails;Forwards;Step to pattern Number of Stairs: 2 General stair comments: Min hand held assist to get up and down the stairs. Min assist needed as pt tends to have posterior preference when on the steps and this puts her at risk of falling is she were to try unassisted.          Balance Overall balance assessment: Needs assistance Sitting-balance support: Feet supported;No upper extremity supported Sitting balance-Leahy Scale: Fair     Standing balance support: Bilateral upper extremity supported Standing balance-Leahy Scale: Poor Standing balance comment: needs external support to stand and walk safely                    Cognition Arousal/Alertness: Awake/alert Behavior During Therapy: Anxious Overall Cognitive Status: History of cognitive impairments - at baseline                             Pertinent Vitals/Pain Pain Assessment: No/denies pain (some grimacing with transitions)    Home Living Family/patient expects to be discharged to:: Private residence Living Arrangements: Children Available Help at Discharge: Family Type of Home: House Home Access: Stairs to enter Entrance Stairs-Rails: None Home Layout: Multi-level;Able to live on main level with bedroom/bathroom Home Equipment: Dan Humphreys - 2 wheels;Wheelchair - manual Additional Comments: no family present so question if information is accurate    Prior Function Level of Independence: Needs assistance  Gait / Transfers Assistance Needed: Amb with  rolling walker household distances and has had frequent falls.       PT Goals (current goals can now be found in the care plan section) Acute Rehab PT Goals Patient Stated Goal: to go home today Progress towards PT goals: Progressing toward goals    Frequency  Min 3X/week    PT Plan Current plan remains appropriate        End of Session Equipment Utilized During Treatment: Back brace Activity Tolerance: Patient tolerated treatment well Patient left: in bed;with call bell/phone within reach;with bed alarm set     Time: 1610-96041512-1525 PT Time Calculation (min): 13 min  Charges:  $Gait Training: 8-22 mins                      Daeshawn Redmann B. Latasia Silberstein, PT, DPT [phone]   08/27/2014, 3:36 PM

## 2014-08-28 MED ORDER — METOPROLOL TARTRATE 25 MG PO TABS: 12.5000 mg | ORAL_TABLET | Freq: Two times a day (BID) | ORAL | Status: DC

## 2014-08-28 MED ORDER — LOSARTAN POTASSIUM 100 MG PO TABS: 100.0000 mg | ORAL_TABLET | Freq: Every day | ORAL | Status: DC

## 2014-08-28 MED ORDER — ESCITALOPRAM OXALATE 20 MG PO TABS: 20.0000 mg | ORAL_TABLET | Freq: Every day | ORAL | Status: DC

## 2014-08-28 MED ORDER — BUPROPION HCL ER (XL) 300 MG PO TB24: 300.0000 mg | ORAL_TABLET | Freq: Every evening | ORAL | Status: DC

## 2014-08-28 MED ORDER — CLONAZEPAM 0.5 MG PO TABS: 0.2500 mg | ORAL_TABLET | Freq: Two times a day (BID) | ORAL | Status: DC | PRN

## 2014-08-28 NOTE — Discharge Summary (Signed)
Physician Discharge Summary  Patient ID: Andrea [name] MRN: [id number] DOB/AGE: 78/04/1919 78 y.o.  Admit date: 08/25/2014 Discharge date: 08/28/2014  Primary Care Physician:  Thayer Headings, MD  Discharge Diagnoses:    . L2 vertebral fracture . Dehydration . Essential hypertension . Orthostatic hypotension . Dementia  Consults:  Neurosurgery, Dr Venetia Maxon   Recommendations for Outpatient Follow-up:  Mobilize in TLSO brace, continue PT, OT  Allergies:   Allergies  Allergen Reactions  . Aspirin     Stomach upset     Discharge Medications:   Medication List    STOP taking these medications       ibuprofen 200 MG tablet  Commonly known as:  ADVIL,MOTRIN     losartan-hydrochlorothiazide 100-12.5 MG per tablet  Commonly known as:  HYZAAR      TAKE these medications       acetaminophen 325 MG tablet  Commonly known as:  TYLENOL  Take 2 tablets (650 mg total) by mouth every 6 (six) hours as needed for mild pain or moderate pain (or Fever >/= 101).     buPROPion 300 MG 24 hr tablet  Commonly known as:  WELLBUTRIN XL  Take 1 tablet (300 mg total) by mouth every evening.     clonazePAM 0.5 MG tablet  Commonly known as:  KLONOPIN  Take 0.5 tablets (0.25 mg total) by mouth 2 (two) times daily as needed for anxiety.     cyclobenzaprine 5 MG tablet  Commonly known as:  FLEXERIL  Take 1 tablet (5 mg total) by mouth 3 (three) times daily as needed for muscle spasms.     escitalopram 20 MG tablet  Commonly known as:  LEXAPRO  Take 1 tablet (20 mg total) by mouth daily.     losartan 100 MG tablet  Commonly known as:  COZAAR  Take 1 tablet (100 mg total) by mouth daily.     memantine 10 MG tablet  Commonly known as:  NAMENDA  Take 10 mg by mouth 2 (two) times daily.     metoprolol tartrate 25 MG tablet  Commonly known as:  LOPRESSOR  Take 0.5 tablets (12.5 mg total) by mouth 2 (two) times daily.     tiotropium 18 MCG inhalation capsule  Commonly known as:   SPIRIVA  Place 18 mcg into inhaler and inhale daily.     traMADol-acetaminophen 37.5-325 MG per tablet  Commonly known as:  ULTRACET  Take 1 tablet by mouth every 8 (eight) hours as needed for moderate pain.         Brief H and P: For complete details please refer to admission H and P, but in brief Andrea [name] is a 80 y.o. female with Past medical history of COPD, dementia, hypertension.  The patient presented with recurrent fall. She was brought in by the family. As per the daughter who is the primary caregiver for the patient the patient had a fall 3 days ago while she was trying to hang her clothes in the closet. She did complain of back pain after the fall but she was still able to ambulate. There was no head injury at that point. After that the patient had 3 more falls, most of them were while ambulating and due to loss of balance. As per the daughter the patient had history of recurrent fall with one fall nearly every week during the summer and was admitted in [place] while she was there, she also has undergone physical therapy for the same and every fall she has  been diagnosed with dehydration.   Hospital Course:  L2 vertebral fracture  - CT of the lumbar spine showed Burst fracture of the superior endplate of L2. Bone fragments are retropulsed 4 mm into the canal without significant stenosis. Fractures extend through the pedicles bilaterally. Neurosurgery was consulted. Patient was seen by Dr. Venetia Maxon, recommended TLSO brace, pain control, PTOT, placement and follow up outpatient in 1 week. The patient was able to ambulate with physical therapy, recommended skilled nursing facility or 24/7 assistance, 3 IN1  Acute encephalopathy on dementia : Significantly improved today - UA negative for UTI, continue Lexapro, Wellbutrin, Namenda , Klonopin  Dehydration  - Improved , patient was placed on gentle hydration  Essential hypertension:  BP likely was elevated due to pain,  agitation. Due to dehydration and mild acute kidney injury, creatinine 1.2, HCTZ was discontinued. Continue losartan, added metoprolol.   Recurrent falls: PT recommending skilled nursing facility    Day of Discharge BP 143/66  Pulse 62  Temp(Src) 98.6 F (37 C) (Oral)  Resp 18  Ht 5\' 6"  (1.676 m)  Wt 46.675 kg (102 lb 14.4 oz)  BMI 16.62 kg/m2  SpO2 95%  Physical Exam: General: Alert and awake oriented to self CVS: S1-S2 clear no murmur rubs or gallops Chest: ctab Abdomen: soft nontender, nondistended, normal bowel sounds Extremities: no cyanosis, clubbing or edema noted bilaterally Neuro: Cranial nerves II-XII intact, no focal neurological deficits   The results of significant diagnostics from this hospitalization (including imaging, microbiology, ancillary and laboratory) are listed below for reference.    LAB RESULTS: Basic Metabolic Panel:  Recent Labs Lab 08/25/14 1758 08/26/14 0615  NA 138 140  K 4.0 3.5*  CL 102 106  CO2 24 23  GLUCOSE 103* 93  BUN 31* 28*  CREATININE 1.24* 1.16*  CALCIUM 9.3 8.7   Liver Function Tests:  Recent Labs Lab 08/26/14 0615  AST 22  ALT 16  ALKPHOS 54  BILITOT 0.6  PROT 5.6*  ALBUMIN 3.3*   No results found for this basename: LIPASE, AMYLASE,  in the last 168 hours No results found for this basename: AMMONIA,  in the last 168 hours CBC:  Recent Labs Lab 08/25/14 1758 08/26/14 0615  WBC 9.8 8.2  NEUTROABS 5.5  --   HGB 12.1 11.4*  HCT 36.2 33.9*  MCV 91.4 91.1  PLT 195 183   Cardiac Enzymes:  Recent Labs Lab 08/25/14 1758  TROPONINI <0.30   BNP: No components found with this basename: POCBNP,  CBG: No results found for this basename: GLUCAP,  in the last 168 hours  Significant Diagnostic Studies:  Dg Chest 2 View  08/25/2014   CLINICAL DATA:  Left chest pain following a fall today.  EXAM: CHEST  2 VIEW  COMPARISON:  06/17/2013.  FINDINGS: The heart remains normal in size. The lungs remain  hyperexpanded and clear. Old, healed left rib fractures are again demonstrated. No acute fracture or pneumothorax seen. Diffuse osteopenia is noted as well as mid to lower thoracic spine degenerative changes. Right shoulder degenerative changes.  IMPRESSION: No acute abnormality. Stable changes of COPD. If there is a clinical concern for left rib fracture, left rib radiographs would be recommended.   Electronically Signed   By: Gordan Payment M.D.   On: 08/25/2014 18:42   Dg Lumbar Spine Complete  08/25/2014   CLINICAL DATA:  Low back pain following a fall today.  EXAM: LUMBAR SPINE - COMPLETE 4+ VIEW  COMPARISON:  Abdomen radiographs dated 12/05/2004.  FINDINGS: Five non-rib-bearing lumbar vertebrae. Interval approximately 50% compression deformity of the L2 vertebral body with mild bony retropulsion and possible acute fracture lines and fragments anteriorly. No additional fractures. No pars defects. Facet degenerative changes in the mid and lower lumbar spine with associated grade 1 anterolisthesis at the L3-4 level. Atheromatous arterial calcifications.  IMPRESSION: 1. Probably acute L2 vertebral body 50% compression fracture. This could be confirmed with a noncontrast a lumbar spine CT if clinically indicated. 2. Diffuse osteopenia. 3. Degenerative changes.   Electronically Signed   By: Gordan Payment M.D.   On: 08/25/2014 18:45   Dg Pelvis 1-2 Views  08/25/2014   CLINICAL DATA:  Left pelvic pain after falling.  Initial encounter.  EXAM: PELVIS - 1-2 VIEW  COMPARISON:  Abdominal radiographs 12/05/2004.  FINDINGS: The bones are diffusely demineralized. There is no evidence of acute fracture or sacroiliac joint diastasis. There are degenerative changes at the sacroiliac joints, both hips and the symphysis pubis. Radiodensities overlapping the pelvis are likely related to retained contrast within colonic diverticula.  IMPRESSION: No evidence of acute pelvic fracture or dislocation. Degenerative changes as  described.   Electronically Signed   By: Roxy Horseman M.D.   On: 08/25/2014 18:42   Ct Head Wo Contrast  08/25/2014   CLINICAL DATA:  Fall.  Head injury.  EXAM: CT HEAD WITHOUT CONTRAST  TECHNIQUE: Contiguous axial images were obtained from the base of the skull through the vertex without intravenous contrast.  COMPARISON:  07/09/2013.  FINDINGS: Low attenuation throughout the subcortical and periventricular white matter noted compatible with chronic small vessel ischemic change. There is prominence of the sulci and ventricles compatible with brain atrophy. There is no evidence for acute intracranial hemorrhage, infarct or mass. The paranasal sinuses appear clear. The mastoid air cells are clear. The skull is intact.  IMPRESSION: 1. No acute intracranial abnormalities. 2. Small vessel ischemic disease and brain atrophy.   Electronically Signed   By: Signa Kell M.D.   On: 08/25/2014 19:06   Ct Lumbar Spine Wo Contrast  08/25/2014   CLINICAL DATA:  Initial encounter for 3 falls during the last 3 days. Initial fall was a slip while trying to hang close at home. Patient landed on her buttocks. The fall was on the same level.  EXAM: CT LUMBAR SPINE WITHOUT CONTRAST  TECHNIQUE: Multidetector CT imaging of the lumbar spine was performed without intravenous contrast administration. Multiplanar CT image reconstructions were also generated.  COMPARISON:  Lumbar spine radiographs from the same day.  FINDINGS: CT confirms an acute superior endplate fracture of L2. This is a burst fracture with posterior displaced fragments 4 mm from the expected wall of the posterior vertebral body is encroaches into the spinal canal without significant stenosis. The fractures extend into the pedicle bilaterally. Remaining vertebral bodies are intact. No additional fractures are present. Alignment is anatomic. Facet degenerative changes are present.  Limited imaging of the abdomen demonstrates significant atrophy of the left kidney.  Atherosclerotic calcifications are present in the aorta without aneurysm. Degenerative changes are noted at the SI joints bilaterally.  IMPRESSION: 1. Burst fracture of the superior endplate of L2. Bone fragments are retropulsed 4 mm into the canal without significant stenosis. 2. Fractures extend through the pedicles bilaterally. 3. Degenerative changes through the lumbar spine without additional fracture. 4. Severe atrophy of the left kidney. 5. Atherosclerosis   Electronically Signed   By: Gennette Pac M.D.   On: 08/25/2014 21:14  Disposition and Follow-up:     Discharge Instructions   Diet - low sodium heart healthy    Complete by:  As directed      Increase activity slowly    Complete by:  As directed             DISPOSITION: Skilled nursing facility  DIET: Heart healthy    DISCHARGE FOLLOW-UP Follow-up Information   Follow up with Thayer HeadingsMACKENZIE,BRIAN, MD. Schedule an appointment as soon as possible for a visit in 1 week.   Specialty:  Internal Medicine   Contact information:   [address], [address] [place] [address] [phone]       Follow up with Dorian HeckleSTERN,JOSEPH D, MD. Schedule an appointment as soon as possible for a visit in 1 week.   Specialty:  Neurosurgery   Contact information:   [address] [place] [address] [phone]       Time spent on Discharge: 40 MINS  Signed:   RAI,RIPUDEEP M.D. [facility] 08/28/2014, 10:03 AM Pager: [phone]

## 2014-08-28 NOTE — Progress Notes (Signed)
Subjective: Patient reports "I don't hurt.  I just want to go home today and see my daughter"  Objective: Vital signs in last 24 hours: Temp:  [97.4 F (36.3 C)-99 F (37.2 C)] 98.6 F (37 C) (10/08 0545) Pulse Rate:  [58-76] 58 (10/08 0545) Resp:  [18-20] 18 (10/08 0545) BP: (145-173)/(69-90) 168/70 mmHg (10/08 0545) SpO2:  [94 %-98 %] 95 % (10/08 0545) Weight:  [46.675 kg (102 lb 14.4 oz)] 46.675 kg (102 lb 14.4 oz) (10/08 0500)  Intake/Output from previous day: 10/07 0701 - 10/08 0700 In: 360 [P.O.:360] Out: -  Intake/Output this shift:    Alert, anxious but smiling. Denies any pain or discomfort. Good strength BLE. Voices her disdain for TLSO but verbalizes understanding of the need for its use when OOB.   Lab Results:  Recent Labs  08/25/14 1758 08/26/14 0615  WBC 9.8 8.2  HGB 12.1 11.4*  HCT 36.2 33.9*  PLT 195 183   BMET  Recent Labs  08/25/14 1758 08/26/14 0615  NA 138 140  K 4.0 3.5*  CL 102 106  CO2 24 23  GLUCOSE 103* 93  BUN 31* 28*  CREATININE 1.24* 1.16*  CALCIUM 9.3 8.7    Studies/Results: No results found.  Assessment/Plan:   LOS: 3 days  TLSO when out of bed. Discussion apparently continues re:SNF vs home with daughter.   Georgiann Cockeroteat, Zaira Iacovelli 08/28/2014, 9:03 AM

## 2014-08-28 NOTE — Progress Notes (Signed)
Discharge instructions received.  [facility].  Pt dressed and IV removed.  Awaiting PTAR for transport.  Attempted to call report.  Will continue to monitor. Sondra ComeSilva, Roanna Reaves M, RN 08/28/2014 2:59 PM

## 2014-08-28 NOTE — Clinical Social Work Note (Signed)
CSW left several messages for the pt's daughter [name] at [phone] cell as it is going straight to voice mail. CSW call the house at [phone] where a female reported Misty StanleyLisa was not home. CSW will continue contact NocateeLisa.   Torre Schaumburg, MSW, LCSWA [phone]

## 2014-08-28 NOTE — Progress Notes (Signed)
Occupational Therapy Treatment Patient Details Name: Andrea LazierGeraldine [name] MRN: [id number] DOB: Mar 22, 1919 Today's Date: 08/28/2014    History of present illness 80 y.o. [facility] with dehydration and L2 fx after fall. PMH - dementia, COPD   OT comments  Pt demonstrates sink level, toilet transfer and LB dressing this session. Pt remains with deficits donning TLSO. NO family present at this time and pt poor historian to help with daughter arrival. Recommendation remains for SNF due to no [facility] to ask questions. OT to follow acutely for adl retraining and dynamic standing balance.   Follow Up Recommendations  SNF    Equipment Recommendations  Other (comment)    Recommendations for Other Services      Precautions / Restrictions Precautions Precautions: Fall Precaution Comments: lack of awareness to back precautions and h/o falls Required Braces or Orthoses: Spinal Brace Spinal Brace: Thoracolumbosacral orthotic;Other (comment) Spinal Brace Comments: brace don in supine       Mobility Bed Mobility Overal bed mobility: Needs Assistance Bed Mobility: Rolling;Supine to Sit Rolling: Supervision Sidelying to sit: Min guard       General bed mobility comments: Pt required total (A) to don TLSO but able to roll to (A)  Transfers Overall transfer level: Needs assistance Equipment used: Rolling walker (2 wheeled) Transfers: Sit to/from Stand Sit to Stand: Min guard         General transfer comment: cues for hand placement in bathroom and from bed    Balance Overall balance assessment: Needs assistance         Standing balance support: No upper extremity supported;During functional activity Standing balance-Leahy Scale: Poor                     ADL Overall ADL's : Needs assistance/impaired     Grooming: Oral care;Wash/dry face;Brushing hair;Minimal assistance;Standing;Sitting Grooming Details (indicate cue type and  reason): pt static standing for oral care and needs cues not to bed and sitting to complete hair due to inability to release with bil UE static standing     Lower Body Bathing: Minimal assistance;Sit to/from stand Lower Body Bathing Details (indicate cue type and reason): peri care at sink level     Lower Body Dressing: Moderate assistance;Sit to/from stand Lower Body Dressing Details (indicate cue type and reason): don socks with cues to avoid bending             Functional mobility during ADLs: Minimal assistance;Rolling walker General ADL Comments: pt demonstrates sink level task, toilet transfer, and peri care this session. pt verbalizes dislike of TLSO but helping (A) donning brace. Pt in chair reading book and respiratory therapist arriving for breathing treatment      Vision                     Perception     Praxis      Cognition   Behavior During Therapy: Restless Overall Cognitive Status: History of cognitive impairments - at baseline                       Extremity/Trunk Assessment               Exercises     Shoulder Instructions       General Comments      Pertinent Vitals/ Pain       Pain Assessment: No/denies pain  Home Living  Prior Functioning/Environment              Frequency Min 2X/week     Progress Toward Goals  OT Goals(current goals can now be found in the care plan section)  Progress towards OT goals: Progressing toward goals  Acute Rehab OT Goals Patient Stated Goal: to go home today OT Goal Formulation: Patient unable to participate in goal setting Time For Goal Achievement: 09/10/14 Potential to Achieve Goals: Good ADL Goals Pt Will Perform Upper Body Bathing: with supervision;bed level Pt Will Perform Lower Body Bathing: with min assist;sit to/from stand Pt Will Transfer to Toilet: with min guard assist Additional ADL Goal #1: Pt will don  brace with family (A) supervision level  Plan Discharge plan remains appropriate    Co-evaluation                 End of Session Equipment Utilized During Treatment: Gait belt;Rolling walker;Back brace   Activity Tolerance Patient tolerated treatment well   Patient Left in chair;with call bell/phone within reach;with chair alarm set   Nurse Communication Mobility status;Precautions        Time:  -     Charges:  2 self care  [name] 08/28/2014, 2:49 PM Pager: [phone]

## 2014-08-28 NOTE — Progress Notes (Signed)
Less confused/disoriented today.

## 2014-09-12 ENCOUNTER — Emergency Department (HOSPITAL_COMMUNITY): Payer: Medicare Other

## 2014-09-12 ENCOUNTER — Encounter (HOSPITAL_COMMUNITY): Payer: Self-pay | Admitting: Emergency Medicine

## 2014-09-12 ENCOUNTER — Inpatient Hospital Stay (HOSPITAL_COMMUNITY)
Admission: EM | Admit: 2014-09-12 | Discharge: 2014-09-15 | DRG: 291 | Disposition: A | Discharge status: Skilled Nursing Facility | Payer: Medicare Other | Attending: Internal Medicine | Admitting: Internal Medicine

## 2014-09-12 DIAGNOSIS — F0280 Dementia in other diseases classified elsewhere without behavioral disturbance: Secondary | ICD-10-CM | POA: Diagnosis present

## 2014-09-12 DIAGNOSIS — J9601 Acute respiratory failure with hypoxia: Secondary | ICD-10-CM

## 2014-09-12 DIAGNOSIS — F03918 Unspecified dementia, unspecified severity, with other behavioral disturbance: Secondary | ICD-10-CM | POA: Diagnosis present

## 2014-09-12 DIAGNOSIS — J449 Chronic obstructive pulmonary disease, unspecified: Secondary | ICD-10-CM | POA: Diagnosis present

## 2014-09-12 DIAGNOSIS — J9600 Acute respiratory failure, unspecified whether with hypoxia or hypercapnia: Secondary | ICD-10-CM | POA: Diagnosis present

## 2014-09-12 DIAGNOSIS — I129 Hypertensive chronic kidney disease with stage 1 through stage 4 chronic kidney disease, or unspecified chronic kidney disease: Secondary | ICD-10-CM | POA: Diagnosis present

## 2014-09-12 DIAGNOSIS — F329 Major depressive disorder, single episode, unspecified: Secondary | ICD-10-CM | POA: Diagnosis present

## 2014-09-12 DIAGNOSIS — R0602 Shortness of breath: Secondary | ICD-10-CM

## 2014-09-12 DIAGNOSIS — N183 Chronic kidney disease, stage 3 unspecified: Secondary | ICD-10-CM

## 2014-09-12 DIAGNOSIS — J810 Acute pulmonary edema: Secondary | ICD-10-CM

## 2014-09-12 DIAGNOSIS — I5023 Acute on chronic systolic (congestive) heart failure: Principal | ICD-10-CM | POA: Diagnosis present

## 2014-09-12 DIAGNOSIS — I517 Cardiomegaly: Secondary | ICD-10-CM

## 2014-09-12 DIAGNOSIS — F410 Panic disorder [episodic paroxysmal anxiety] without agoraphobia: Secondary | ICD-10-CM | POA: Diagnosis present

## 2014-09-12 DIAGNOSIS — R0609 Other forms of dyspnea: Secondary | ICD-10-CM

## 2014-09-12 DIAGNOSIS — R0600 Dyspnea, unspecified: Secondary | ICD-10-CM | POA: Diagnosis present

## 2014-09-12 DIAGNOSIS — R0989 Other specified symptoms and signs involving the circulatory and respiratory systems: Secondary | ICD-10-CM

## 2014-09-12 DIAGNOSIS — G309 Alzheimer's disease, unspecified: Secondary | ICD-10-CM | POA: Diagnosis present

## 2014-09-12 DIAGNOSIS — Z66 Do not resuscitate: Secondary | ICD-10-CM | POA: Diagnosis present

## 2014-09-12 DIAGNOSIS — J189 Pneumonia, unspecified organism: Secondary | ICD-10-CM

## 2014-09-12 DIAGNOSIS — R918 Other nonspecific abnormal finding of lung field: Secondary | ICD-10-CM

## 2014-09-12 DIAGNOSIS — I5020 Unspecified systolic (congestive) heart failure: Secondary | ICD-10-CM

## 2014-09-12 DIAGNOSIS — F0391 Unspecified dementia with behavioral disturbance: Secondary | ICD-10-CM | POA: Diagnosis present

## 2014-09-12 DIAGNOSIS — F0390 Unspecified dementia without behavioral disturbance: Secondary | ICD-10-CM

## 2014-09-12 DIAGNOSIS — R0603 Acute respiratory distress: Secondary | ICD-10-CM

## 2014-09-12 DIAGNOSIS — I10 Essential (primary) hypertension: Secondary | ICD-10-CM | POA: Diagnosis present

## 2014-09-12 DIAGNOSIS — I509 Heart failure, unspecified: Secondary | ICD-10-CM

## 2014-09-12 DIAGNOSIS — I5040 Unspecified combined systolic (congestive) and diastolic (congestive) heart failure: Secondary | ICD-10-CM

## 2014-09-12 HISTORY — DX: Pneumonia, unspecified organism: J18.9

## 2014-09-12 HISTORY — DX: Acute respiratory failure, unspecified whether with hypoxia or hypercapnia: J96.00

## 2014-09-12 LAB — CBC
HCT: 40.8 % (ref 36.0–46.0)
Hemoglobin: 13.1 g/dL (ref 12.0–15.0)
MCH: 30.6 pg (ref 26.0–34.0)
MCHC: 32.1 g/dL (ref 30.0–36.0)
MCV: 95.3 fL (ref 78.0–100.0)
PLATELETS: 359 10*3/uL (ref 150–400)
RBC: 4.28 MIL/uL (ref 3.87–5.11)
RDW: 14.5 % (ref 11.5–15.5)
WBC: 13.3 10*3/uL — AB (ref 4.0–10.5)

## 2014-09-12 LAB — I-STAT CHEM 8, ED
BUN: 25 mg/dL — ABNORMAL HIGH (ref 6–23)
CALCIUM ION: 1.19 mmol/L (ref 1.13–1.30)
Chloride: 103 mEq/L (ref 96–112)
Creatinine, Ser: 1.3 mg/dL — ABNORMAL HIGH (ref 0.50–1.10)
Glucose, Bld: 181 mg/dL — ABNORMAL HIGH (ref 70–99)
HCT: 43 % (ref 36.0–46.0)
Hemoglobin: 14.6 g/dL (ref 12.0–15.0)
Potassium: 4.3 mEq/L (ref 3.7–5.3)
Sodium: 137 mEq/L (ref 137–147)
TCO2: 28 mmol/L (ref 0–100)

## 2014-09-12 LAB — CBC WITH DIFFERENTIAL/PLATELET
BAND NEUTROPHILS: 0 % (ref 0–10)
BASOS ABS: 0.2 10*3/uL — AB (ref 0.0–0.1)
BASOS PCT: 1 % (ref 0–1)
Blasts: 0 %
EOS PCT: 2 % (ref 0–5)
Eosinophils Absolute: 0.4 10*3/uL (ref 0.0–0.7)
HEMATOCRIT: 41.5 % (ref 36.0–46.0)
HEMOGLOBIN: 13.1 g/dL (ref 12.0–15.0)
LYMPHS ABS: 9 10*3/uL — AB (ref 0.7–4.0)
LYMPHS PCT: 41 % (ref 12–46)
MCH: 30.5 pg (ref 26.0–34.0)
MCHC: 31.6 g/dL (ref 30.0–36.0)
MCV: 96.5 fL (ref 78.0–100.0)
Metamyelocytes Relative: 0 %
Monocytes Absolute: 0.9 10*3/uL (ref 0.1–1.0)
Monocytes Relative: 4 % (ref 3–12)
Myelocytes: 0 %
Neutro Abs: 11.4 10*3/uL — ABNORMAL HIGH (ref 1.7–7.7)
Neutrophils Relative %: 52 % (ref 43–77)
Platelets: 500 10*3/uL — ABNORMAL HIGH (ref 150–400)
Promyelocytes Absolute: 0 %
RBC: 4.3 MIL/uL (ref 3.87–5.11)
RDW: 14.4 % (ref 11.5–15.5)
WBC: 21.9 10*3/uL — AB (ref 4.0–10.5)
nRBC: 0 /100 WBC

## 2014-09-12 LAB — PATHOLOGIST SMEAR REVIEW

## 2014-09-12 LAB — MAGNESIUM: Magnesium: 2 mg/dL (ref 1.5–2.5)

## 2014-09-12 LAB — BASIC METABOLIC PANEL
ANION GAP: 16 — AB (ref 5–15)
BUN: 24 mg/dL — ABNORMAL HIGH (ref 6–23)
CALCIUM: 9.7 mg/dL (ref 8.4–10.5)
CO2: 28 meq/L (ref 19–32)
Chloride: 97 mEq/L (ref 96–112)
Creatinine, Ser: 1.33 mg/dL — ABNORMAL HIGH (ref 0.50–1.10)
GFR, EST AFRICAN AMERICAN: 39 mL/min — AB (ref >90)
GFR, EST NON AFRICAN AMERICAN: 33 mL/min — AB (ref >90)
Glucose, Bld: 95 mg/dL (ref 70–99)
Potassium: 4 mEq/L (ref 3.7–5.3)
SODIUM: 141 meq/L (ref 137–147)

## 2014-09-12 LAB — I-STAT TROPONIN, ED: TROPONIN I, POC: 0.04 ng/mL (ref 0.00–0.08)

## 2014-09-12 LAB — STREP PNEUMONIAE URINARY ANTIGEN: STREP PNEUMO URINARY ANTIGEN: NEGATIVE

## 2014-09-12 LAB — PROTIME-INR
INR: 1.05 (ref 0.00–1.49)
PROTHROMBIN TIME: 13.8 s (ref 11.6–15.2)

## 2014-09-12 LAB — TROPONIN I: Troponin I: <0.3 ng/mL (ref <0.30)

## 2014-09-12 LAB — PRO B NATRIURETIC PEPTIDE: Pro B Natriuretic peptide (BNP): 9765 pg/mL — ABNORMAL HIGH (ref 0–450)

## 2014-09-12 MED ORDER — VANCOMYCIN HCL IN DEXTROSE 1-5 GM/200ML-% IV SOLN
1000.0000 mg | Freq: Once | INTRAVENOUS | Status: AC
  Administered 2014-09-12: 1000 mg via INTRAVENOUS
  Filled 2014-09-12: qty 200

## 2014-09-12 MED ORDER — PIPERACILLIN-TAZOBACTAM 3.375 G IVPB 30 MIN
3.3750 g | Freq: Once | INTRAVENOUS | Status: AC
  Administered 2014-09-12: 3.375 g via INTRAVENOUS
  Filled 2014-09-12: qty 50

## 2014-09-12 MED ORDER — SODIUM CHLORIDE 0.9 % IJ SOLN
3.0000 mL | INTRAMUSCULAR | Status: DC | PRN
  Administered 2014-09-15: 3 mL via INTRAVENOUS

## 2014-09-12 MED ORDER — TIOTROPIUM BROMIDE MONOHYDRATE 18 MCG IN CAPS
18.0000 ug | ORAL_CAPSULE | Freq: Every day | RESPIRATORY_TRACT | Status: DC
  Administered 2014-09-12 – 2014-09-15 (×4): 18 ug via RESPIRATORY_TRACT
  Filled 2014-09-12 (×2): qty 5

## 2014-09-12 MED ORDER — CEFEPIME HCL 1 G IJ SOLR
1.0000 g | Freq: Three times a day (TID) | INTRAMUSCULAR | Status: DC
Start: 2014-09-12 — End: 2014-09-12

## 2014-09-12 MED ORDER — METOPROLOL TARTRATE 12.5 MG HALF TABLET
12.5000 mg | ORAL_TABLET | Freq: Two times a day (BID) | ORAL | Status: DC
  Administered 2014-09-12 – 2014-09-15 (×7): 12.5 mg via ORAL
  Filled 2014-09-12 (×6): qty 1
  Filled 2014-09-12: qty 0.5
  Filled 2014-09-12 (×2): qty 1

## 2014-09-12 MED ORDER — NITROGLYCERIN 2 % TD OINT: 1.0000 [in_us] | TOPICAL_OINTMENT | Freq: Four times a day (QID) | TRANSDERMAL | Status: DC

## 2014-09-12 MED ORDER — ALBUTEROL SULFATE (2.5 MG/3ML) 0.083% IN NEBU
INHALATION_SOLUTION | RESPIRATORY_TRACT | Status: AC
  Administered 2014-09-12: 2.5 mg via RESPIRATORY_TRACT
  Filled 2014-09-12: qty 3

## 2014-09-12 MED ORDER — ESCITALOPRAM OXALATE 20 MG PO TABS
20.0000 mg | ORAL_TABLET | Freq: Every day | ORAL | Status: DC
  Administered 2014-09-12 – 2014-09-15 (×4): 20 mg via ORAL
  Filled 2014-09-12: qty 2
  Filled 2014-09-12 (×3): qty 1

## 2014-09-12 MED ORDER — DULOXETINE HCL 30 MG PO CPEP
30.0000 mg | ORAL_CAPSULE | Freq: Every day | ORAL | Status: DC
  Administered 2014-09-12 – 2014-09-13 (×2): 30 mg via ORAL
  Filled 2014-09-12 (×2): qty 1

## 2014-09-12 MED ORDER — SODIUM CHLORIDE 0.9 % IV SOLN: 250.0000 mL | INTRAVENOUS | Status: DC | PRN

## 2014-09-12 MED ORDER — ENSURE COMPLETE PO LIQD
237.0000 mL | Freq: Two times a day (BID) | ORAL | Status: DC
  Administered 2014-09-13 – 2014-09-14 (×3): 237 mL via ORAL

## 2014-09-12 MED ORDER — LOSARTAN POTASSIUM 50 MG PO TABS
100.0000 mg | ORAL_TABLET | Freq: Every day | ORAL | Status: DC
  Administered 2014-09-12 – 2014-09-15 (×4): 100 mg via ORAL
  Filled 2014-09-12 (×5): qty 2

## 2014-09-12 MED ORDER — FUROSEMIDE 10 MG/ML IJ SOLN
40.0000 mg | Freq: Once | INTRAMUSCULAR | Status: DC
  Filled 2014-09-12: qty 4

## 2014-09-12 MED ORDER — BUPROPION HCL ER (XL) 300 MG PO TB24
300.0000 mg | ORAL_TABLET | Freq: Every evening | ORAL | Status: DC
  Administered 2014-09-12 – 2014-09-14 (×3): 300 mg via ORAL
  Filled 2014-09-12 (×4): qty 1

## 2014-09-12 MED ORDER — SODIUM CHLORIDE 0.9 % IJ SOLN
3.0000 mL | Freq: Two times a day (BID) | INTRAMUSCULAR | Status: DC
  Administered 2014-09-12 – 2014-09-15 (×6): 3 mL via INTRAVENOUS

## 2014-09-12 MED ORDER — QUETIAPINE FUMARATE 25 MG PO TABS
25.0000 mg | ORAL_TABLET | Freq: Every day | ORAL | Status: DC
  Administered 2014-09-12 – 2014-09-14 (×3): 25 mg via ORAL
  Filled 2014-09-12 (×6): qty 1

## 2014-09-12 MED ORDER — CYCLOBENZAPRINE HCL 5 MG PO TABS: 5.0000 mg | ORAL_TABLET | Freq: Three times a day (TID) | ORAL | Status: DC | PRN

## 2014-09-12 MED ORDER — DULOXETINE HCL 60 MG PO CPEP
60.0000 mg | ORAL_CAPSULE | Freq: Every day | ORAL | Status: DC
  Administered 2014-09-12 – 2014-09-13 (×2): 60 mg via ORAL
  Filled 2014-09-12 (×2): qty 1

## 2014-09-12 MED ORDER — NITROGLYCERIN 2 % TD OINT
1.0000 [in_us] | TOPICAL_OINTMENT | Freq: Once | TRANSDERMAL | Status: AC
  Administered 2014-09-12: 1 [in_us] via TOPICAL
  Filled 2014-09-12: qty 1

## 2014-09-12 MED ORDER — HALOPERIDOL 2 MG PO TABS
2.0000 mg | ORAL_TABLET | ORAL | Status: DC | PRN
  Filled 2014-09-12: qty 1

## 2014-09-12 MED ORDER — VANCOMYCIN HCL 500 MG IV SOLR
500.0000 mg | INTRAVENOUS | Status: DC
  Administered 2014-09-13: 500 mg via INTRAVENOUS
  Filled 2014-09-12: qty 500

## 2014-09-12 MED ORDER — MEMANTINE HCL 10 MG PO TABS
10.0000 mg | ORAL_TABLET | Freq: Two times a day (BID) | ORAL | Status: DC
  Administered 2014-09-12 – 2014-09-15 (×7): 10 mg via ORAL
  Filled 2014-09-12 (×9): qty 1

## 2014-09-12 MED ORDER — TRAMADOL-ACETAMINOPHEN 37.5-325 MG PO TABS
1.0000 | ORAL_TABLET | Freq: Three times a day (TID) | ORAL | Status: DC | PRN
Start: 2014-09-12 — End: 2014-09-15

## 2014-09-12 MED ORDER — ALBUTEROL SULFATE (2.5 MG/3ML) 0.083% IN NEBU
2.5000 mg | INHALATION_SOLUTION | RESPIRATORY_TRACT | Status: DC
  Administered 2014-09-12 (×4): 2.5 mg via RESPIRATORY_TRACT
  Filled 2014-09-12 (×3): qty 3

## 2014-09-12 MED ORDER — ALBUTEROL SULFATE (2.5 MG/3ML) 0.083% IN NEBU
2.5000 mg | INHALATION_SOLUTION | Freq: Three times a day (TID) | RESPIRATORY_TRACT | Status: DC
  Administered 2014-09-13 (×3): 2.5 mg via RESPIRATORY_TRACT
  Filled 2014-09-12 (×4): qty 3

## 2014-09-12 MED ORDER — CLONAZEPAM 0.5 MG PO TABS
0.2500 mg | ORAL_TABLET | Freq: Two times a day (BID) | ORAL | Status: DC | PRN
  Administered 2014-09-13 – 2014-09-14 (×2): 0.25 mg via ORAL
  Filled 2014-09-12 (×2): qty 1

## 2014-09-12 MED ORDER — DEXTROSE 5 % IV SOLN
1.0000 g | INTRAVENOUS | Status: DC
  Administered 2014-09-13: 1 g via INTRAVENOUS
  Filled 2014-09-12 (×2): qty 1

## 2014-09-12 MED ORDER — HEPARIN SODIUM (PORCINE) 5000 UNIT/ML IJ SOLN
5000.0000 [IU] | Freq: Three times a day (TID) | INTRAMUSCULAR | Status: DC
  Administered 2014-09-12 – 2014-09-15 (×10): 5000 [IU] via SUBCUTANEOUS
  Filled 2014-09-12 (×13): qty 1

## 2014-09-12 MED ORDER — FUROSEMIDE 10 MG/ML IJ SOLN
40.0000 mg | Freq: Once | INTRAMUSCULAR | Status: AC
  Administered 2014-09-12: 40 mg via INTRAVENOUS

## 2014-09-12 MED ORDER — ACETAMINOPHEN 325 MG PO TABS: 650.0000 mg | ORAL_TABLET | Freq: Four times a day (QID) | ORAL | Status: DC | PRN

## 2014-09-12 NOTE — ED Provider Notes (Signed)
CSN: [id number]     Arrival date & time 09/12/14  0050 History   First MD Initiated Contact with Patient 09/12/14 0057     Chief Complaint  Patient presents with  . Respiratory Distress     (Consider location/radiation/quality/duration/timing/severity/associated sxs/prior Treatment) Patient is a 80 y.o. female presenting with shortness of breath. The history is provided by the EMS personnel. The history is limited by the condition of the patient. No language interpreter was used.  Shortness of Breath Severity:  Severe Onset quality:  Gradual Timing:  Constant Progression:  Unchanged Chronicity:  New Context: not activity   Relieved by:  Nothing Worsened by:  Nothing tried Ineffective treatments:  None tried Associated symptoms: no abdominal pain   Risk factors: no recent alcohol use     Past Medical History  Diagnosis Date  . COPD (chronic obstructive pulmonary disease)   . Alzheimer's dementia   . Depression   . Chronic kidney disease   . Hypertension    History reviewed. No pertinent past surgical history. Family History  Problem Relation Age of Onset  . Suicidality Other    History  Substance Use Topics  . Smoking status: Never Smoker   . Smokeless tobacco: Not on file  . Alcohol Use: No   OB History   Grav Para Term Preterm Abortions TAB SAB Ect Mult Living                 Review of Systems  Unable to perform ROS Respiratory: Positive for shortness of breath.   Gastrointestinal: Negative for abdominal pain.      Allergies  Aspirin  Home Medications   Prior to Admission medications   Medication Sig Start Date End Date Taking? Authorizing Provider  acetaminophen (TYLENOL) 325 MG tablet Take 2 tablets (650 mg total) by mouth every 6 (six) hours as needed for mild pain or moderate pain (or Fever >/= 101). 08/26/14   Richarda OverlieNayana Abrol, MD  buPROPion (WELLBUTRIN XL) 300 MG 24 hr tablet Take 1 tablet (300 mg total) by mouth every evening. 08/28/14   Ripudeep Jenna LuoK  Rai, MD  clonazePAM (KLONOPIN) 0.5 MG tablet Take 0.5 tablets (0.25 mg total) by mouth 2 (two) times daily as needed for anxiety. 08/28/14   Ripudeep Jenna LuoK Rai, MD  cyclobenzaprine (FLEXERIL) 5 MG tablet Take 1 tablet (5 mg total) by mouth 3 (three) times daily as needed for muscle spasms. 08/26/14   Richarda OverlieNayana Abrol, MD  escitalopram (LEXAPRO) 20 MG tablet Take 1 tablet (20 mg total) by mouth daily. 08/28/14   Ripudeep Jenna LuoK Rai, MD  losartan (COZAAR) 100 MG tablet Take 1 tablet (100 mg total) by mouth daily. 08/28/14   Ripudeep Jenna LuoK Rai, MD  memantine (NAMENDA) 10 MG tablet Take 10 mg by mouth 2 (two) times daily.    Historical Provider, MD  metoprolol tartrate (LOPRESSOR) 25 MG tablet Take 0.5 tablets (12.5 mg total) by mouth 2 (two) times daily. 08/28/14   Ripudeep Jenna LuoK Rai, MD  tiotropium (SPIRIVA) 18 MCG inhalation capsule Place 18 mcg into inhaler and inhale daily.    Historical Provider, MD  traMADol-acetaminophen (ULTRACET) 37.5-325 MG per tablet Take 1 tablet by mouth every 8 (eight) hours as needed for moderate pain. 08/26/14   Richarda OverlieNayana Abrol, MD   There were no vitals taken for this visit. Physical Exam  Constitutional: She appears well-developed and well-nourished.  HENT:  Head: Normocephalic and atraumatic.  Eyes: Conjunctivae are normal. Pupils are equal, round, and reactive to light.  Neck: Normal  range of motion. Neck supple.  Cardiovascular: Normal rate, regular rhythm and intact distal pulses.   Pulmonary/Chest: No stridor. She is in respiratory distress. She has rales.  Abdominal: Soft. Bowel sounds are normal. There is no tenderness. There is no rebound and no guarding.  Musculoskeletal: Normal range of motion. She exhibits no edema.  Neurological: She is alert.  Skin: Skin is warm and dry. She is not diaphoretic.    ED Course  Procedures (including critical care time) Labs Review Labs Reviewed  CBC WITH DIFFERENTIAL  PRO B NATRIURETIC PEPTIDE  I-STAT CHEM 8, ED  I-STAT TROPOININ, ED     Imaging Review No results found.   EKG Interpretation   Date/Time:  Friday September 12 2014 00:58:53 EDT Ventricular Rate:  85 PR Interval:  197 QRS Duration: 84 QT Interval:  413 QTC Calculation: 491 R Axis:   73 Text Interpretation:  Sinus rhythm Probable left atrial enlargement  Anteroseptal infarct, age indeterminate Confirmed by [facility]-RASCH  MD,  Waymond Meador ([id number]) on 09/12/2014 1:02:23 AM      MDM   Final diagnoses:  Rales    MDM Number of Diagnoses or Management Options Rales:  Respiratory distress:  Critical Care Total time providing critical care: 30-74 minutes MDM Reviewed: nursing note and vitals Interpretation: ECG, x-ray and labs (elevated white count and BNP) Total time providing critical care: 30-74 minutes. This excludes time spent performing separately reportable procedures and services. Consults: admitting MD   Medications  vancomycin (VANCOCIN) IVPB 1000 mg/200 mL premix (not administered)  piperacillin-tazobactam (ZOSYN) IVPB 3.375 g (not administered)  furosemide (LASIX) injection 40 mg (40 mg Intravenous Given 09/12/14 0109)  nitroGLYCERIN (NITROGLYN) 2 % ointment 1 inch (1 inch Topical Given 09/12/14 0108)  bipap initiated.   CRITICAL CARE Performed by: Jasmine AwePALUMBO-RASCH,Jerilynn Feldmeier K Total critical care time: 31 minutes Critical care time was exclusive of separately billable procedures and treating other patients. Critical care was necessary to treat or prevent imminent or life-threatening deterioration. Critical care was time spent personally by me on the following activities: development of treatment plan with patient and/or surrogate as well as nursing, discussions with consultants, evaluation of patient's response to treatment, examination of patient, obtaining history from patient or surrogate, ordering and performing treatments and interventions, ordering and review of laboratory studies, ordering and review of radiographic studies, pulse  oximetry and re-evaluation of patient's condition.  Per Dr. Clyde LundborgNiu will admit to step down     Marigold Mom K Adriene Padula-Rasch, MD 09/12/14 [phone]

## 2014-09-12 NOTE — ED Notes (Signed)
RT note: Sputum collection device labelled/placed in room/explained to pt./

## 2014-09-12 NOTE — Progress Notes (Signed)
  Echocardiogram 2D Echocardiogram has been performed.  Georgian CoWILLIAMS, Kaprice Kage 09/12/2014, 5:40 PM

## 2014-09-12 NOTE — ED Notes (Signed)
Pt admitted to [facility] room 30

## 2014-09-12 NOTE — ED Notes (Signed)
Pt pulled Bipap mask off and stating "i wet myself". Pt able to follow commands to put mask back on. RT at the bedside.

## 2014-09-12 NOTE — ED Notes (Signed)
Pharmacy called for morning medications.

## 2014-09-12 NOTE — Progress Notes (Signed)
ANTIBIOTIC CONSULT NOTE - INITIAL  Pharmacy Consult for Vancomycin and Cefepime Indication: HCAP  Allergies  Allergen Reactions  . Aspirin     Stomach upset    Patient Measurements:   Ht: 66 in  Wt: ~47 kg  Vital Signs: BP: 127/69 mmHg (10/23 0800) Pulse Rate: 59 (10/23 0800) Intake/Output from previous day: 10/22 0701 - 10/23 0700 In: 250 [I.V.:250] Out: -  Intake/Output from this shift:    Labs:  Recent Labs  09/12/14 0101 09/12/14 0124  WBC 21.9*  --   HGB 13.1 14.6  PLT 500*  --   CREATININE  --  1.30*   The CrCl is unknown because both a height and weight (above a minimum accepted value) are required for this calculation. No results found for this basename: Rolm GalaVANCOTROUGH, VANCOPEAK, VANCORANDOM, GENTTROUGH, GENTPEAK, GENTRANDOM, TOBRATROUGH, TOBRAPEAK, TOBRARND, AMIKACINPEAK, AMIKACINTROU, AMIKACIN,  in the last 72 hours   Microbiology: Recent Results (from the past 720 hour(s))  URINE CULTURE     Status: None   Collection Time    08/25/14  7:14 PM      Result Value Ref Range Status   Specimen Description URINE, CATHETERIZED   Final   Special Requests NONE   Final   Culture  Setup Time     Final   Value: 08/26/2014 03:27     Performed at [facility]   Colony Count     Final   Value: NO GROWTH     Performed at [facility]   Culture     Final   Value: NO GROWTH     Performed at [facility]   Report Status 08/26/2014 FINAL   Final    Medical History: Past Medical History  Diagnosis Date  . COPD (chronic obstructive pulmonary disease)   . Alzheimer's dementia   . Depression   . Chronic kidney disease   . Hypertension     Medications:  See electronic med rec  Assessment: 80 y.o. F presents from NH with cough, SOB. WBC 29.9. CXR shows pneumonia. Pt received Vanc 1gm and Zosyn 3.375gm ~0300 in ED. To continue broad spectrum antibiotics (Vanc and Cefepime) for HCAP. Pt with CKD stage 3. SCr 1.3, est CrCl ~20 ml/min.  Cultures pending.  Goal of Therapy:  Vancomycin trough level 15-20 mcg/ml  Plan:  1. Change Cefepime to 1gm IV q24h x 8 days 2. Vancomycin 500mg  IV q24h. Next dose 10/24 ~0600 3. Will f/u renal function, pt's clinical condition, micro data 4. Vanc trough prn  Christoper Fabianaron Everlee Quakenbush, PharmD, BCPS Clinical pharmacist, pager [phone] 09/12/2014,8:28 AM

## 2014-09-12 NOTE — Progress Notes (Signed)
Patient's daughter requesting that patient not return to [facility] at discharge.  Indicated that [facility] has [facility] at [facility] and my be interested in sending her there instead.

## 2014-09-12 NOTE — Progress Notes (Signed)
Orthopedic Tech Progress Note Patient Details:  Andrea LazierGeraldine [name] [birth year] [id number] Patient has brace family to bring Patient ID: Andrea [name], female   DOB: [birth year], 80 y.o.   MRN: [id number]   Andrea [name], Andrea [name] 09/12/2014, 4:23 PM

## 2014-09-12 NOTE — ED Notes (Signed)
Removed nitro paste d/t BP.

## 2014-09-12 NOTE — ED Notes (Signed)
Spoke to Dr. Butler Denmarkizwan about transferring pt to telemetry.

## 2014-09-12 NOTE — ED Notes (Signed)
[facility] she has been having SOB that started tonight. Patient has rales present bilaterally as well as a cough. Hx of CHF. BP 218/130 and EMS gave 1 SL Nitro and BP now 198/118. HR 84. Denies CP.

## 2014-09-12 NOTE — ED Notes (Signed)
Dr. Rizwan at the bedside.  

## 2014-09-12 NOTE — ED Notes (Signed)
Dr. Butler Denmarkizwan removed oxygen and wants to leave pt on continuous pulse ox saturation.

## 2014-09-12 NOTE — H&P (Signed)
[facility] History and Physical  Andrea LazierGeraldine [name] AVW:[id number] DOB: 1919/05/09 DOA: 09/12/2014  Referring physician: ED physician PCP: Thayer HeadingsMACKENZIE,BRIAN, MD  Specialists:   Chief Complaint: cough and sob  HPI: Andrea LazierGeraldine [name] is a 80 y.o. female with past medical history of COPD, dementia, hypertension, CKD-III, Alzheimer's disease, CHF, depression, who presents with cough and shortness of breath.  Patient is a nursing home resident [facility] At her normal baseline, she uses a walker to walk around. She has been doing well until last night when patient started having cough and shortness of breath. She feels hot, but denies chills, chest pain. She does not have nausea, vomiting, abdominal pain, diarrhea.  She was found to have leukocytosis with WBC 29.9. Chest x-ray showed pneumonia. Pro BNP 9765. Troponin negative. She had significantly elevated blood pressure at 218/130, which gradually decreased to SBP ~ 120 mmHg after received SL Nitro by EMS and nitroglycerin patch by ED. She is admitted as an inpatient for further evaluation and treatment  Review of Systems: As presented in the history of presenting illness, rest negative.  Where does patient live?  SNF Can patient participate in ADLs? none  Allergy:  Allergies  Allergen Reactions  . Aspirin     Stomach upset    Past Medical History  Diagnosis Date  . COPD (chronic obstructive pulmonary disease)   . Alzheimer's dementia   . Depression   . Chronic kidney disease   . Hypertension     History reviewed. No pertinent past surgical history.  Social History:  reports that she has never smoked. She does not have any smokeless tobacco history on file. She reports that she does not drink alcohol or use illicit drugs.  Family History:  Family History  Problem Relation Age of Onset  . Suicidality Other      Prior to Admission medications   Medication Sig Start Date End Date Taking? Authorizing  Provider  acetaminophen (TYLENOL) 325 MG tablet Take 2 tablets (650 mg total) by mouth every 6 (six) hours as needed for mild pain or moderate pain (or Fever >/= 101). 08/26/14  Yes Richarda OverlieNayana Abrol, MD  buPROPion (WELLBUTRIN XL) 300 MG 24 hr tablet Take 1 tablet (300 mg total) by mouth every evening. 08/28/14  Yes Ripudeep Jenna LuoK Rai, MD  clonazePAM (KLONOPIN) 0.5 MG tablet Take 0.5 tablets (0.25 mg total) by mouth 2 (two) times daily as needed for anxiety. 08/28/14  Yes Ripudeep Jenna LuoK Rai, MD  cyclobenzaprine (FLEXERIL) 5 MG tablet Take 1 tablet (5 mg total) by mouth 3 (three) times daily as needed for muscle spasms. 08/26/14  Yes Richarda OverlieNayana Abrol, MD  DULoxetine (CYMBALTA) 30 MG capsule Take 30 mg by mouth daily.   Yes Historical Provider, MD  DULoxetine (CYMBALTA) 60 MG capsule Take 60 mg by mouth daily.   Yes Historical Provider, MD  escitalopram (LEXAPRO) 20 MG tablet Take 1 tablet (20 mg total) by mouth daily. 08/28/14  Yes Ripudeep Jenna LuoK Rai, MD  haloperidol (HALDOL) 2 MG tablet Take 2 mg by mouth every 4 (four) hours as needed for agitation.   Yes Historical Provider, MD  haloperidol (HALDOL) 2 MG/ML solution Take 2 mg by mouth every 4 (four) hours as needed for agitation.   Yes Historical Provider, MD  losartan (COZAAR) 100 MG tablet Take 1 tablet (100 mg total) by mouth daily. 08/28/14  Yes Ripudeep Jenna LuoK Rai, MD  memantine (NAMENDA) 10 MG tablet Take 10 mg by mouth 2 (two) times daily.   Yes Historical Provider,  MD  metoprolol tartrate (LOPRESSOR) 25 MG tablet Take 0.5 tablets (12.5 mg total) by mouth 2 (two) times daily. 08/28/14  Yes Ripudeep Jenna Luo, MD  PRESCRIPTION MEDICATION Take 90 mLs by mouth 2 (two) times daily. Med Pass   Yes Historical Provider, MD  QUEtiapine (SEROQUEL) 25 MG tablet Take 25 mg by mouth at bedtime.   Yes Historical Provider, MD  tiotropium (SPIRIVA) 18 MCG inhalation capsule Place 18 mcg into inhaler and inhale daily.   Yes Historical Provider, MD  traMADol-acetaminophen (ULTRACET) 37.5-325 MG  per tablet Take 1 tablet by mouth every 8 (eight) hours as needed for moderate pain. 08/26/14  Yes Richarda Overlie, MD    Physical Exam: Filed Vitals:   09/12/14 0111 09/12/14 0130 09/12/14 0215 09/12/14 0245  BP: 173/94 125/78 119/69 112/61  Pulse: 79 71 68 69  Resp: 27 20 22 20   SpO2: 99% 98% 94% 99%   General: Not in acute distress HEENT:       Eyes: PERRL, EOMI, no scleral icterus       ENT: No discharge from the ears and nose, no pharynx injection, no tonsillar enlargement.        Neck: No JVD, no bruit, no mass felt. Cardiac: S1/S2, RRR, No murmurs, gallops or rubs Pulm: bilaterally rales posteriorly, No wheezing or rubs. Abd: Soft, nondistended, nontender, no rebound pain, no organomegaly, BS present Ext: No edema. 2+DP/PT pulse bilaterally Musculoskeletal: No joint deformities, erythema, or stiffness, ROM full Skin: No rashes.  Neuro: Alert and oriented only to place, cranial nerves II-XII grossly intact, moves all extremities Psych: Patient is not psychotic, no suicidal or hemocidal ideation.  Labs on Admission:  Basic Metabolic Panel:  Recent Labs Lab 09/12/14 0124  NA 137  K 4.3  CL 103  GLUCOSE 181*  BUN 25*  CREATININE 1.30*   Liver Function Tests: No results found for this basename: AST, ALT, ALKPHOS, BILITOT, PROT, ALBUMIN,  in the last 168 hours No results found for this basename: LIPASE, AMYLASE,  in the last 168 hours No results found for this basename: AMMONIA,  in the last 168 hours CBC:  Recent Labs Lab 09/12/14 0101 09/12/14 0124  WBC 21.9*  --   NEUTROABS 11.4*  --   HGB 13.1 14.6  HCT 41.5 43.0  MCV 96.5  --   PLT 500*  --    Cardiac Enzymes: No results found for this basename: CKTOTAL, CKMB, CKMBINDEX, TROPONINI,  in the last 168 hours  BNP (last 3 results)  Recent Labs  09/12/14 0102  PROBNP 9765.0*   CBG: No results found for this basename: GLUCAP,  in the last 168 hours  Radiological Exams on Admission: Dg Chest Portable 1  View  09/12/2014   CLINICAL DATA:  Shortness of breath.  EXAM: PORTABLE CHEST - 1 VIEW  COMPARISON:  08/25/2014  FINDINGS: Heart size and pulmonary vascularity are normal. Emphysematous changes in the lungs with scattered fibrosis. Interval development of airspace disease in the right lung base suggesting developing focal pneumonia. No blunting of costophrenic angles. No pneumothorax. Multiple old left rib fractures. Calcification of the aorta. Degenerative changes in the spine and shoulders.  IMPRESSION: New focal airspace disease in the right lung base suggesting pneumonia. Underlying emphysematous changes in the lungs.   Electronically Signed   By: Burman Nieves M.D.   On: 09/12/2014 01:34    EKG: Independently reviewed. T wave inversion in V1 and V2, also aVL, flatened T-wave in lead I  Assessment/Plan Principal Problem:  HCAP (healthcare-associated pneumonia) Active Problems:   Essential hypertension   Dementia without behavioral disturbance   CKD (chronic kidney disease), stage III   CHF (congestive heart failure)  # HCAP: Patient's cough and SOB are most likely caused by HCAP, as evidenced by CXR findings and leukocytosis. She may also have had acute CHF given her significantly elevated blood pressure and elevated proBNP.  - will admit to SDU - IV vancomycin plus cefepime and - Culture x 2 - will get urine legionella and S. pneumococcal antigen - will treat with albuterol prn and continue home  spirava  - BiPAP prn  # sCHF: 2-D echo on 12/06/04 showed EF 40-50%. He has an elevated troponin 9765 on admission. She does not have leg edema, does not look like fluid overloaded. She may have acute CHF in the setting of significantly elevated blood pressure. Patient received 1 dose of Lasix, 40 mg by IV. She was started with nitroglycerin patch by ED. Current SBP is around 120 mmHg. She has new TWI, but no chest pain. - will continue home bp meds: Losartan, metoprolol - repeat 2D  echo - trop x 3  # CKD-III: Baseline creatinine 1.2-1.4. On admission, her creatinine is 1.3, which is at the baseline. -monitor renal Fx by BMP  #: HTN:  - continue home meds - will watch bp closely, if bp decreases further, will need to discontinue bp medications to avoid intracranial hypoperfusion.   # Dementia: Patient does not have any behavioral issues at present. - Continue home medications. - Monitor for delirium  DVT ppx: SQ Heparin    Code Status: DNR (OK with BiPAP and antibiotics) Family Communication: None at bed side. Disposition Plan: Admit to inpatient   Date of Service 09/12/2014    Lorretta HarpIU, Shaka Cardin [facility] Pager [phone]  If 7PM-7AM, please contact night-coverage [email or web address] Password [email or web address] 09/12/2014, 3:16 AM

## 2014-09-12 NOTE — ED Notes (Signed)
Phlebotomy at the bedside  

## 2014-09-12 NOTE — Progress Notes (Signed)
INITIAL NUTRITION ASSESSMENT  DOCUMENTATION CODES Per approved criteria  -Severe malnutrition in the context of chronic illness   Pt meets criteria for severe MALNUTRITION in the context of chronic illness as evidenced by severe fat and muscle depletion.  INTERVENTION: Ensure Complete po BID, each supplement provides 350 kcal and 13 grams of protein  NUTRITION DIAGNOSIS: Inadequate oral intake related to decreased appetite as evidenced by severe fat and muscle depletion.   Goal: Pt will meet >90% of estimated nutritional needs  Monitor:  PO/supplement intake, labs, weight changes, I/O's  Reason for Assessment: MST=3  80 y.o. female  Admitting Dx: HCAP (healthcare-associated pneumonia)  Andrea [name] is a 80 y.o. female with COPD, HTN, CKD 3, chronic systolic CHF (not on Lasix or ACE I) admitted for acute onset of dyspnea. She was found to have a significantly elevated BP. She admitted to the admitted doctor of having a cough. Her daughter is concerned that the patient may have had a panic attack as she saw her yesterday and the patient appeared to be fine during the day. Dyspnea started suddenly in the evening. She was brought to the ER around midnight. She was given antibiotics, Lasix, neb treatments and a Nitroglycerin patch. She was placed on a BiPAP on arrival to the ER and has now been weaned off to 2 L.  ASSESSMENT: Pt is a resident of [facility] who was admitted with HCAP. She has been started on IV antibiotics.  Pt confirms poor appetite at baseline. She reports that she has lost weight, but is unable to quantify time frame or amount of wt loss. She reveals UBW of 112#, but is unable to determine the last time she weighed that.  Pt reports she ate a regular diet PTA and has no difficulty chewing or swallowing. She denies taking any supplements.  Discussed importance of good PO intake to promote healing. Suggested adding a nutrition supplement, however, pt  was initially reluctant because she was unsure "if that was better than meals". Explained purpose of nutrition supplement to help maximize intake during times of poor appetite. Discussed available options and pt agreeable to Ensure.  Labs reviewed. BUN/Creat: 24/1.33.  Nutrition Focused Physical Exam:  Subcutaneous Fat:  Orbital Region: severe depletion Upper Arm Region: severe depletion Thoracic and Lumbar Region: severe depletion  Muscle:  Temple Region: severe depletion Clavicle Bone Region: severe depletion Clavicle and Acromion Bone Region: severe depletion Scapular Bone Region: severe depletion Dorsal Hand: severe depletion Patellar Region:severe depletion Anterior Thigh Region: severe depletion Posterior Calf Region: severe depletion  Edema: none present  Height: Ht Readings from Last 1 Encounters:  09/12/14 5\' 5"  (1.651 m)    Weight: Wt Readings from Last 1 Encounters:  09/12/14 103 lb (46.72 kg)    Ideal Body Weight: 125#  % Ideal Body Weight: 82%  Wt Readings from Last 10 Encounters:  09/12/14 103 lb (46.72 kg)  08/28/14 102 lb 14.4 oz (46.675 kg)    Usual Body Weight: 112#  % Usual Body Weight: 92%  BMI:  Body mass index is 17.14 kg/(m^2). Underweight  Estimated Nutritional Needs: Kcal: 1400-1600 Protein: 56-66 grams Fluid: 1.4-1.6 L  Skin: Intact, noted redness on sacrum  Diet Order: Cardiac  EDUCATION NEEDS: -Education needs addressed   Intake/Output Summary (Last 24 hours) at 09/12/14 1542 Last data filed at 09/12/14 1350  Gross per 24 hour  Intake    370 ml  Output      0 ml  Net    370  ml    Last BM: PTA  Labs:   Recent Labs Lab 09/12/14 0124 09/12/14 0842  NA 137 141  K 4.3 4.0  CL 103 97  CO2  --  28  BUN 25* 24*  CREATININE 1.30* 1.33*  CALCIUM  --  9.7  MG  --  2.0  GLUCOSE 181* 95    CBG (last 3)  No results found for this basename: GLUCAP,  in the last 72 hours  Scheduled Meds: . albuterol  2.5 mg  Nebulization Q4H  . albuterol      . buPROPion  300 mg Oral QPM  . ceFEPime (MAXIPIME) IV  1 g Intravenous Q24H  . DULoxetine  30 mg Oral Daily  . DULoxetine  60 mg Oral Daily  . escitalopram  20 mg Oral Daily  . heparin  5,000 Units Subcutaneous 3 times per day  . losartan  100 mg Oral Daily  . memantine  10 mg Oral BID  . metoprolol tartrate  12.5 mg Oral BID  . QUEtiapine  25 mg Oral QHS  . sodium chloride  3 mL Intravenous Q12H  . tiotropium  18 mcg Inhalation Daily  . [START ON 09/13/2014] vancomycin  500 mg Intravenous Q24H    Continuous Infusions:   Past Medical History  Diagnosis Date  . COPD (chronic obstructive pulmonary disease)   . Alzheimer's dementia   . Depression   . Chronic kidney disease   . Hypertension     History reviewed. No pertinent past surgical history.  Henslee Lottman A. Mayford KnifeWilliams, RD, LDN Pager: [phone] After hours Pager: [phone]

## 2014-09-12 NOTE — Progress Notes (Addendum)
[facility] Progress Note   Andrea [name] ZOX:[id number] DOB: 1919-05-25 DOA: 09/12/2014 PCP: Thayer HeadingsMACKENZIE,BRIAN, MD  Brief narrative: Andrea [name] is a 80 y.o. female with COPD, HTN, CKD 3, chronic systolic CHF (not on Lasix or ACE I) admitted for acute onset of dyspnea. She was found to have a significantly elevated BP. She admitted to the admitted doctor of having a cough. Her daughter is concerned that the patient may have had a panic attack as she saw her yesterday and the patient appeared to be fine during the day. Dyspnea started suddenly in the evening. She was brought to the ER around midnight. She was given antibiotics, Lasix, neb treatments and a Nitroglycerin patch. She was placed on a BiPAP on arrival to the ER and has now been weaned off to 2 L.   Subjective: No c/o dyspnea. States today that she has not had a cough- daughter was with her yesterday and did not not any cough or dyspnea during the day.   Assessment/Plan: Principal Problem:   Acute respiratory failure - suspecting hypertension induced flash pulmonary edema vs pneumonia-she is asymptomatic as far as pneumonia (no fever/ cough) but did have a RLL infiltrate and leukocytosis - no crackles on exam currently - currently will continue antibiotics - follow BP - ECHO in 2006 reveals EF 40-50%- no need for further Lasix at this time- will repeat CXR tomorrow AM- f/u on ECHO  Active Problems:   Essential hypertension - stable at this time- cont Losartan, Metoprolol    Dementia without behavioral disturbance - watch for agitation- Haldol PRN ordered    CKD (chronic kidney disease), stage III - mild Cr elevation with Lasix - follow  Recent L2 fracture - cont TLSO brace - cont PT- will return to rehab when stable  Code Status:  DNR Family Communication: daughter Disposition Plan: return to nursing home in 1-2 days DVT prophylaxis:  Heparin  Consultants: none  Procedures: none  Antibiotics: Anti-infectives   Start     Dose/Rate Route Frequency Ordered Stop   09/13/14 0600  vancomycin (VANCOCIN) 500 mg in sodium chloride 0.9 % 100 mL IVPB     500 mg 100 mL/hr over 60 Minutes Intravenous Every 24 hours 09/12/14 0837     09/12/14 0900  ceFEPIme (MAXIPIME) 1 g in dextrose 5 % 50 mL IVPB     1 g 100 mL/hr over 30 Minutes Intravenous Every 24 hours 09/12/14 0836 09/20/14 0859   09/12/14 0830  ceFEPIme (MAXIPIME) 1 g in dextrose 5 % 50 mL IVPB  Status:  Discontinued     1 g 100 mL/hr over 30 Minutes Intravenous 3 times per day 09/12/14 0816 09/12/14 0836   09/12/14 0200  vancomycin (VANCOCIN) IVPB 1000 mg/200 mL premix     1,000 mg 200 mL/hr over 60 Minutes Intravenous  Once 09/12/14 0151 09/12/14 0411   09/12/14 0200  piperacillin-tazobactam (ZOSYN) IVPB 3.375 g     3.375 g 100 mL/hr over 30 Minutes Intravenous  Once 09/12/14 0151 09/12/14 0411         Objective: Filed Weights   09/12/14 1246  Weight: 46.72 kg (103 lb)    Intake/Output Summary (Last 24 hours) at 09/12/14 1431 Last data filed at 09/12/14 0411  Gross per 24 hour  Intake    250 ml  Output      0 ml  Net    250 ml     Vitals Filed Vitals:   09/12/14 1145 09/12/14 1204 09/12/14 1246 09/12/14 1328  BP: 139/76 139/76 150/65   Pulse: 66 67 74   Temp:   98.3 F (36.8 C)   TempSrc:   Oral   Resp: 17     Height:   5\' 5"  (1.651 m)   Weight:   46.72 kg (103 lb)   SpO2: 89%  100% 95%    Exam: General: No acute respiratory distress- poor historian Lungs: Clear to auscultation bilaterally without wheezes or crackles Cardiovascular: Regular rate and rhythm without murmur gallop or rub normal S1 and S2 Abdomen: Nontender, nondistended, soft, bowel sounds positive, no rebound, no ascites, no appreciable mass Extremities: No significant cyanosis, clubbing, or edema bilateral lower extremities  Data Reviewed: Basic Metabolic  Panel:  Recent Labs Lab 09/12/14 0124 09/12/14 0842  NA 137 141  K 4.3 4.0  CL 103 97  CO2  --  28  GLUCOSE 181* 95  BUN 25* 24*  CREATININE 1.30* 1.33*  CALCIUM  --  9.7  MG  --  2.0   Liver Function Tests: No results found for this basename: AST, ALT, ALKPHOS, BILITOT, PROT, ALBUMIN,  in the last 168 hours No results found for this basename: LIPASE, AMYLASE,  in the last 168 hours No results found for this basename: AMMONIA,  in the last 168 hours CBC:  Recent Labs Lab 09/12/14 0101 09/12/14 0124 09/12/14 0842  WBC 21.9*  --  13.3*  NEUTROABS 11.4*  --   --   HGB 13.1 14.6 13.1  HCT 41.5 43.0 40.8  MCV 96.5  --  95.3  PLT 500*  --  359   Cardiac Enzymes:  Recent Labs Lab 09/12/14 0842  TROPONINI <0.30   BNP (last 3 results)  Recent Labs  09/12/14 0102  PROBNP 9765.0*   CBG: No results found for this basename: GLUCAP,  in the last 168 hours  No results found for this or any previous visit (from the past 240 hour(s)).   Studies:  Recent x-ray studies have been reviewed in detail by the Attending Physician  Scheduled Meds:  Scheduled Meds: . albuterol  2.5 mg Nebulization Q4H  . buPROPion  300 mg Oral QPM  . ceFEPime (MAXIPIME) IV  1 g Intravenous Q24H  . DULoxetine  30 mg Oral Daily  . DULoxetine  60 mg Oral Daily  . escitalopram  20 mg Oral Daily  . heparin  5,000 Units Subcutaneous 3 times per day  . losartan  100 mg Oral Daily  . memantine  10 mg Oral BID  . metoprolol tartrate  12.5 mg Oral BID  . QUEtiapine  25 mg Oral QHS  . sodium chloride  3 mL Intravenous Q12H  . tiotropium  18 mcg Inhalation Daily  . [START ON 09/13/2014] vancomycin  500 mg Intravenous Q24H   Continuous Infusions:   Time spent on care of this patient: 35 min   Marigrace Mccole, MD 09/12/2014, 2:31 PM  LOS: 0 days   [facility] Office  [phone] Pager - Text Page per [email or web address]  If 7PM-7AM, please contact  night-coverage [email or web address]

## 2014-09-12 NOTE — Progress Notes (Signed)
Family informed to bring the TLSO brace from home. Janee Mornhompson, Aarav Burgett E  09/12/2014  4:10 PM

## 2014-09-12 NOTE — ED Notes (Signed)
Changed pt brief, gown and bedding

## 2014-09-13 ENCOUNTER — Inpatient Hospital Stay (HOSPITAL_COMMUNITY): Payer: Medicare Other

## 2014-09-13 DIAGNOSIS — N183 Chronic kidney disease, stage 3 (moderate): Secondary | ICD-10-CM

## 2014-09-13 DIAGNOSIS — J9601 Acute respiratory failure with hypoxia: Secondary | ICD-10-CM

## 2014-09-13 DIAGNOSIS — R918 Other nonspecific abnormal finding of lung field: Secondary | ICD-10-CM

## 2014-09-13 LAB — CBC
HCT: 36.6 % (ref 36.0–46.0)
HEMOGLOBIN: 11.6 g/dL — AB (ref 12.0–15.0)
MCH: 30.2 pg (ref 26.0–34.0)
MCHC: 31.7 g/dL (ref 30.0–36.0)
MCV: 95.3 fL (ref 78.0–100.0)
PLATELETS: 339 10*3/uL (ref 150–400)
RBC: 3.84 MIL/uL — AB (ref 3.87–5.11)
RDW: 14.4 % (ref 11.5–15.5)
WBC: 10.4 10*3/uL (ref 4.0–10.5)

## 2014-09-13 LAB — BASIC METABOLIC PANEL
ANION GAP: 14 (ref 5–15)
BUN: 25 mg/dL — ABNORMAL HIGH (ref 6–23)
CALCIUM: 9.2 mg/dL (ref 8.4–10.5)
CO2: 27 mEq/L (ref 19–32)
Chloride: 99 mEq/L (ref 96–112)
Creatinine, Ser: 1.38 mg/dL — ABNORMAL HIGH (ref 0.50–1.10)
GFR calc Af Amer: 37 mL/min — ABNORMAL LOW (ref >90)
GFR calc non Af Amer: 32 mL/min — ABNORMAL LOW (ref >90)
GLUCOSE: 87 mg/dL (ref 70–99)
POTASSIUM: 4.1 meq/L (ref 3.7–5.3)
SODIUM: 140 meq/L (ref 137–147)

## 2014-09-13 MED ORDER — FUROSEMIDE 10 MG/ML IJ SOLN: 20.0000 mg | Freq: Once | INTRAMUSCULAR | Status: DC

## 2014-09-13 NOTE — Clinical Social Work Psychosocial (Signed)
Clinical Social Work Department BRIEF PSYCHOSOCIAL ASSESSMENT 09/13/2014  Patient:  Andrea [name],Andrea [name]     Account Number:  [id number]     Admit date:  09/12/2014  Clinical Social Worker:  Delmer IslamRAWFORD,Fabiano Ginley, LCSW  Date/Time:  09/13/2014 07:36 AM  Referred by:  Physician  Date Referred:  09/13/2014 Referred for  SNF Placement   Other Referral:   Interview type:  Family Other interview type:    PSYCHOSOCIAL DATA Living Status:  FACILITY Admitted from [facility] Level of care:  Skilled Nursing Facility Primary support name:  Andrea [name] Primary support relationship to patient:  CHILD, ADULT Degree of support available:   Good support    CURRENT CONCERNS Current Concerns  Post-Acute Placement   Other Concerns:    SOCIAL WORK ASSESSMENT / PLAN CSW talked by phone with patient's daughter, Andrea [name] [phone]) regarding discharge plans. Per daughter they [facility]. She has visited [facility] and was told they have bed availability and this is where she wants patient to go at discharge.    CSW talked with admissions staff person Marcelino DusterMichelle and informed her of patient/family's request that patient not return to their facility at discharge.   Assessment/plan status:  Psychosocial Support/Ongoing Assessment of Needs Other assessment/ plan:   Information/referral to community resources:   None needed or requested at this time.    PATIENT'S/FAMILY'S RESPONSE TO PLAN OF CARE: Daughter does not want patient to return to [facility].

## 2014-09-13 NOTE — Progress Notes (Signed)
Pt ambulated in the hallway with fair toleration.  Pt states, " I feel so weak."  Ambulated from bed out into the hall about 15 ft.  O2 sat was 93% on 1 liter and then dropped down to 88 on RA so kept her on 1 Liter O2.

## 2014-09-13 NOTE — Progress Notes (Signed)
[facility] Progress Note   Andrea [name] ION:[id number] DOB: 1919/10/01 DOA: 09/12/2014 PCP: Thayer HeadingsMACKENZIE,BRIAN, MD  Brief narrative: Andrea [name] is a 80 y.o. female with COPD, HTN, CKD 3, chronic systolic CHF (not on Lasix or ACE I) admitted for acute onset of dyspnea. She was found to have a significantly elevated BP. She admitted to the admitted doctor of having a cough. Her daughter is concerned that the patient may have had a panic attack as she saw her yesterday and the patient appeared to be fine during the day. Dyspnea started suddenly in the evening. She was brought to the ER around midnight. She was given antibiotics, Lasix, neb treatments and a Nitroglycerin patch. She was placed on a BiPAP on arrival to the ER and has now been weaned off to 2 L.  Subjective: No c/o dyspnea. States today that she has not had a cough- daughter was with her yesterday and did not not any cough or dyspnea during the day.   Assessment/Plan: Principal Problem:   Acute respiratory failure- chronic systolic CHF  - suspecting hypertension induced flash pulmonary edema vs pneumonia-she is asymptomatic as far as pneumonia (no fever/ cough) but did have a RLL infiltrate and leukocytosis -still no crackles on exam  -  will d/c antibiotics - follow BP - ECHO in 2006 reveals EF 40-50%- current ECHO reveals EF of 35-45%- CXR is clear and lung exam also clear-  no need for further Lasix at this time- cont Cozaar and Metoprolol  Active Problems:   Essential hypertension - stable at this time- cont Losartan, Metoprolol    Dementia without behavioral disturbance - watch for agitation- Haldol PRN ordered    CKD (chronic kidney disease), stage III - mild Cr elevation with Lasix - follow  Recent L2 fracture - cont TLSO brace - cont PT- will return to rehab when stable  Code Status:  DNR Family Communication: daughter Disposition Plan: return to nursing home in 1-2 days DVT prophylaxis:  Heparin  Consultants: none  Procedures: none  Antibiotics: Anti-infectives   Start     Dose/Rate Route Frequency Ordered Stop   09/13/14 0600  vancomycin (VANCOCIN) 500 mg in sodium chloride 0.9 % 100 mL IVPB     500 mg 100 mL/hr over 60 Minutes Intravenous Every 24 hours 09/12/14 0837     09/12/14 0900  ceFEPIme (MAXIPIME) 1 g in dextrose 5 % 50 mL IVPB     1 g 100 mL/hr over 30 Minutes Intravenous Every 24 hours 09/12/14 0836 09/20/14 0859   09/12/14 0830  ceFEPIme (MAXIPIME) 1 g in dextrose 5 % 50 mL IVPB  Status:  Discontinued     1 g 100 mL/hr over 30 Minutes Intravenous 3 times per day 09/12/14 0816 09/12/14 0836   09/12/14 0200  vancomycin (VANCOCIN) IVPB 1000 mg/200 mL premix     1,000 mg 200 mL/hr over 60 Minutes Intravenous  Once 09/12/14 0151 09/12/14 0411   09/12/14 0200  piperacillin-tazobactam (ZOSYN) IVPB 3.375 g     3.375 g 100 mL/hr over 30 Minutes Intravenous  Once 09/12/14 0151 09/12/14 0411         Objective: Filed Weights   09/12/14 1246  Weight: 46.72 kg (103 lb)    Intake/Output Summary (Last 24 hours) at 09/13/14 1420 Last data filed at 09/13/14 0920  Gross per 24 hour  Intake    340 ml  Output      0 ml  Net    340 ml  Vitals Filed Vitals:   09/12/14 2202 09/13/14 0617 09/13/14 0813 09/13/14 1352  BP: 143/77 156/75    Pulse: 69 68 63   Temp: 97.3 F (36.3 C) 98 F (36.7 C)    TempSrc: Oral     Resp: 16 17 18    Height:      Weight:      SpO2: 99% 93% 98% 98%    Exam: General: No acute respiratory distress- poor historian Lungs: Clear to auscultation bilaterally without wheezes or crackles Cardiovascular: Regular rate and rhythm without murmur gallop or rub normal S1 and S2 Abdomen: Nontender, nondistended, soft, bowel sounds positive, no rebound, no ascites, no appreciable mass Extremities: No significant cyanosis, clubbing, or edema bilateral lower extremities  Data Reviewed: Basic Metabolic Panel:  Recent Labs Lab  09/12/14 0124 09/12/14 0842 09/13/14 0321  NA 137 141 140  K 4.3 4.0 4.1  CL 103 97 99  CO2  --  28 27  GLUCOSE 181* 95 87  BUN 25* 24* 25*  CREATININE 1.30* 1.33* 1.38*  CALCIUM  --  9.7 9.2  MG  --  2.0  --    Liver Function Tests: No results found for this basename: AST, ALT, ALKPHOS, BILITOT, PROT, ALBUMIN,  in the last 168 hours No results found for this basename: LIPASE, AMYLASE,  in the last 168 hours No results found for this basename: AMMONIA,  in the last 168 hours CBC:  Recent Labs Lab 09/12/14 0101 09/12/14 0124 09/12/14 0842 09/13/14 0321  WBC 21.9*  --  13.3* 10.4  NEUTROABS 11.4*  --   --   --   HGB 13.1 14.6 13.1 11.6*  HCT 41.5 43.0 40.8 36.6  MCV 96.5  --  95.3 95.3  PLT 500*  --  359 339   Cardiac Enzymes:  Recent Labs Lab 09/12/14 0842 09/12/14 1532 09/12/14 2145  TROPONINI <0.30 <0.30 <0.30   BNP (last 3 results)  Recent Labs  09/12/14 0102  PROBNP 9765.0*   CBG: No results found for this basename: GLUCAP,  in the last 168 hours  No results found for this or any previous visit (from the past 240 hour(s)).   Studies:  Recent x-ray studies have been reviewed in detail by the Attending Physician  Scheduled Meds:  Scheduled Meds: . albuterol  2.5 mg Nebulization TID  . buPROPion  300 mg Oral QPM  . ceFEPime (MAXIPIME) IV  1 g Intravenous Q24H  . DULoxetine  30 mg Oral Daily  . DULoxetine  60 mg Oral Daily  . escitalopram  20 mg Oral Daily  . feeding supplement (ENSURE COMPLETE)  237 mL Oral BID BM  . heparin  5,000 Units Subcutaneous 3 times per day  . losartan  100 mg Oral Daily  . memantine  10 mg Oral BID  . metoprolol tartrate  12.5 mg Oral BID  . QUEtiapine  25 mg Oral QHS  . sodium chloride  3 mL Intravenous Q12H  . tiotropium  18 mcg Inhalation Daily  . vancomycin  500 mg Intravenous Q24H   Continuous Infusions:   Time spent on care of this patient: 35 min   Mayleen Borrero, MD 09/13/2014, 2:20 PM  LOS: 1 day    [facility] Office  [phone] Pager - Text Page per [email or web address]  If 7PM-7AM, please contact night-coverage [email or web address]

## 2014-09-13 NOTE — Progress Notes (Signed)
Pt sat=93% on room air while lying.  Will check ambulating after lunch.

## 2014-09-13 NOTE — Progress Notes (Signed)
Pt refused to wear TLSO brace all night.

## 2014-09-14 DIAGNOSIS — J810 Acute pulmonary edema: Secondary | ICD-10-CM

## 2014-09-14 HISTORY — DX: Acute pulmonary edema: J81.0

## 2014-09-14 LAB — LEGIONELLA ANTIGEN, URINE

## 2014-09-14 MED ORDER — ALBUTEROL SULFATE (2.5 MG/3ML) 0.083% IN NEBU: 2.5000 mg | INHALATION_SOLUTION | RESPIRATORY_TRACT | Status: DC | PRN

## 2014-09-14 NOTE — Discharge Summary (Addendum)
Physician Discharge Summary  Andrea LazierGeraldine Matin ZOX:[id number] DOB: Jul 17, 1919 DOA: 09/12/2014  PCP: Thayer HeadingsMACKENZIE,BRIAN, MD  Admit date: 09/12/2014 Discharge date: 09/15/2014  Time spent: >45 minutes   Discharge Condition: stable Diet recommendation: heart healthy  Discharge Diagnoses:  Principal Problem:   Acute respiratory failure Active Problems:   Flash pulmonary edema   Essential hypertension   Dementia without behavioral disturbance   CKD (chronic kidney disease), stage III   Acute on chronic systolic CHF   History of present illness:  Andrea [name] is a 80 y.o. female with COPD, HTN, CKD 3, chronic systolic CHF (not on Lasix or ACE I) admitted for acute onset of dyspnea. She was found to have a significantly elevated BP. She admitted to the admitted doctor of having a cough. Her daughter is concerned that the patient may have had a panic attack as she saw her yesterday and the patient appeared to be fine during the day. Dyspnea started suddenly in the evening. She was brought to the ER around midnight. She was given antibiotics, Lasix, neb treatments and a Nitroglycerin patch. She was placed on a BiPAP on arrival to the ER and has now been weaned off to 2 L.   Hospital Course:  Principal Problem:  Acute respiratory failure- chronic systolic CHF  - suspecting hypertension induced flash pulmonary edema - question panic attack - she is asymptomatic as far as pneumonia (no fever/ cough) but did have a RLL infiltrate and leukocytosis - no crackles on exam in ER or subsequently- repeat CXR reveals resolution of infiltrate therefore highly doubtful that she had a pneumonia- likely was atypical pulmonary edema - have d/c antibiotics- 10/24  -  BP has been stable - ECHO in 2006 reveals EF 40-50%- current ECHO reveals EF of 35-45%- CXR is clear and lung exam also clear- no need for further Lasix at this time- cont Cozaar and Metoprolol   Active Problems:  Essential hypertension   - stable at this time- cont Losartan, Metoprolol   Dementia without behavioral disturbance  - patient has episodes of confusion at nursing home and at times gets quite agitated - Haldol PRN can be continued as prior order  CKD (chronic kidney disease), stage III  - mild Cr elevation with Lasix - has been stable  Recent L2 fracture  - cont TLSO brace  - cont PT- will return to rehab when stable   Depression - noted to be on both Cymbalta and Lexapro- this puts her at a very high risk for Serotonin syndrome- I have discontinued Cymbalta   Procedures:  none  Consultations:  none  Discharge Exam: Filed Weights   09/12/14 1246 09/14/14 1008  Weight: 46.72 kg (103 lb) 48.988 kg (108 lb)   Filed Vitals:   09/15/14 0524  BP: 141/76  Pulse: 60  Temp: 97.5 F (36.4 C)  Resp: 16    General: AAO x 3, no distress, confused to place and time Cardiovascular: RRR, no murmurs  Respiratory: clear to auscultation bilaterally GI: soft, non-tender, non-distended, bowel sound positive  Discharge Instructions You were cared for by a hospitalist during your hospital stay. If you have any questions about your discharge medications or the care you received while you were in the hospital after you are discharged, you can call the unit and asked to speak with the hospitalist on call if the hospitalist that took care of you is not available. Once you are discharged, your primary care physician will handle any further medical issues. Please note that NO  REFILLS for any discharge medications will be authorized once you are discharged, as it is imperative that you return to your primary care physician (or establish a relationship with a primary care physician if you do not have one) for your aftercare needs so that they can reassess your need for medications and monitor your lab values.      Discharge Instructions   Diet - low sodium heart healthy    Complete by:  As directed      Increase  activity slowly    Complete by:  As directed             Medication List    STOP taking these medications       DULoxetine 30 MG capsule  Commonly known as:  CYMBALTA     DULoxetine 60 MG capsule  Commonly known as:  CYMBALTA      TAKE these medications       acetaminophen 325 MG tablet  Commonly known as:  TYLENOL  Take 2 tablets (650 mg total) by mouth every 6 (six) hours as needed for mild pain or moderate pain (or Fever >/= 101).     buPROPion 300 MG 24 hr tablet  Commonly known as:  WELLBUTRIN XL  Take 1 tablet (300 mg total) by mouth every evening.     clonazePAM 0.5 MG tablet  Commonly known as:  KLONOPIN  Take 0.5 tablets (0.25 mg total) by mouth 2 (two) times daily as needed for anxiety.     cyclobenzaprine 5 MG tablet  Commonly known as:  FLEXERIL  Take 1 tablet (5 mg total) by mouth 3 (three) times daily as needed for muscle spasms.     escitalopram 20 MG tablet  Commonly known as:  LEXAPRO  Take 1 tablet (20 mg total) by mouth daily.     haloperidol 2 MG tablet  Commonly known as:  HALDOL  Take 2 mg by mouth every 4 (four) hours as needed for agitation.     haloperidol 2 MG/ML solution  Commonly known as:  HALDOL  Take 2 mg by mouth every 4 (four) hours as needed for agitation.     losartan 100 MG tablet  Commonly known as:  COZAAR  Take 1 tablet (100 mg total) by mouth daily.     memantine 10 MG tablet  Commonly known as:  NAMENDA  Take 10 mg by mouth 2 (two) times daily.     metoprolol tartrate 25 MG tablet  Commonly known as:  LOPRESSOR  Take 0.5 tablets (12.5 mg total) by mouth 2 (two) times daily.     PRESCRIPTION MEDICATION  Take 90 mLs by mouth 2 (two) times daily. Med Pass     QUEtiapine 25 MG tablet  Commonly known as:  SEROQUEL  Take 25 mg by mouth at bedtime.     tiotropium 18 MCG inhalation capsule  Commonly known as:  SPIRIVA  Place 18 mcg into inhaler and inhale daily.     traMADol-acetaminophen 37.5-325 MG per tablet   Commonly known as:  ULTRACET  Take 1 tablet by mouth every 8 (eight) hours as needed for moderate pain.       Allergies  Allergen Reactions  . Aspirin     Stomach upset      The results of significant diagnostics from this hospitalization (including imaging, microbiology, ancillary and laboratory) are listed below for reference.    Significant Diagnostic Studies: Dg Chest 2 View  09/13/2014   CLINICAL DATA:  Subsequent  evaluation for infiltrate, personal history of COPD and hypertension  EXAM: CHEST  2 VIEW  COMPARISON:  09/12/2014  FINDINGS: Hyperinflation consistent with COPD. Heart size and vascular pattern are normal. Aorta is calcified. Right lung is clear. Infiltrate in the right lower lobe has resolved. Left lung remains clear except for mild scarring at the base.  IMPRESSION: Resolution of previous right lower lobe infiltrate.   Electronically Signed   By: Esperanza Heir M.D.   On: 09/13/2014 11:02   Dg Chest 2 View  08/25/2014   CLINICAL DATA:  Left chest pain following a fall today.  EXAM: CHEST  2 VIEW  COMPARISON:  06/17/2013.  FINDINGS: The heart remains normal in size. The lungs remain hyperexpanded and clear. Old, healed left rib fractures are again demonstrated. No acute fracture or pneumothorax seen. Diffuse osteopenia is noted as well as mid to lower thoracic spine degenerative changes. Right shoulder degenerative changes.  IMPRESSION: No acute abnormality. Stable changes of COPD. If there is a clinical concern for left rib fracture, left rib radiographs would be recommended.   Electronically Signed   By: Gordan Payment M.D.   On: 08/25/2014 18:42   Dg Lumbar Spine Complete  08/25/2014   CLINICAL DATA:  Low back pain following a fall today.  EXAM: LUMBAR SPINE - COMPLETE 4+ VIEW  COMPARISON:  Abdomen radiographs dated 12/05/2004.  FINDINGS: Five non-rib-bearing lumbar vertebrae. Interval approximately 50% compression deformity of the L2 vertebral body with mild bony  retropulsion and possible acute fracture lines and fragments anteriorly. No additional fractures. No pars defects. Facet degenerative changes in the mid and lower lumbar spine with associated grade 1 anterolisthesis at the L3-4 level. Atheromatous arterial calcifications.  IMPRESSION: 1. Probably acute L2 vertebral body 50% compression fracture. This could be confirmed with a noncontrast a lumbar spine CT if clinically indicated. 2. Diffuse osteopenia. 3. Degenerative changes.   Electronically Signed   By: Gordan Payment M.D.   On: 08/25/2014 18:45   Dg Pelvis 1-2 Views  08/25/2014   CLINICAL DATA:  Left pelvic pain after falling.  Initial encounter.  EXAM: PELVIS - 1-2 VIEW  COMPARISON:  Abdominal radiographs 12/05/2004.  FINDINGS: The bones are diffusely demineralized. There is no evidence of acute fracture or sacroiliac joint diastasis. There are degenerative changes at the sacroiliac joints, both hips and the symphysis pubis. Radiodensities overlapping the pelvis are likely related to retained contrast within colonic diverticula.  IMPRESSION: No evidence of acute pelvic fracture or dislocation. Degenerative changes as described.   Electronically Signed   By: Roxy Horseman M.D.   On: 08/25/2014 18:42   Ct Head Wo Contrast  08/25/2014   CLINICAL DATA:  Fall.  Head injury.  EXAM: CT HEAD WITHOUT CONTRAST  TECHNIQUE: Contiguous axial images were obtained from the base of the skull through the vertex without intravenous contrast.  COMPARISON:  07/09/2013.  FINDINGS: Low attenuation throughout the subcortical and periventricular white matter noted compatible with chronic small vessel ischemic change. There is prominence of the sulci and ventricles compatible with brain atrophy. There is no evidence for acute intracranial hemorrhage, infarct or mass. The paranasal sinuses appear clear. The mastoid air cells are clear. The skull is intact.  IMPRESSION: 1. No acute intracranial abnormalities. 2. Small vessel ischemic  disease and brain atrophy.   Electronically Signed   By: Signa Kell M.D.   On: 08/25/2014 19:06   Ct Lumbar Spine Wo Contrast  08/25/2014   CLINICAL DATA:  Initial encounter for 3  falls during the last 3 days. Initial fall was a slip while trying to hang close at home. Patient landed on her buttocks. The fall was on the same level.  EXAM: CT LUMBAR SPINE WITHOUT CONTRAST  TECHNIQUE: Multidetector CT imaging of the lumbar spine was performed without intravenous contrast administration. Multiplanar CT image reconstructions were also generated.  COMPARISON:  Lumbar spine radiographs from the same day.  FINDINGS: CT confirms an acute superior endplate fracture of L2. This is a burst fracture with posterior displaced fragments 4 mm from the expected wall of the posterior vertebral body is encroaches into the spinal canal without significant stenosis. The fractures extend into the pedicle bilaterally. Remaining vertebral bodies are intact. No additional fractures are present. Alignment is anatomic. Facet degenerative changes are present.  Limited imaging of the abdomen demonstrates significant atrophy of the left kidney. Atherosclerotic calcifications are present in the aorta without aneurysm. Degenerative changes are noted at the SI joints bilaterally.  IMPRESSION: 1. Burst fracture of the superior endplate of L2. Bone fragments are retropulsed 4 mm into the canal without significant stenosis. 2. Fractures extend through the pedicles bilaterally. 3. Degenerative changes through the lumbar spine without additional fracture. 4. Severe atrophy of the left kidney. 5. Atherosclerosis   Electronically Signed   By: Gennette Pachris  Mattern M.D.   On: 08/25/2014 21:14   Dg Chest Portable 1 View  09/12/2014   CLINICAL DATA:  Shortness of breath.  EXAM: PORTABLE CHEST - 1 VIEW  COMPARISON:  08/25/2014  FINDINGS: Heart size and pulmonary vascularity are normal. Emphysematous changes in the lungs with scattered fibrosis. Interval  development of airspace disease in the right lung base suggesting developing focal pneumonia. No blunting of costophrenic angles. No pneumothorax. Multiple old left rib fractures. Calcification of the aorta. Degenerative changes in the spine and shoulders.  IMPRESSION: New focal airspace disease in the right lung base suggesting pneumonia. Underlying emphysematous changes in the lungs.   Electronically Signed   By: Burman NievesWilliam  Stevens M.D.   On: 09/12/2014 01:34    Microbiology: Recent Results (from the past 240 hour(s))  CULTURE, BLOOD (ROUTINE X 2)     Status: None   Collection Time    09/12/14  2:15 AM      Result Value Ref Range Status   Specimen Description BLOOD RIGHT WRIST   Final   Special Requests BOTTLES DRAWN AEROBIC AND ANAEROBIC 5CC EACH   Final   Culture  Setup Time     Final   Value: 09/12/2014 08:58     Performed at [facility]   Culture     Final   Value:        BLOOD CULTURE RECEIVED NO GROWTH TO DATE CULTURE WILL BE HELD FOR 5 DAYS BEFORE ISSUING A FINAL NEGATIVE REPORT     Performed at [facility]   Report Status PENDING   Incomplete  CULTURE, BLOOD (ROUTINE X 2)     Status: None   Collection Time    09/12/14  2:20 AM      Result Value Ref Range Status   Specimen Description BLOOD LEFT HAND   Final   Special Requests BOTTLES DRAWN AEROBIC AND ANAEROBIC [facility] EACH   Final   Culture  Setup Time     Final   Value: 09/12/2014 08:58     Performed at [facility]   Culture     Final   Value:        BLOOD CULTURE RECEIVED NO  GROWTH TO DATE CULTURE WILL BE HELD FOR 5 DAYS BEFORE ISSUING A FINAL NEGATIVE REPORT     Performed at [facility]   Report Status PENDING   Incomplete     Labs: Basic Metabolic Panel:  Recent Labs Lab 09/12/14 0124 09/12/14 0842 09/13/14 0321  NA 137 141 140  K 4.3 4.0 4.1  CL 103 97 99  CO2  --  28 27  GLUCOSE 181* 95 87  BUN 25* 24* 25*  CREATININE 1.30* 1.33* 1.38*  CALCIUM  --  9.7 9.2  MG  --  2.0   --    Liver Function Tests: No results found for this basename: AST, ALT, ALKPHOS, BILITOT, PROT, ALBUMIN,  in the last 168 hours No results found for this basename: LIPASE, AMYLASE,  in the last 168 hours No results found for this basename: AMMONIA,  in the last 168 hours CBC:  Recent Labs Lab 09/12/14 0101 09/12/14 0124 09/12/14 0842 09/13/14 0321  WBC 21.9*  --  13.3* 10.4  NEUTROABS 11.4*  --   --   --   HGB 13.1 14.6 13.1 11.6*  HCT 41.5 43.0 40.8 36.6  MCV 96.5  --  95.3 95.3  PLT 500*  --  359 339   Cardiac Enzymes:  Recent Labs Lab 09/12/14 0842 09/12/14 1532 09/12/14 2145  TROPONINI <0.30 <0.30 <0.30   BNP: BNP (last 3 results)  Recent Labs  09/12/14 0102  PROBNP 9765.0*   CBG: No results found for this basename: GLUCAP,  in the last 168 hours     Signed:  Calvert Cantor, MD [facility] 09/15/2014, 11:26 AM

## 2014-09-14 NOTE — Progress Notes (Signed)
CSW has been unable to reach pt's daughter today re: d/c to [facility].  [facility] and [facility] and [facility] re: pt's preference fpr their facility.

## 2014-09-14 NOTE — Progress Notes (Signed)
Once pt is sitting up in chair, checked O2 sat on RA and it is 96%, pulse 62.

## 2014-09-14 NOTE — Progress Notes (Signed)
CSW has left message re: pt's daughter, [name], re: pt's d/c today. Await return call.

## 2014-09-14 NOTE — Progress Notes (Signed)
Ambulated pt in the hallway with front wheel walker and assistance of 2 on the side as stand by assistance.  Pt ambulated approximately 25 feet after walking to the bathroom.  Checked sats on RA 78-80%.  Assisted pt back to chair to sit up.

## 2014-09-15 NOTE — Progress Notes (Signed)
The patient was not discharged to SNF as planned yesterday as they were not able to reach the daughter. The patient will be discharged today.   Andrea CantorSaima Wilmont Olund, MD

## 2014-09-15 NOTE — Clinical Social Work Placement (Signed)
Clinical Social Work Department CLINICAL SOCIAL WORK PLACEMENT NOTE 09/15/2014  Patient:  Andrea [name],Kiandria  Account Number:  [id number] Admit date:  09/12/2014  Clinical Social Worker:  Mosie EpsteinEMILY S Alwilda Gilland, LCSWA  Date/time:  09/15/2014 01:40 PM  Clinical Social Work is seeking post-discharge placement for this patient at the following level of care:   SKILLED NURSING   (*CSW will update this form in Epic as items are completed)   09/14/2014  Patient/family provided with [facility] Department of Clinical Social Work's list of facilities offering this level of care within the geographic area requested by the patient (or if unable, by the patient's family).  09/14/2014  Patient/family informed of their freedom to choose among providers that offer the needed level of care, that participate in Medicare, Medicaid or managed care program needed by the patient, have an available bed and are willing to accept the patient.  09/14/2014  Patient/family informed of [facility]' ownership interest in [facility], as well as of the fact that they are under no obligation to receive care at this facility.  PASARR submitted to EDS on  PASARR number received on   FL2 transmitted to all facilities in geographic area requested by pt/family on  09/14/2014 FL2 transmitted to all facilities within larger geographic area on   Patient informed that his/her managed care company has contracts with or will negotiate with  certain facilities, including the following:     Patient/family informed of bed offers received:  09/14/2014 [facility] Physician recommends and patient chooses bed at    [facility]  09/15/2014 Patient to be transferred to facility by [facility] Patient and family notified of transfer on 09/15/2014 Name of family member notified:  [name], pt's daughter.  The following physician request were  entered in Epic:   Additional Comments: PASARR previously existing.  Marcelline Deistmily Nataliyah Packham, LCSWA [phone]) Licensed Clinical Social Worker Orthopedics [phone]([id number]) and Surgical [phone]([id number])

## 2014-09-15 NOTE — Clinical Social Work Note (Signed)
[facility]. [name], Misty StanleyLisa, updated regarding discharge.  [facility]: [phone] Transportation: EMS ([facility]) scheduled for 2:30pm.  Marcelline Deistmily Nayanna Seaborn, LCSWA [phone]) Licensed Clinical Social Worker Orthopedics [phone](5N17-32) and Surgical [phone](6N17-32)

## 2014-09-15 NOTE — Progress Notes (Signed)
[facility] today via EMS. VSS. Pain denied. reprot called to Ellsworth LennoxSam Middleton RN nurse supervisor.  Belongings sent with patient in ambulance.  Daughter called and notified of d/c

## 2014-09-15 NOTE — Care Management Note (Signed)
  Page 1 of 1   09/15/2014     2:48:32 PM CARE MANAGEMENT NOTE 09/15/2014  Patient:  Jaci LazierBOUFFARD,Journie   Account Number:  [id number]  Date Initiated:  09/15/2014  Documentation initiated by:  Ronny FlurryWILE,Shivan Hodes  Subjective/Objective Assessment:     Action/Plan:   Anticipated DC Date:  09/15/2014   Anticipated DC Plan:  SKILLED NURSING FACILITY  In-house referral  Clinical Social Worker         Choice offered to / List presented to:             Status of service:   Medicare Important Message given?  YES (If response is "NO", the following Medicare IM given date fields will be blank) Date Medicare IM given:  09/15/2014 Medicare IM given by:  Ronny FlurryWILE,Loanne Emery Date Additional Medicare IM given:   Additional Medicare IM given by:    Discharge Disposition:    Per UR Regulation:    If discussed at Long Length of Stay Meetings, dates discussed:    Comments:

## 2014-09-18 LAB — CULTURE, BLOOD (ROUTINE X 2)
CULTURE: NO GROWTH
Culture: NO GROWTH

## 2014-10-01 ENCOUNTER — Emergency Department (HOSPITAL_COMMUNITY)
Admission: EM | Admit: 2014-10-01 | Discharge: 2014-10-01 | Disposition: A | Discharge status: Home / Self Care | Payer: Medicare Other | Attending: Emergency Medicine | Admitting: Emergency Medicine

## 2014-10-01 ENCOUNTER — Encounter (HOSPITAL_COMMUNITY): Payer: Self-pay | Admitting: Emergency Medicine

## 2014-10-01 ENCOUNTER — Emergency Department (HOSPITAL_COMMUNITY): Payer: Medicare Other

## 2014-10-01 DIAGNOSIS — G309 Alzheimer's disease, unspecified: Secondary | ICD-10-CM | POA: Insufficient documentation

## 2014-10-01 DIAGNOSIS — J441 Chronic obstructive pulmonary disease with (acute) exacerbation: Secondary | ICD-10-CM | POA: Diagnosis not present

## 2014-10-01 DIAGNOSIS — I129 Hypertensive chronic kidney disease with stage 1 through stage 4 chronic kidney disease, or unspecified chronic kidney disease: Secondary | ICD-10-CM | POA: Diagnosis not present

## 2014-10-01 DIAGNOSIS — F329 Major depressive disorder, single episode, unspecified: Secondary | ICD-10-CM | POA: Diagnosis not present

## 2014-10-01 DIAGNOSIS — N189 Chronic kidney disease, unspecified: Secondary | ICD-10-CM | POA: Diagnosis not present

## 2014-10-01 DIAGNOSIS — R531 Weakness: Secondary | ICD-10-CM | POA: Diagnosis not present

## 2014-10-01 DIAGNOSIS — J449 Chronic obstructive pulmonary disease, unspecified: Secondary | ICD-10-CM

## 2014-10-01 DIAGNOSIS — R0602 Shortness of breath: Secondary | ICD-10-CM | POA: Diagnosis present

## 2014-10-01 DIAGNOSIS — Z79899 Other long term (current) drug therapy: Secondary | ICD-10-CM | POA: Diagnosis not present

## 2014-10-01 DIAGNOSIS — F0280 Dementia in other diseases classified elsewhere without behavioral disturbance: Secondary | ICD-10-CM | POA: Insufficient documentation

## 2014-10-01 LAB — CBC WITH DIFFERENTIAL/PLATELET
Basophils Absolute: 0 10*3/uL (ref 0.0–0.1)
Basophils Relative: 0 % (ref 0–1)
EOS ABS: 0.1 10*3/uL (ref 0.0–0.7)
Eosinophils Relative: 1 % (ref 0–5)
HCT: 36.6 % (ref 36.0–46.0)
Hemoglobin: 11.8 g/dL — ABNORMAL LOW (ref 12.0–15.0)
Lymphocytes Relative: 39 % (ref 12–46)
Lymphs Abs: 3 10*3/uL (ref 0.7–4.0)
MCH: 30.1 pg (ref 26.0–34.0)
MCHC: 32.2 g/dL (ref 30.0–36.0)
MCV: 93.4 fL (ref 78.0–100.0)
Monocytes Absolute: 0.6 10*3/uL (ref 0.1–1.0)
Monocytes Relative: 8 % (ref 3–12)
NEUTROS PCT: 52 % (ref 43–77)
Neutro Abs: 4 10*3/uL (ref 1.7–7.7)
PLATELETS: 196 10*3/uL (ref 150–400)
RBC: 3.92 MIL/uL (ref 3.87–5.11)
RDW: 14.7 % (ref 11.5–15.5)
WBC: 7.7 10*3/uL (ref 4.0–10.5)

## 2014-10-01 LAB — COMPREHENSIVE METABOLIC PANEL
ALT: 12 U/L (ref 0–35)
ANION GAP: 13 (ref 5–15)
AST: 15 U/L (ref 0–37)
Albumin: 3.4 g/dL — ABNORMAL LOW (ref 3.5–5.2)
Alkaline Phosphatase: 83 U/L (ref 39–117)
BUN: 27 mg/dL — AB (ref 6–23)
CO2: 23 mEq/L (ref 19–32)
Calcium: 9.4 mg/dL (ref 8.4–10.5)
Chloride: 103 mEq/L (ref 96–112)
Creatinine, Ser: 1.25 mg/dL — ABNORMAL HIGH (ref 0.50–1.10)
GFR calc non Af Amer: 36 mL/min — ABNORMAL LOW (ref >90)
GFR, EST AFRICAN AMERICAN: 42 mL/min — AB (ref >90)
Glucose, Bld: 95 mg/dL (ref 70–99)
POTASSIUM: 3.7 meq/L (ref 3.7–5.3)
SODIUM: 139 meq/L (ref 137–147)
TOTAL PROTEIN: 5.8 g/dL — AB (ref 6.0–8.3)
Total Bilirubin: 0.5 mg/dL (ref 0.3–1.2)

## 2014-10-01 LAB — URINALYSIS, ROUTINE W REFLEX MICROSCOPIC
Bilirubin Urine: NEGATIVE
GLUCOSE, UA: NEGATIVE mg/dL
Hgb urine dipstick: NEGATIVE
Ketones, ur: NEGATIVE mg/dL
Leukocytes, UA: NEGATIVE
Nitrite: NEGATIVE
Protein, ur: NEGATIVE mg/dL
SPECIFIC GRAVITY, URINE: 1.02 (ref 1.005–1.030)
Urobilinogen, UA: 1 mg/dL (ref 0.0–1.0)
pH: 5.5 (ref 5.0–8.0)

## 2014-10-01 LAB — PRO B NATRIURETIC PEPTIDE: PRO B NATRI PEPTIDE: 1346 pg/mL — AB (ref 0–450)

## 2014-10-01 LAB — I-STAT TROPONIN, ED: Troponin i, poc: 0.03 ng/mL (ref 0.00–0.08)

## 2014-10-01 MED ORDER — IPRATROPIUM-ALBUTEROL 0.5-2.5 (3) MG/3ML IN SOLN
3.0000 mL | Freq: Once | RESPIRATORY_TRACT | Status: AC
  Administered 2014-10-01: 3 mL via RESPIRATORY_TRACT
  Filled 2014-10-01: qty 3

## 2014-10-01 MED ORDER — PREDNISONE 20 MG PO TABS: 40.0000 mg | ORAL_TABLET | Freq: Every day | ORAL | Status: DC

## 2014-10-01 MED ORDER — PREDNISONE 20 MG PO TABS
40.0000 mg | ORAL_TABLET | Freq: Once | ORAL | Status: AC
  Administered 2014-10-01: 40 mg via ORAL
  Filled 2014-10-01: qty 2

## 2014-10-01 MED ORDER — ALBUTEROL SULFATE HFA 108 (90 BASE) MCG/ACT IN AERS: 1.0000 | INHALATION_SPRAY | Freq: Four times a day (QID) | RESPIRATORY_TRACT | Status: DC | PRN

## 2014-10-01 NOTE — ED Notes (Signed)
Bed: WA03 Expected date:  Expected time:  Means of arrival:  Comments: Elderly, SOB, clear breath sounds

## 2014-10-01 NOTE — ED Provider Notes (Signed)
CSN: [id number]     Arrival date & time 10/01/14  [id number] History   First MD Initiated Contact with Patient 10/01/14 [phone]     Chief Complaint  Patient presents with  . Shortness of Breath  . Weakness     (Consider location/radiation/quality/duration/timing/severity/associated sxs/prior Treatment) The history is provided by the patient.  Jaci LazierGeraldine Buckel is a 80 y.o. female hx of COPD, dementia, HTN, recent pulmonary edema, here presenting with shortness of breath. History of present to the nursing home and was complaining of shortness of breath this morning. She normally walks around and feeds herself but is too tired to do so. She is complaining of generalized weakness as well. Denies any fevers or chills or cough. Recently admitted and was found to have pulmonary edema and had a EF 35% on echo.    Level V caveat- dementia    Past Medical History  Diagnosis Date  . COPD (chronic obstructive pulmonary disease)   . Alzheimer's dementia   . Depression   . Chronic kidney disease   . Hypertension    History reviewed. No pertinent past surgical history. Family History  Problem Relation Age of Onset  . Suicidality Other    History  Substance Use Topics  . Smoking status: Never Smoker   . Smokeless tobacco: Not on file  . Alcohol Use: No   OB History    No data available     Review of Systems  Respiratory: Positive for shortness of breath.   Neurological: Positive for weakness.  All other systems reviewed and are negative.     Allergies  Aspirin  Home Medications   Prior to Admission medications   Medication Sig Start Date End Date Taking? Authorizing Provider  acetaminophen (TYLENOL) 325 MG tablet Take 2 tablets (650 mg total) by mouth every 6 (six) hours as needed for mild pain or moderate pain (or Fever >/= 101). 08/26/14  Yes Richarda OverlieNayana Abrol, MD  buPROPion (WELLBUTRIN XL) 300 MG 24 hr tablet Take 1 tablet (300 mg total) by mouth every evening. Patient taking  differently: Take 300 mg by mouth daily with breakfast.  08/28/14  Yes Ripudeep Jenna LuoK Rai, MD  clonazePAM (KLONOPIN) 0.5 MG tablet Take 0.5 tablets (0.25 mg total) by mouth 2 (two) times daily as needed for anxiety. 08/28/14  Yes Ripudeep Jenna LuoK Rai, MD  cyclobenzaprine (FLEXERIL) 5 MG tablet Take 1 tablet (5 mg total) by mouth 3 (three) times daily as needed for muscle spasms. 08/26/14  Yes Richarda OverlieNayana Abrol, MD  escitalopram (LEXAPRO) 20 MG tablet Take 1 tablet (20 mg total) by mouth daily. 08/28/14  Yes Ripudeep Jenna LuoK Rai, MD  haloperidol (HALDOL) 2 MG tablet Take 2 mg by mouth every 4 (four) hours as needed for agitation.   Yes Historical Provider, MD  haloperidol (HALDOL) 2 MG/ML solution Take 2 mg by mouth every 4 (four) hours as needed for agitation.   Yes Historical Provider, MD  losartan (COZAAR) 100 MG tablet Take 1 tablet (100 mg total) by mouth daily. 08/28/14  Yes Ripudeep Jenna LuoK Rai, MD  memantine (NAMENDA) 10 MG tablet Take 10 mg by mouth 2 (two) times daily.   Yes Historical Provider, MD  metoprolol tartrate (LOPRESSOR) 25 MG tablet Take 0.5 tablets (12.5 mg total) by mouth 2 (two) times daily. Patient taking differently: Take 25 mg by mouth 2 (two) times daily.  08/28/14  Yes Ripudeep Jenna LuoK Rai, MD  QUEtiapine (SEROQUEL) 25 MG tablet Take 25 mg by mouth at bedtime.   Yes Historical  Provider, MD  tiotropium (SPIRIVA) 18 MCG inhalation capsule Place 18 mcg into inhaler and inhale daily.   Yes Historical Provider, MD  traMADol-acetaminophen (ULTRACET) 37.5-325 MG per tablet Take 1 tablet by mouth every 8 (eight) hours as needed for moderate pain. 08/26/14  Yes Richarda Overlie, MD  PRESCRIPTION MEDICATION Take 90 mLs by mouth 2 (two) times daily. Med Pass    Historical Provider, MD   BP 173/72 mmHg  Pulse 54  Temp(Src) 97.7 F (36.5 C) (Oral)  Resp 20  SpO2 94% Physical Exam  Constitutional:  Chronically ill, slightly tachypneic   HENT:  Head: Normocephalic.  Mouth/Throat: Oropharynx is clear and moist.  Eyes:  Conjunctivae and EOM are normal. Pupils are equal, round, and reactive to light.  Neck: Normal range of motion. Neck supple.  Cardiovascular: Normal rate, regular rhythm and normal heart sounds.   Pulmonary/Chest:  Slightly tachypneic, diminished bilateral bases, no obvious wheezing   Abdominal: Soft. Bowel sounds are normal. She exhibits no distension. There is no tenderness. There is no rebound.  Musculoskeletal: Normal range of motion.  1+ edema bilaterally   Neurological: She is alert.  Demented, moving all extremities   Skin: Skin is warm and dry.  Psychiatric: She has a normal mood and affect. Her behavior is normal. Judgment and thought content normal.  Nursing note and vitals reviewed.   ED Course  Procedures (including critical care time) Labs Review Labs Reviewed  CBC WITH DIFFERENTIAL - Abnormal; Notable for the following:    Hemoglobin 11.8 (*)    All other components within normal limits  COMPREHENSIVE METABOLIC PANEL - Abnormal; Notable for the following:    BUN 27 (*)    Creatinine, Ser 1.25 (*)    Total Protein 5.8 (*)    Albumin 3.4 (*)    GFR calc non Af Amer 36 (*)    GFR calc Af Amer 42 (*)    All other components within normal limits  PRO B NATRIURETIC PEPTIDE - Abnormal; Notable for the following:    Pro B Natriuretic peptide (BNP) 1346.0 (*)    All other components within normal limits  URINE CULTURE  URINALYSIS, ROUTINE W REFLEX MICROSCOPIC  I-STAT TROPOININ, ED    Imaging Review Dg Chest 2 View  10/01/2014   CLINICAL DATA:  Shortness of breath and low O2 sats.  EXAM: CHEST  2 VIEW  COMPARISON:  09/13/2014  FINDINGS: Normal heart size. No pleural effusion or edema. The lungs are hyperinflated but clear. No airspace consolidation. Chronic deformity involving the left posterior and lateral hemi thorax with multiple chronic rib deformities again noted.  IMPRESSION: 1. No acute cardiopulmonary abnormalities. 2. Hyperinflation in changes suggestive of  emphysema.   Electronically Signed   By: Signa Kell M.D.   On: 10/01/2014 09:39     EKG Interpretation   Date/Time:  Wednesday October 01 2014 09:04:01 EST Ventricular Rate:  54 PR Interval:  204 QRS Duration: 78 QT Interval:  497 QTC Calculation: 471 R Axis:   47 Text Interpretation:  Sinus rhythm Anteroseptal infarct, age indeterminate  No significant change since last tracing Confirmed by Fredrica Capano  MD, Leno Mathes  ([id number]) on 10/01/2014 9:10:41 AM      MDM   Final diagnoses:  Shortness of breath   Airica Schwartzkopf is a 80 y.o. female here with shortness of breath, weakness. Consider CHF vs pneumonia vs UTI. Will get labs, BNP, trop, CXR, UA.  11:15 AM Cr. Slightly improved. BNP 1300, improved from 9000. Doesn't appear  volume over loaded. CXR showed COPD. Never hypoxic. Minimal wheezing now. I think likely mild COPD exacerbation. Will give short course of steroids and albuterol. Breathing comfortably. I don't think she meets admission criteria right now. I think she is stable to d/c back to nursing home.     Richardean Canalavid H Lamarius Dirr, MD 10/01/14 [phone]

## 2014-10-01 NOTE — ED Notes (Signed)
Per phone call from CT pt's creatinine is elevated and will not be able to receive contrast.  Dr Silverio LayYao made aware.

## 2014-10-01 NOTE — ED Notes (Signed)
PER EMS- pt picked up from [facility] c/o SOB, however pt states that she is week. Pt arrived to ED alert and oriented per baseline, 94% on 2L.  Lung sounds clear, non- productive cough noted. Pt has hx of dementia. Pt is DNR.

## 2014-10-01 NOTE — Discharge Instructions (Signed)
Take prednisone 40 mg daily x 4 days. Start tomorrow.   Use albuterol every 6 hrs as needed for shortness of breath.   Follow up with your doctor.   Return to ER if you have trouble breathing, chest pain, worse shortness of breath.

## 2014-10-01 NOTE — ED Notes (Signed)
Phone call complete with Daughter, Vashti HeyLisa Reid at [phone]. Made aware that pt is being discharged.

## 2014-10-02 LAB — URINE CULTURE
Colony Count: NO GROWTH
Culture: NO GROWTH

## 2014-10-08 ENCOUNTER — Encounter (HOSPITAL_COMMUNITY): Payer: Self-pay | Admitting: Emergency Medicine

## 2014-10-08 ENCOUNTER — Emergency Department (HOSPITAL_COMMUNITY)
Admission: EM | Admit: 2014-10-08 | Discharge: 2014-10-08 | Disposition: A | Discharge status: Home / Self Care | Payer: Medicare Other | Attending: Emergency Medicine | Admitting: Emergency Medicine

## 2014-10-08 DIAGNOSIS — Z7952 Long term (current) use of systemic steroids: Secondary | ICD-10-CM | POA: Insufficient documentation

## 2014-10-08 DIAGNOSIS — Z79899 Other long term (current) drug therapy: Secondary | ICD-10-CM | POA: Diagnosis not present

## 2014-10-08 DIAGNOSIS — N189 Chronic kidney disease, unspecified: Secondary | ICD-10-CM | POA: Diagnosis not present

## 2014-10-08 DIAGNOSIS — J449 Chronic obstructive pulmonary disease, unspecified: Secondary | ICD-10-CM | POA: Insufficient documentation

## 2014-10-08 DIAGNOSIS — F329 Major depressive disorder, single episode, unspecified: Secondary | ICD-10-CM | POA: Diagnosis not present

## 2014-10-08 DIAGNOSIS — I129 Hypertensive chronic kidney disease with stage 1 through stage 4 chronic kidney disease, or unspecified chronic kidney disease: Secondary | ICD-10-CM | POA: Insufficient documentation

## 2014-10-08 DIAGNOSIS — R4182 Altered mental status, unspecified: Secondary | ICD-10-CM | POA: Diagnosis present

## 2014-10-08 DIAGNOSIS — F0390 Unspecified dementia without behavioral disturbance: Secondary | ICD-10-CM | POA: Diagnosis not present

## 2014-10-08 LAB — URINALYSIS, ROUTINE W REFLEX MICROSCOPIC
Bilirubin Urine: NEGATIVE
GLUCOSE, UA: NEGATIVE mg/dL
Hgb urine dipstick: NEGATIVE
KETONES UR: NEGATIVE mg/dL
LEUKOCYTES UA: NEGATIVE
NITRITE: NEGATIVE
Protein, ur: NEGATIVE mg/dL
SPECIFIC GRAVITY, URINE: 1.021 (ref 1.005–1.030)
Urobilinogen, UA: 1 mg/dL (ref 0.0–1.0)
pH: 6 (ref 5.0–8.0)

## 2014-10-08 LAB — I-STAT CHEM 8, ED
BUN: 29 mg/dL — AB (ref 6–23)
CALCIUM ION: 1.19 mmol/L (ref 1.13–1.30)
CHLORIDE: 104 meq/L (ref 96–112)
Creatinine, Ser: 1.3 mg/dL — ABNORMAL HIGH (ref 0.50–1.10)
Glucose, Bld: 112 mg/dL — ABNORMAL HIGH (ref 70–99)
HEMATOCRIT: 37 % (ref 36.0–46.0)
Hemoglobin: 12.6 g/dL (ref 12.0–15.0)
POTASSIUM: 3.8 meq/L (ref 3.7–5.3)
SODIUM: 142 meq/L (ref 137–147)
TCO2: 27 mmol/L (ref 0–100)

## 2014-10-08 LAB — I-STAT TROPONIN, ED: TROPONIN I, POC: 0.02 ng/mL (ref 0.00–0.08)

## 2014-10-08 NOTE — ED Notes (Signed)
Bed: WA17 Expected date:  Expected time:  Means of arrival:  Comments: EMS/wound issue

## 2014-10-08 NOTE — ED Notes (Signed)
[facility] here to transport [facility].

## 2014-10-08 NOTE — ED Notes (Signed)
Per EMS [facility]. Pt awoke with sternal rub. When awakening pt request to be seen at hospital. Pt states "I am sick." When asked symptoms or complaint pt states "I can not tell you or they will put me away." Pt hx of dementia and oriented to self at baseline.

## 2014-10-08 NOTE — ED Provider Notes (Signed)
CSN: [id number]     Arrival date & time 10/08/14  1428 History   First MD Initiated Contact with Patient 10/08/14 1453     Chief Complaint  Patient presents with  . Altered Mental Status   Level V caveat for dementia  (Consider location/radiation/quality/duration/timing/severity/associated sxs/prior Treatment) HPI  Patient presents from her nursing facility. Evidently today they had to awaken the patient with a sternal rub. When she awakened patient stated she did not feel well. When I asked the patient how she's feeling she states "not so good". When I ask her what strong she states "I have paralysis". She states it started 24 hours ago. However patient is able to move all extremities without difficulty. Patient denies nausea, pain anywhere, shortness of breath.  PCP Dr Dr Clovis Cao  DO NOT RESUSCITATE signed by Dr. Johnella Moloney  Past Medical History  Diagnosis Date  . COPD (chronic obstructive pulmonary disease)   . Alzheimer's dementia   . Depression   . Chronic kidney disease   . Hypertension    History reviewed. No pertinent past surgical history. Family History  Problem Relation Age of Onset  . Suicidality Other    History  Substance Use Topics  . Smoking status: Never Smoker   . Smokeless tobacco: Not on file  . Alcohol Use: No   Lives in NH   OB History    No data available     Review of Systems  Unable to perform ROS: Dementia      Allergies  Aspirin  Home Medications   Prior to Admission medications   Medication Sig Start Date End Date Taking? Authorizing Provider  acetaminophen (TYLENOL) 325 MG tablet Take 2 tablets (650 mg total) by mouth every 6 (six) hours as needed for mild pain or moderate pain (or Fever >/= 101). 08/26/14  Yes Richarda Overlie, MD  albuterol (PROVENTIL HFA;VENTOLIN HFA) 108 (90 BASE) MCG/ACT inhaler Inhale 1-2 puffs into the lungs every 6 (six) hours as needed for wheezing or shortness of breath. 10/01/14  Yes Richardean Canal,  MD  buPROPion (WELLBUTRIN XL) 300 MG 24 hr tablet Take 1 tablet (300 mg total) by mouth every evening. Patient taking differently: Take 300 mg by mouth daily with breakfast.  08/28/14  Yes Ripudeep Jenna Luo, MD  clonazePAM (KLONOPIN) 0.5 MG tablet Take 0.5 tablets (0.25 mg total) by mouth 2 (two) times daily as needed for anxiety. 08/28/14  Yes Ripudeep Jenna Luo, MD  cyclobenzaprine (FLEXERIL) 5 MG tablet Take 1 tablet (5 mg total) by mouth 3 (three) times daily as needed for muscle spasms. Patient taking differently: Take 5 mg by mouth 3 (three) times daily.  08/26/14  Yes Richarda Overlie, MD  escitalopram (LEXAPRO) 20 MG tablet Take 1 tablet (20 mg total) by mouth daily. 08/28/14  Yes Ripudeep Jenna Luo, MD  haloperidol (HALDOL) 2 MG/ML solution Take 2 mg by mouth every 4 (four) hours as needed for agitation.   Yes Historical Provider, MD  loperamide (IMODIUM) 2 MG capsule Take 2 mg by mouth as needed for diarrhea or loose stools.   Yes Historical Provider, MD  losartan (COZAAR) 100 MG tablet Take 1 tablet (100 mg total) by mouth daily. 08/28/14  Yes Ripudeep Jenna Luo, MD  memantine (NAMENDA) 10 MG tablet Take 10 mg by mouth 2 (two) times daily.   Yes Historical Provider, MD  metoprolol tartrate (LOPRESSOR) 25 MG tablet Take 0.5 tablets (12.5 mg total) by mouth 2 (two) times daily. Patient taking differently:  Take 25 mg by mouth 2 (two) times daily.  08/28/14  Yes Ripudeep Jenna LuoK Rai, MD  PRESCRIPTION MEDICATION Take 90 mLs by mouth 2 (two) times daily. Med Pass   Yes Historical Provider, MD  QUEtiapine (SEROQUEL) 25 MG tablet Take 25 mg by mouth at bedtime.   Yes Historical Provider, MD  tiotropium (SPIRIVA) 18 MCG inhalation capsule Place 18 mcg into inhaler and inhale daily.   Yes Historical Provider, MD  traMADol-acetaminophen (ULTRACET) 37.5-325 MG per tablet Take 1 tablet by mouth every 8 (eight) hours as needed for moderate pain. 08/26/14  Yes Richarda OverlieNayana Abrol, MD  haloperidol (HALDOL) 2 MG tablet Take 2 mg by mouth every  4 (four) hours as needed for agitation.    Historical Provider, MD  predniSONE (DELTASONE) 20 MG tablet Take 2 tablets (40 mg total) by mouth daily. 10/01/14   Richardean Canalavid H Yao, MD   BP 173/81 mmHg  Pulse 56  Temp(Src) 97.5 F (36.4 C) (Oral)  Resp 16  SpO2 94%  Vital signs normal except hypertension  Physical Exam  Constitutional:  Non-toxic appearance. She does not appear ill. No distress.  Frail elderly female who is easily awakened  HENT:  Head: Normocephalic and atraumatic.  Right Ear: External ear normal.  Left Ear: External ear normal.  Nose: Nose normal. No mucosal edema or rhinorrhea.  Mouth/Throat: Oropharynx is clear and moist and mucous membranes are normal. No dental abscesses or uvula swelling.  Eyes: Conjunctivae and EOM are normal. Pupils are equal, round, and reactive to light.  Neck: Normal range of motion and full passive range of motion without pain. Neck supple.  Cardiovascular: Normal rate, regular rhythm and normal heart sounds.  Exam reveals no gallop and no friction rub.   No murmur heard. Pulmonary/Chest: Effort normal and breath sounds normal. No respiratory distress. She has no wheezes. She has no rhonchi. She has no rales. She exhibits no tenderness and no crepitus.  Abdominal: Soft. Normal appearance and bowel sounds are normal. She exhibits no distension. There is no tenderness. There is no rebound and no guarding.  Musculoskeletal: Normal range of motion. She exhibits no edema or tenderness.  Moves all extremities well.   Neurological: She is alert. She has normal strength. No cranial nerve deficit.  Oriented to person and place.  Skin: Skin is warm, dry and intact. No rash noted. No erythema. No pallor.  Psychiatric: She has a normal mood and affect. Her speech is normal and behavior is normal. Her mood appears not anxious.  Nursing note and vitals reviewed.   ED Course  Procedures (including critical care time)  Patient had been seen in the ED on  November 11 and had extensive evaluation. Her urine was repeated today and a i-STAT 8. Patient had no acute problems while in the ED. She had no complaints. She did not know why she was in the ED. Patient will be discharged back to her facility.   Labs Review  Results for orders placed or performed during the hospital encounter of 10/08/14  Urinalysis, Routine w reflex microscopic  Result Value Ref Range   Color, Urine YELLOW YELLOW   APPearance CLOUDY (A) CLEAR   Specific Gravity, Urine 1.021 1.005 - 1.030   pH 6.0 5.0 - 8.0   Glucose, UA NEGATIVE NEGATIVE mg/dL   Hgb urine dipstick NEGATIVE NEGATIVE   Bilirubin Urine NEGATIVE NEGATIVE   Ketones, ur NEGATIVE NEGATIVE mg/dL   Protein, ur NEGATIVE NEGATIVE mg/dL   Urobilinogen, UA 1.0 0.0 -  1.0 mg/dL   Nitrite NEGATIVE NEGATIVE   Leukocytes, UA NEGATIVE NEGATIVE  I-stat Chem 8, ED  Result Value Ref Range   Sodium 142 137 - 147 mEq/L   Potassium 3.8 3.7 - 5.3 mEq/L   Chloride 104 96 - 112 mEq/L   BUN 29 (H) 6 - 23 mg/dL   Creatinine, Ser 0.451.30 (H) 0.50 - 1.10 mg/dL   Glucose, Bld 409112 (H) 70 - 99 mg/dL   Calcium, Ion 8.111.19 9.141.13 - 1.30 mmol/L   TCO2 27 0 - 100 mmol/L   Hemoglobin 12.6 12.0 - 15.0 g/dL   HCT 78.237.0 95.636.0 - 21.346.0 %  I-stat troponin, ED  Result Value Ref Range   Troponin i, poc 0.02 0.00 - 0.08 ng/mL   Comment 3             Laboratory interpretation all normal except stable renal insuffic   Results for orders placed or performed during the hospital encounter of 10/01/14  Urine culture  Result Value Ref Range   Specimen Description URINE, CATHETERIZED    Special Requests NONE    Culture  Setup Time      10/01/2014 15:10 Performed at [facility]    Colony Count NO GROWTH Performed at [facility]     Culture NO GROWTH Performed at [facility]     Report Status 10/02/2014 FINAL   CBC with Differential  Result Value Ref Range   WBC 7.7 4.0 - 10.5 K/uL   RBC 3.92 3.87 - 5.11 MIL/uL    Hemoglobin 11.8 (L) 12.0 - 15.0 g/dL   HCT 08.636.6 57.836.0 - 46.946.0 %   MCV 93.4 78.0 - 100.0 fL   MCH 30.1 26.0 - 34.0 pg   MCHC 32.2 30.0 - 36.0 g/dL   RDW 62.914.7 52.811.5 - 41.315.5 %   Platelets 196 150 - 400 K/uL   Neutrophils Relative % 52 43 - 77 %   Neutro Abs 4.0 1.7 - 7.7 K/uL   Lymphocytes Relative 39 12 - 46 %   Lymphs Abs 3.0 0.7 - 4.0 K/uL   Monocytes Relative 8 3 - 12 %   Monocytes Absolute 0.6 0.1 - 1.0 K/uL   Eosinophils Relative 1 0 - 5 %   Eosinophils Absolute 0.1 0.0 - 0.7 K/uL   Basophils Relative 0 0 - 1 %   Basophils Absolute 0.0 0.0 - 0.1 K/uL  Comprehensive metabolic panel  Result Value Ref Range   Sodium 139 137 - 147 mEq/L   Potassium 3.7 3.7 - 5.3 mEq/L   Chloride 103 96 - 112 mEq/L   CO2 23 19 - 32 mEq/L   Glucose, Bld 95 70 - 99 mg/dL   BUN 27 (H) 6 - 23 mg/dL   Creatinine, Ser 2.441.25 (H) 0.50 - 1.10 mg/dL   Calcium 9.4 8.4 - 01.010.5 mg/dL   Total Protein 5.8 (L) 6.0 - 8.3 g/dL   Albumin 3.4 (L) 3.5 - 5.2 g/dL   AST 15 0 - 37 U/L   ALT 12 0 - 35 U/L   Alkaline Phosphatase 83 39 - 117 U/L   Total Bilirubin 0.5 0.3 - 1.2 mg/dL   GFR calc non Af Amer 36 (L) >90 mL/min   GFR calc Af Amer 42 (L) >90 mL/min   Anion gap 13 5 - 15  Urinalysis, Routine w reflex microscopic  Result Value Ref Range   Color, Urine YELLOW YELLOW   APPearance CLEAR CLEAR   Specific Gravity, Urine 1.020 1.005 -  1.030   pH 5.5 5.0 - 8.0   Glucose, UA NEGATIVE NEGATIVE mg/dL   Hgb urine dipstick NEGATIVE NEGATIVE   Bilirubin Urine NEGATIVE NEGATIVE   Ketones, ur NEGATIVE NEGATIVE mg/dL   Protein, ur NEGATIVE NEGATIVE mg/dL   Urobilinogen, UA 1.0 0.0 - 1.0 mg/dL   Nitrite NEGATIVE NEGATIVE   Leukocytes, UA NEGATIVE NEGATIVE  Pro b natriuretic peptide (BNP)  Result Value Ref Range   Pro B Natriuretic peptide (BNP) 1346.0 (H) 0 - 450 pg/mL  I-stat troponin, ED  Result Value Ref Range   Troponin i, poc 0.03 0.00 - 0.08 ng/mL   Comment 3           Dg Chest 2 View  10/01/2014   CLINICAL  DATA:  Shortness of breath and low O2 sats. IMPRESSION: 1. No acute cardiopulmonary abnormalities. 2. Hyperinflation in changes suggestive of emphysema.   Electronically Signed   By: Signa Kell M.D.   On: 10/01/2014 09:39   Dg Chest 2 View  09/13/2014   CLINICAL DATA:  Subsequent evaluation for infiltrate, personal history of COPD and hypertension  .  IMPRESSION: Resolution of previous right lower lobe infiltrate.   Electronically Signed   By: Esperanza Heir M.D.   On: 09/13/2014 11:02   Dg Chest Portable 1 View  09/12/2014   CLINICAL DATA:  Shortness of breath.    IMPRESSION: New focal airspace disease in the right lung base suggesting pneumonia. Underlying emphysematous changes in the lungs.   Electronically Signed   By: Burman Nieves M.D.   On: 09/12/2014 01:34      Imaging Review No results found.   EKG Interpretation   Date/Time:  Wednesday October 08 2014 14:33:19 EST Ventricular Rate:  57 PR Interval:  201 QRS Duration: 85 QT Interval:  465 QTC Calculation: 453 R Axis:   34 Text Interpretation:  Sinus rhythm Probable left atrial enlargement  Anterior infarct, old No significant change since last tracing 01 Oct 2014  Confirmed by [facility]  MD-I, Shirline Kendle ([id number]) on 10/08/2014 3:33:58 PM      MDM   Final diagnoses:  Dementia, without behavioral disturbance     Plan discharge  Devoria Albe, MD, Franz Dell, MD 10/09/14 [phone]

## 2014-10-08 NOTE — Discharge Instructions (Signed)
Her basic tests today do not show any acute changes. Her urine did not show an infection. She has been in no distress in the ED and has been acting appropriately.

## 2014-11-21 NOTE — Telephone Encounter (Signed)
Refill had been denied Oct 2014. Cleaning out MD in basket. 

## 2015-01-22 ENCOUNTER — Telehealth: Payer: Self-pay | Admitting: Internal Medicine

## 2015-01-22 NOTE — Telephone Encounter (Signed)
pts daughter called asking if we could take patient onto our [facility]. Patient's current pcp will be retiring soon and she needs someone that specializes in elderly patients. If we cannot, patients daughter wants to know if we can recommend someone that could see her.

## 2015-01-22 NOTE — Telephone Encounter (Signed)
Spoke to Dr. Perley JainMcdiarmid, he is willing to take patient on but wanted pts daughter to be aware that he may discontinue several medications. Daughter says the med list has not been updated and she isn't taking several of these medications, seroquel, haldol, and flexeril are a few that she is definitely not taking anymore, daughter is fine with Dr. Perley JainMcdiarmid managing her medications and giving new recommendations. Mailing out new patient paperwork today and will schedule a new Geri appt once we get the paperwork back.

## 2015-01-22 NOTE — Telephone Encounter (Signed)
Will forward to Dr. McDiarmid. Jazmin Hartsell,CMA  

## 2015-02-26 ENCOUNTER — Encounter: Payer: Self-pay | Admitting: Family Medicine

## 2015-02-26 ENCOUNTER — Ambulatory Visit (INDEPENDENT_AMBULATORY_CARE_PROVIDER_SITE_OTHER): Payer: Medicare Other | Admitting: Family Medicine

## 2015-02-26 VITALS — BP 120/81 | HR 65 | Temp 97.5°F | Wt 85.4 lb

## 2015-02-26 DIAGNOSIS — R350 Frequency of micturition: Secondary | ICD-10-CM | POA: Diagnosis present

## 2015-02-26 DIAGNOSIS — R32 Unspecified urinary incontinence: Secondary | ICD-10-CM | POA: Insufficient documentation

## 2015-02-26 LAB — BASIC METABOLIC PANEL
BUN: 26 mg/dL — AB (ref 6–23)
CHLORIDE: 99 meq/L (ref 96–112)
CO2: 20 mEq/L (ref 19–32)
Calcium: 9.8 mg/dL (ref 8.4–10.5)
Creat: 1.4 mg/dL — ABNORMAL HIGH (ref 0.50–1.10)
Glucose, Bld: 91 mg/dL (ref 70–99)
POTASSIUM: 4.7 meq/L (ref 3.5–5.3)
Sodium: 135 mEq/L (ref 135–145)

## 2015-02-26 NOTE — Assessment & Plan Note (Addendum)
Pertinent S&O  Nocturnal urinary frequency ( hourly )  No dysuria, hematuria, fevers or chills  Daytime urination every 3-4 hours Assessment  Suspect this is most likely benign etiology complicated by dementia, possible constipation Plan  Assess daughter to keep voiding diary and fluid intake 1-2 weeks.  Patient already has follow-up appointment in geriatric clinic  Attempted to get urinary analysis and urine culture today, but patient unable to void

## 2015-02-26 NOTE — Progress Notes (Signed)
  Patient name: Andrea LazierGeraldine [name] MRN [id number]  Date of birth: 18-May-1919  CC & HPI:  Andrea [name] is a 81 y.o. female presenting today for urinary frequency.  Brought in by daughter today for paternal urinary frequency.  Reports that she gets up hourly to go the restroom at night for the last few weeks.  This makes it extremely difficult for her home health aides to assist her at night and continued to function during the day.  She is concerned that she will lose her aids and have difficult time caring for her mother.  Reports that daytime urination is less frequent, occurring every 3-4 hours.  There is no dysuria or hematuria, no fevers or chills, no abdominal pain.   ROS: See HPI   Medications : Reviewed   Objective Findings:  Vitals: BP 120/81 mmHg  Pulse 65  Temp(Src) 97.5 F (36.4 C)  Wt 85 lb 6.4 oz (38.737 kg)  Gen: NAD CV: RRR w/o m/r/g, pulses +2 b/l Resp: CTAB w/ normal respiratory effort GI: BS +; no palpable stool burden; no suprapubic mass or tenderness  Assessment & Plan:   Please See Problem Focused Assessment & Plan

## 2015-02-26 NOTE — Patient Instructions (Signed)
It was great seeing you today.   I have order some labs today to check on your urinary frequency. I will send you a letter with the results, or call you if we need to make any changes to your current therapies.  Keep a diary of when you urinate and what liquids you drink throughout the day. Call the clinic in 1-2 weeks to give us this information   Please bring all your medications to every doctors visit   If you have any questions or concerns before then, please call the clinic at [phone].  Take Care,   Dr Wenda LowJames Ketrina Boateng

## 2015-03-03 ENCOUNTER — Inpatient Hospital Stay (HOSPITAL_COMMUNITY)
Admission: EM | Admit: 2015-03-03 | Discharge: 2015-03-07 | DRG: 480 | Disposition: A | Discharge status: Skilled Nursing Facility | Payer: Medicare Other | Attending: Internal Medicine | Admitting: Internal Medicine

## 2015-03-03 ENCOUNTER — Encounter (HOSPITAL_COMMUNITY): Payer: Self-pay | Admitting: *Deleted

## 2015-03-03 ENCOUNTER — Emergency Department (HOSPITAL_COMMUNITY): Payer: Medicare Other

## 2015-03-03 DIAGNOSIS — E43 Unspecified severe protein-calorie malnutrition: Secondary | ICD-10-CM | POA: Diagnosis present

## 2015-03-03 DIAGNOSIS — F419 Anxiety disorder, unspecified: Secondary | ICD-10-CM | POA: Diagnosis present

## 2015-03-03 DIAGNOSIS — W19XXXA Unspecified fall, initial encounter: Secondary | ICD-10-CM | POA: Diagnosis present

## 2015-03-03 DIAGNOSIS — M79609 Pain in unspecified limb: Secondary | ICD-10-CM | POA: Diagnosis not present

## 2015-03-03 DIAGNOSIS — F0390 Unspecified dementia without behavioral disturbance: Secondary | ICD-10-CM | POA: Diagnosis not present

## 2015-03-03 DIAGNOSIS — J449 Chronic obstructive pulmonary disease, unspecified: Secondary | ICD-10-CM | POA: Diagnosis present

## 2015-03-03 DIAGNOSIS — R0602 Shortness of breath: Secondary | ICD-10-CM

## 2015-03-03 DIAGNOSIS — S72141A Displaced intertrochanteric fracture of right femur, initial encounter for closed fracture: Principal | ICD-10-CM | POA: Diagnosis present

## 2015-03-03 DIAGNOSIS — S72001A Fracture of unspecified part of neck of right femur, initial encounter for closed fracture: Secondary | ICD-10-CM

## 2015-03-03 DIAGNOSIS — I10 Essential (primary) hypertension: Secondary | ICD-10-CM

## 2015-03-03 DIAGNOSIS — G92 Toxic encephalopathy: Secondary | ICD-10-CM | POA: Diagnosis present

## 2015-03-03 DIAGNOSIS — E860 Dehydration: Secondary | ICD-10-CM

## 2015-03-03 DIAGNOSIS — I129 Hypertensive chronic kidney disease with stage 1 through stage 4 chronic kidney disease, or unspecified chronic kidney disease: Secondary | ICD-10-CM | POA: Diagnosis present

## 2015-03-03 DIAGNOSIS — N390 Urinary tract infection, site not specified: Secondary | ICD-10-CM | POA: Diagnosis present

## 2015-03-03 DIAGNOSIS — R0902 Hypoxemia: Secondary | ICD-10-CM | POA: Diagnosis not present

## 2015-03-03 DIAGNOSIS — Z79899 Other long term (current) drug therapy: Secondary | ICD-10-CM | POA: Diagnosis not present

## 2015-03-03 DIAGNOSIS — Z9181 History of falling: Secondary | ICD-10-CM | POA: Diagnosis not present

## 2015-03-03 DIAGNOSIS — F0280 Dementia in other diseases classified elsewhere without behavioral disturbance: Secondary | ICD-10-CM | POA: Diagnosis present

## 2015-03-03 DIAGNOSIS — G309 Alzheimer's disease, unspecified: Secondary | ICD-10-CM | POA: Diagnosis present

## 2015-03-03 DIAGNOSIS — Z66 Do not resuscitate: Secondary | ICD-10-CM | POA: Diagnosis present

## 2015-03-03 DIAGNOSIS — R52 Pain, unspecified: Secondary | ICD-10-CM

## 2015-03-03 DIAGNOSIS — M25551 Pain in right hip: Secondary | ICD-10-CM | POA: Diagnosis present

## 2015-03-03 DIAGNOSIS — N183 Chronic kidney disease, stage 3 unspecified: Secondary | ICD-10-CM | POA: Diagnosis present

## 2015-03-03 DIAGNOSIS — Z681 Body mass index (BMI) 19 or less, adult: Secondary | ICD-10-CM

## 2015-03-03 DIAGNOSIS — Z419 Encounter for procedure for purposes other than remedying health state, unspecified: Secondary | ICD-10-CM

## 2015-03-03 DIAGNOSIS — F329 Major depressive disorder, single episode, unspecified: Secondary | ICD-10-CM | POA: Diagnosis present

## 2015-03-03 DIAGNOSIS — S72111A Displaced fracture of greater trochanter of right femur, initial encounter for closed fracture: Secondary | ICD-10-CM

## 2015-03-03 DIAGNOSIS — S72009A Fracture of unspecified part of neck of unspecified femur, initial encounter for closed fracture: Secondary | ICD-10-CM

## 2015-03-03 DIAGNOSIS — F0391 Unspecified dementia with behavioral disturbance: Secondary | ICD-10-CM | POA: Diagnosis present

## 2015-03-03 DIAGNOSIS — D62 Acute posthemorrhagic anemia: Secondary | ICD-10-CM | POA: Diagnosis not present

## 2015-03-03 DIAGNOSIS — S72121S Displaced fracture of lesser trochanter of right femur, sequela: Secondary | ICD-10-CM

## 2015-03-03 DIAGNOSIS — F03918 Unspecified dementia, unspecified severity, with other behavioral disturbance: Secondary | ICD-10-CM | POA: Diagnosis present

## 2015-03-03 DIAGNOSIS — M7989 Other specified soft tissue disorders: Secondary | ICD-10-CM | POA: Diagnosis not present

## 2015-03-03 HISTORY — DX: Pain in right hip: M25.551

## 2015-03-03 HISTORY — DX: Fracture of unspecified part of neck of unspecified femur, initial encounter for closed fracture: S72.009A

## 2015-03-03 LAB — PROTIME-INR
INR: 1.14 (ref 0.00–1.49)
Prothrombin Time: 14.8 seconds (ref 11.6–15.2)

## 2015-03-03 LAB — CBC WITH DIFFERENTIAL/PLATELET
BASOS ABS: 0 10*3/uL (ref 0.0–0.1)
Basophils Relative: 0 % (ref 0–1)
EOS ABS: 0 10*3/uL (ref 0.0–0.7)
Eosinophils Relative: 0 % (ref 0–5)
HCT: 40 % (ref 36.0–46.0)
HEMOGLOBIN: 13.1 g/dL (ref 12.0–15.0)
Lymphocytes Relative: 27 % (ref 12–46)
Lymphs Abs: 4.4 10*3/uL — ABNORMAL HIGH (ref 0.7–4.0)
MCH: 30.8 pg (ref 26.0–34.0)
MCHC: 32.8 g/dL (ref 30.0–36.0)
MCV: 93.9 fL (ref 78.0–100.0)
Monocytes Absolute: 1 10*3/uL (ref 0.1–1.0)
Monocytes Relative: 6 % (ref 3–12)
NEUTROS ABS: 11 10*3/uL — AB (ref 1.7–7.7)
Neutrophils Relative %: 67 % (ref 43–77)
PLATELETS: 293 10*3/uL (ref 150–400)
RBC: 4.26 MIL/uL (ref 3.87–5.11)
RDW: 13.5 % (ref 11.5–15.5)
WBC: 16.4 10*3/uL — ABNORMAL HIGH (ref 4.0–10.5)

## 2015-03-03 LAB — BASIC METABOLIC PANEL
Anion gap: 14 (ref 5–15)
BUN: 26 mg/dL — AB (ref 6–23)
CHLORIDE: 102 mmol/L (ref 96–112)
CO2: 23 mmol/L (ref 19–32)
CREATININE: 1.34 mg/dL — AB (ref 0.50–1.10)
Calcium: 10.3 mg/dL (ref 8.4–10.5)
GFR calc Af Amer: 38 mL/min — ABNORMAL LOW (ref >90)
GFR calc non Af Amer: 33 mL/min — ABNORMAL LOW (ref >90)
Glucose, Bld: 95 mg/dL (ref 70–99)
POTASSIUM: 5 mmol/L (ref 3.5–5.1)
Sodium: 139 mmol/L (ref 135–145)

## 2015-03-03 MED ORDER — CLONAZEPAM 0.5 MG PO TABS
0.2500 mg | ORAL_TABLET | Freq: Two times a day (BID) | ORAL | Status: DC | PRN
  Filled 2015-03-03: qty 1

## 2015-03-03 MED ORDER — CEFAZOLIN SODIUM-DEXTROSE 2-3 GM-% IV SOLR
2.0000 g | Freq: Once | INTRAVENOUS | Status: AC
  Administered 2015-03-04: 2 g via INTRAVENOUS
  Filled 2015-03-03 (×2): qty 50

## 2015-03-03 MED ORDER — HEPARIN SODIUM (PORCINE) 5000 UNIT/ML IJ SOLN
5000.0000 [IU] | Freq: Three times a day (TID) | INTRAMUSCULAR | Status: DC
  Administered 2015-03-03 – 2015-03-04 (×2): 5000 [IU] via SUBCUTANEOUS
  Filled 2015-03-03 (×2): qty 1

## 2015-03-03 MED ORDER — MORPHINE SULFATE 4 MG/ML IJ SOLN
4.0000 mg | Freq: Once | INTRAMUSCULAR | Status: AC
  Administered 2015-03-03: 4 mg via INTRAVENOUS
  Filled 2015-03-03: qty 1

## 2015-03-03 MED ORDER — ESCITALOPRAM OXALATE 20 MG PO TABS
20.0000 mg | ORAL_TABLET | Freq: Every day | ORAL | Status: DC
  Administered 2015-03-05 – 2015-03-07 (×3): 20 mg via ORAL
  Filled 2015-03-03 (×4): qty 1

## 2015-03-03 MED ORDER — SODIUM CHLORIDE 0.9 % IV SOLN
INTRAVENOUS | Status: DC
  Administered 2015-03-05: 17:00:00 via INTRAVENOUS

## 2015-03-03 MED ORDER — KCL IN DEXTROSE-NACL 20-5-0.45 MEQ/L-%-% IV SOLN
INTRAVENOUS | Status: DC
  Administered 2015-03-03 – 2015-03-04 (×2): via INTRAVENOUS
  Filled 2015-03-03 (×3): qty 1000

## 2015-03-03 MED ORDER — MORPHINE SULFATE 2 MG/ML IJ SOLN
0.5000 mg | INTRAMUSCULAR | Status: DC | PRN
  Administered 2015-03-04 – 2015-03-05 (×5): 0.5 mg via INTRAVENOUS
  Filled 2015-03-03 (×5): qty 1

## 2015-03-03 MED ORDER — ONDANSETRON HCL 4 MG PO TABS: 4.0000 mg | ORAL_TABLET | Freq: Four times a day (QID) | ORAL | Status: DC | PRN

## 2015-03-03 MED ORDER — HYDRALAZINE HCL 20 MG/ML IJ SOLN: 5.0000 mg | Freq: Four times a day (QID) | INTRAMUSCULAR | Status: DC | PRN

## 2015-03-03 MED ORDER — HYDROCODONE-ACETAMINOPHEN 5-325 MG PO TABS
1.0000 | ORAL_TABLET | Freq: Four times a day (QID) | ORAL | Status: DC | PRN
  Administered 2015-03-04: 1 via ORAL
  Filled 2015-03-03: qty 1

## 2015-03-03 MED ORDER — MORPHINE SULFATE 2 MG/ML IJ SOLN
2.0000 mg | Freq: Once | INTRAMUSCULAR | Status: AC
  Administered 2015-03-03: 2 mg via INTRAVENOUS
  Filled 2015-03-03: qty 1

## 2015-03-03 MED ORDER — ONDANSETRON HCL 4 MG/2ML IJ SOLN: 4.0000 mg | Freq: Four times a day (QID) | INTRAMUSCULAR | Status: DC | PRN

## 2015-03-03 MED ORDER — SENNOSIDES-DOCUSATE SODIUM 8.6-50 MG PO TABS
1.0000 | ORAL_TABLET | Freq: Every day | ORAL | Status: DC
  Administered 2015-03-03 – 2015-03-06 (×3): 1 via ORAL
  Filled 2015-03-03 (×3): qty 1

## 2015-03-03 MED ORDER — METOPROLOL TARTRATE 25 MG PO TABS
25.0000 mg | ORAL_TABLET | Freq: Two times a day (BID) | ORAL | Status: DC
  Administered 2015-03-03: 25 mg via ORAL
  Filled 2015-03-03 (×2): qty 1

## 2015-03-03 MED ORDER — ALBUTEROL SULFATE (2.5 MG/3ML) 0.083% IN NEBU
2.5000 mg | INHALATION_SOLUTION | Freq: Four times a day (QID) | RESPIRATORY_TRACT | Status: DC | PRN
  Administered 2015-03-04 – 2015-03-05 (×2): 2.5 mg via RESPIRATORY_TRACT
  Filled 2015-03-03 (×2): qty 3

## 2015-03-03 NOTE — ED Notes (Signed)
Pt tripped over feet while walking with her walker and had a witnessed fall.  Pt landed on right side.  Pt c/o pain in right upper leg to knee only when moving.  Denies pain when still.  Denies LOC or hitting head.

## 2015-03-03 NOTE — ED Provider Notes (Signed)
CSN: [id number]     Arrival date & time 03/03/15  1200 History   First MD Initiated Contact with Patient 03/03/15 1220     Chief Complaint  Patient presents with  . Fall     HPI Patient presents to the emergency department after a fall today.  She states she slipped in her house.  She lives at home with assistance to keep an eye on her.  She reports severe pain in her right leg and right hip.  She does walk with a walker.  She reports landing on her right side.   Past Medical History  Diagnosis Date  . COPD (chronic obstructive pulmonary disease)   . Alzheimer's dementia   . Depression   . Chronic kidney disease   . Hypertension    History reviewed. No pertinent past surgical history. Family History  Problem Relation Age of Onset  . Suicidality Other    History  Substance Use Topics  . Smoking status: Never Smoker   . Smokeless tobacco: Not on file  . Alcohol Use: No   OB History    No data available     Review of Systems  All other systems reviewed and are negative.     Allergies  Aspirin  Home Medications   Prior to Admission medications   Medication Sig Start Date End Date Taking? Authorizing Provider  albuterol (PROVENTIL HFA;VENTOLIN HFA) 108 (90 BASE) MCG/ACT inhaler Inhale 1-2 puffs into the lungs every 6 (six) hours as needed for wheezing or shortness of breath. 10/01/14  Yes Richardean Canalavid H Yao, MD  clonazePAM (KLONOPIN) 0.5 MG tablet Take 0.5 tablets (0.25 mg total) by mouth 2 (two) times daily as needed for anxiety. 08/28/14  Yes Ripudeep Jenna LuoK Rai, MD  escitalopram (LEXAPRO) 20 MG tablet Take 1 tablet (20 mg total) by mouth daily. 08/28/14  Yes Ripudeep Jenna LuoK Rai, MD  loperamide (IMODIUM) 2 MG capsule Take 2 mg by mouth as needed for diarrhea or loose stools.   Yes Historical Provider, MD  losartan (COZAAR) 100 MG tablet Take 1 tablet (100 mg total) by mouth daily. 08/28/14  Yes Ripudeep Jenna LuoK Rai, MD  metoprolol tartrate (LOPRESSOR) 25 MG tablet Take 0.5 tablets (12.5 mg  total) by mouth 2 (two) times daily. Patient taking differently: Take 25 mg by mouth 2 (two) times daily.  08/28/14  Yes Ripudeep Jenna LuoK Rai, MD  acetaminophen (TYLENOL) 325 MG tablet Take 2 tablets (650 mg total) by mouth every 6 (six) hours as needed for mild pain or moderate pain (or Fever >/= 101). 08/26/14   Richarda OverlieNayana Abrol, MD   BP 156/86 mmHg  Pulse 74  Temp(Src) 97.9 F (36.6 C) (Oral)  Resp 22  Ht 5\' 6"  (1.676 m)  Wt 85 lb (38.556 kg)  BMI 13.73 kg/m2  SpO2 100% Physical Exam  Constitutional: She is oriented to person, place, and time. She appears well-developed and well-nourished. No distress.  HENT:  Head: Normocephalic and atraumatic.  Eyes: EOM are normal.  Neck: Normal range of motion.  Cardiovascular: Normal rate, regular rhythm and normal heart sounds.   Pulmonary/Chest: Effort normal and breath sounds normal.  Abdominal: Soft. She exhibits no distension. There is no tenderness.  Musculoskeletal: Normal range of motion.  Pain with range of motion of right hip.  Neurological: She is alert and oriented to person, place, and time.  Skin: Skin is warm and dry.  Psychiatric: She has a normal mood and affect. Judgment normal.  Nursing note and vitals reviewed.  ED Course  Procedures (including critical care time) Labs Review Labs Reviewed  CBC WITH DIFFERENTIAL/PLATELET - Abnormal; Notable for the following:    WBC 16.4 (*)    Neutro Abs 11.0 (*)    Lymphs Abs 4.4 (*)    All other components within normal limits  BASIC METABOLIC PANEL - Abnormal; Notable for the following:    BUN 26 (*)    Creatinine, Ser 1.34 (*)    GFR calc non Af Amer 33 (*)    GFR calc Af Amer 38 (*)    All other components within normal limits  PROTIME-INR    Imaging Review Dg Chest 1 View  03/03/2015   CLINICAL DATA:  Fall.  Preop.  No chest complaints.  EXAM: CHEST  1 VIEW  COMPARISON:  10/21/2014  FINDINGS: There is hyperinflation of the lungs compatible with COPD. Heart is upper limits  normal in size. Tortuosity of the thoracic aorta with scattered calcifications. Lungs are clear. No effusions. Multiple old healed left rib fractures.  IMPRESSION: Hyperinflation/COPD.  No active disease.   Electronically Signed   By: Charlett Nose M.D.   On: 03/03/2015 13:45   Ct Head Wo Contrast  03/03/2015   CLINICAL DATA:  Head injury after fall.  No loss of consciousness.  EXAM: CT HEAD WITHOUT CONTRAST  CT CERVICAL SPINE WITHOUT CONTRAST  TECHNIQUE: Multidetector CT imaging of the head and cervical spine was performed following the standard protocol without intravenous contrast. Multiplanar CT image reconstructions of the cervical spine were also generated.  COMPARISON:  CT scan of August 25, 2014.  FINDINGS: CT HEAD FINDINGS  Bony calvarium appears intact. Moderate diffuse cortical atrophy is noted. Mild chronic ischemic white matter disease is noted. No mass effect or midline shift is noted. Ventricular size is within normal limits. There is no evidence of mass lesion, hemorrhage or acute infarction.  CT CERVICAL SPINE FINDINGS  No fracture or significant spondylolisthesis is noted. Moderate degenerative disc disease is noted at C5-6 and C6-7 with anterior osteophyte formation. Visualized lung apices appear normal. Degenerative changes seen involving the posterior facet joints of throughout the cervical spine bilaterally.  IMPRESSION: Moderate diffuse cortical atrophy. Moderate chronic ischemic white matter disease. No acute intracranial abnormality seen.  Multilevel degenerative disc disease is noted. No acute abnormality seen in the cervical spine.   Electronically Signed   By: Lupita Raider, M.D.   On: 03/03/2015 13:53   Ct Cervical Spine Wo Contrast  03/03/2015   CLINICAL DATA:  Head injury after fall.  No loss of consciousness.  EXAM: CT HEAD WITHOUT CONTRAST  CT CERVICAL SPINE WITHOUT CONTRAST  TECHNIQUE: Multidetector CT imaging of the head and cervical spine was performed following the  standard protocol without intravenous contrast. Multiplanar CT image reconstructions of the cervical spine were also generated.  COMPARISON:  CT scan of August 25, 2014.  FINDINGS: CT HEAD FINDINGS  Bony calvarium appears intact. Moderate diffuse cortical atrophy is noted. Mild chronic ischemic white matter disease is noted. No mass effect or midline shift is noted. Ventricular size is within normal limits. There is no evidence of mass lesion, hemorrhage or acute infarction.  CT CERVICAL SPINE FINDINGS  No fracture or significant spondylolisthesis is noted. Moderate degenerative disc disease is noted at C5-6 and C6-7 with anterior osteophyte formation. Visualized lung apices appear normal. Degenerative changes seen involving the posterior facet joints of throughout the cervical spine bilaterally.  IMPRESSION: Moderate diffuse cortical atrophy. Moderate chronic ischemic white matter disease. No  acute intracranial abnormality seen.  Multilevel degenerative disc disease is noted. No acute abnormality seen in the cervical spine.   Electronically Signed   By: Lupita Raider, M.D.   On: 03/03/2015 13:53   Ct Hip Right Wo Contrast  03/03/2015   CLINICAL DATA:  81 year old with right hip pain after falling. Evaluate for fracture. History of chronic kidney disease, hypertension and Alzheimer's. Initial encounter.  EXAM: CT OF THE RIGHT HIP WITHOUT CONTRAST  TECHNIQUE: Multidetector CT imaging of the right hip was performed according to the standard protocol. Multiplanar CT image reconstructions were also generated.  COMPARISON:  Radiograph same date.  FINDINGS: Examination is limited to right hip and right hemipelvis. The bones are moderately demineralized.  As demonstrated radiographically, there is a displaced comminuted fracture of the right femoral lesser trochanter. This is consistent with an avulsion fracture, and the iliopsoas tendon inserts on the avulsed fragment. There is a nondisplaced fracture of the greater  trochanter, best seen on the coronal images. No definite displaced intertrochanteric or basicervical fracture identified.  Moderate right hip degenerative changes are present. There is a chondroid lesion inferiorly in the right femoral head which demonstrates no aggressive characteristics. Degenerative changes are also present at the symphysis pubis and right sacroiliac joint. On the sagittal images, there are degenerative changes in the lower coccyx.  There is subcutaneous edema surrounding the right hip without large soft tissue hematoma or joint effusion.  Incidentally noted is a 4.3 cm lipoma dependently within the cecum, possibly related to the ileocecal valve.  IMPRESSION: 1. CT confirms the presence of a displaced comminuted fracture of the right femoral lesser trochanter. In addition, there is a nondisplaced fracture of the greater trochanter. 2. Given these 2 fractures, there is at least moderate probability of an occult intertrochanteric fracture, not demonstrated by this examination. 3. Degenerative changes as described.   Electronically Signed   By: Carey Bullocks M.D.   On: 03/03/2015 15:19   Dg Hip Unilat With Pelvis 2-3 Views Right  03/03/2015   CLINICAL DATA:  Acute right proximal femur pain after fall. Initial encounter.  EXAM: RIGHT HIP (WITH PELVIS) 2-3 VIEWS  COMPARISON:  None.  FINDINGS: Severely displaced fracture of lesser trochanter of proximal right femur is noted. Severe degenerative joint disease of right hip is noted. Mild degenerative joint disease of left hip is noted.  IMPRESSION: Severely displaced avulsion fracture of lesser trochanter of proximal right femur is noted.   Electronically Signed   By: Lupita Raider, M.D.   On: 03/03/2015 13:44   Dg Femur, Min 2 Views Right  03/03/2015   CLINICAL DATA:  Pain following fall  EXAM: RIGHT FEMUR 2 VIEWS  COMPARISON:  None.  FINDINGS: Frontal and lateral views were obtained. There is avulsion of the lesser trochanter. No other  fracture demonstrated. No dislocation. There is moderate narrowing of the hip and knee joints on the right. No knee joint effusion.  IMPRESSION: Avulsion of the lesser trochanter. No dislocation. Osteoarthritic change in the right hip and knee joints.   Electronically Signed   By: Bretta Bang III M.D.   On: 03/03/2015 13:45  I personally reviewed the imaging tests through PACS system I reviewed available ER/hospitalization records through the EMR    EKG Interpretation   Date/Time:  Tuesday March 03 2015 12:55:42 EDT Ventricular Rate:  61 PR Interval:  212 QRS Duration: 73 QT Interval:  481 QTC Calculation: 484 R Axis:   74 Text Interpretation:  Sinus rhythm  Borderline prolonged PR interval  Anteroseptal infarct, age indeterminate Artifact in lead(s) I III aVR aVL  No significant change was found Confirmed by Jo Booze  MD, Caryn Bee ([id number]) on  03/03/2015 12:57:44 PM      MDM   Final diagnoses:  Fall  Pain  Greater trochanter fracture, right, closed, initial encounter  Closed avulsion fracture of lesser trochanter of femur, right, sequela    Patient is having ongoing pain in her right hip.  I spoke with Dr. Ophelia Charter of orthopedics who agrees that the patient has a high likelihood of having a right intertrochanteric fracture.  Patient be admitted for pain control.  Dr. Ophelia Charter will see her in the hospital.    Azalia Bilis, MD 03/03/15 [phone]

## 2015-03-03 NOTE — H&P (Signed)
[facility] History and Physical  Andrea [name] EAV:[id number] DOB: Sep 10, 1919 DOA: 03/03/2015  Referring physician: Dr. Patria Mane PCP: Thayer Headings, MD   Chief Complaint: hip pain after fall  HPI: Andrea [name] is a 81 y.o. female with history of dementia, hypertension, and chronic kidney disease stage III Presenting to the hospital after mechanical fall landed on right side. Developed pain at right side subsequently. As a result patient was brought to the ED for further evaluation and recommendations. While in the ED patient has had workup which revealed after CT scan of hip a displaced comminuted fracture of the right femoral lesser trochanter and a nondisplaced fracture of the greater trochanter. Currently suspicion is for occult intertrochanteric fracture. Patient also had CT scan of head given history of fall and there is no acute intracranial abnormality reported  Given nature of fracture we were consulted for medical evaluation prior to orthopedic surgery eval.   Review of Systems:  Unable to accurately assess secondary to dementia  Past Medical History  Diagnosis Date  . COPD (chronic obstructive pulmonary disease)   . Alzheimer's dementia   . Depression   . Chronic kidney disease   . Hypertension    History reviewed. No pertinent past surgical history. Social History:  reports that she has never smoked. She does not have any smokeless tobacco history on file. She reports that she does not drink alcohol or use illicit drugs.  Allergies  Allergen Reactions  . Aspirin Other (See Comments)    Stomach upset    Family History  Problem Relation Age of Onset  . Suicidality Other     Prior to Admission medications   Medication Sig Start Date End Date Taking? Authorizing Provider  albuterol (PROVENTIL HFA;VENTOLIN HFA) 108 (90 BASE) MCG/ACT inhaler Inhale 1-2 puffs into the lungs every 6 (six) hours as needed for wheezing or shortness of breath. 10/01/14   Yes Richardean Canal, MD  clonazePAM (KLONOPIN) 0.5 MG tablet Take 0.5 tablets (0.25 mg total) by mouth 2 (two) times daily as needed for anxiety. 08/28/14  Yes Ripudeep Jenna Luo, MD  escitalopram (LEXAPRO) 20 MG tablet Take 1 tablet (20 mg total) by mouth daily. 08/28/14  Yes Ripudeep Jenna Luo, MD  loperamide (IMODIUM) 2 MG capsule Take 2 mg by mouth as needed for diarrhea or loose stools.   Yes Historical Provider, MD  losartan (COZAAR) 100 MG tablet Take 1 tablet (100 mg total) by mouth daily. 08/28/14  Yes Ripudeep Jenna Luo, MD  metoprolol tartrate (LOPRESSOR) 25 MG tablet Take 0.5 tablets (12.5 mg total) by mouth 2 (two) times daily. Patient taking differently: Take 25 mg by mouth 2 (two) times daily.  08/28/14  Yes Ripudeep Jenna Luo, MD  acetaminophen (TYLENOL) 325 MG tablet Take 2 tablets (650 mg total) by mouth every 6 (six) hours as needed for mild pain or moderate pain (or Fever >/= 101). 08/26/14   Richarda Overlie, MD   Physical Exam: Filed Vitals:   03/03/15 1630 03/03/15 1700 03/03/15 1730 03/03/15 1745  BP: 158/74 155/71 150/69 160/127  Pulse: 66 64 64 70  Temp:      TempSrc:      Resp: Height:      Weight:      SpO2: 100% 96% 91% 97%    Wt Readings from Last 3 Encounters:  03/03/15 38.556 kg (85 lb)  02/26/15 38.737 kg (85 lb 6.4 oz)  09/14/14 48.988 kg (108 lb)    General:  In no  acute distress, laying supine in bed, alert and awake Eyes: PERRL, normal lids, irises & conjunctiva ENT: grossly normal hearing, lips & tongue, dry mucous membranes Neck: no LAD, masses or thyromegaly Cardiovascular: RRR, no m/r/g. No LE edema. Respiratory: CTA bilaterally, no w/r/r. Normal respiratory effort. Abdomen: soft, nt, nd Skin: no rash or induration seen on limited exam Musculoskeletal: grossly normal tone BUE/BLE Psychiatric: Normal mood and affect Neurologic: Answers questions appropriately sensation to light touch intact bilaterally at lower extremities.           Labs on  Admission:  Basic Metabolic Panel:  Recent Labs Lab 02/26/15 1658 03/03/15 1400  NA 135 139  K 4.7 5.0  CL 99 102  CO2 20 23  GLUCOSE 91 95  BUN 26* 26*  CREATININE 1.40* 1.34*  CALCIUM 9.8 10.3   Liver Function Tests: No results for input(s): AST, ALT, ALKPHOS, BILITOT, PROT, ALBUMIN in the last 168 hours. No results for input(s): LIPASE, AMYLASE in the last 168 hours. No results for input(s): AMMONIA in the last 168 hours. CBC:  Recent Labs Lab 03/03/15 1400  WBC 16.4*  NEUTROABS 11.0*  HGB 13.1  HCT 40.0  MCV 93.9  PLT 293   Cardiac Enzymes: No results for input(s): CKTOTAL, CKMB, CKMBINDEX, TROPONINI in the last 168 hours.  BNP (last 3 results) No results for input(s): BNP in the last 8760 hours.  ProBNP (last 3 results)  Recent Labs  09/12/14 0102 10/01/14 0938  PROBNP 9765.0* 1346.0*    CBG: No results for input(s): GLUCAP in the last 168 hours.  Radiological Exams on Admission: Dg Chest 1 View  03/03/2015   CLINICAL DATA:  Fall.  Preop.  No chest complaints.  EXAM: CHEST  1 VIEW  COMPARISON:  10/21/2014  FINDINGS: There is hyperinflation of the lungs compatible with COPD. Heart is upper limits normal in size. Tortuosity of the thoracic aorta with scattered calcifications. Lungs are clear. No effusions. Multiple old healed left rib fractures.  IMPRESSION: Hyperinflation/COPD.  No active disease.   Electronically Signed   By: Charlett NoseKevin  Dover M.D.   On: 03/03/2015 13:45   Ct Head Wo Contrast  03/03/2015   CLINICAL DATA:  Head injury after fall.  No loss of consciousness.  EXAM: CT HEAD WITHOUT CONTRAST  CT CERVICAL SPINE WITHOUT CONTRAST  TECHNIQUE: Multidetector CT imaging of the head and cervical spine was performed following the standard protocol without intravenous contrast. Multiplanar CT image reconstructions of the cervical spine were also generated.  COMPARISON:  CT scan of August 25, 2014.  FINDINGS: CT HEAD FINDINGS  Bony calvarium appears intact.  Moderate diffuse cortical atrophy is noted. Mild chronic ischemic white matter disease is noted. No mass effect or midline shift is noted. Ventricular size is within normal limits. There is no evidence of mass lesion, hemorrhage or acute infarction.  CT CERVICAL SPINE FINDINGS  No fracture or significant spondylolisthesis is noted. Moderate degenerative disc disease is noted at C5-6 and C6-7 with anterior osteophyte formation. Visualized lung apices appear normal. Degenerative changes seen involving the posterior facet joints of throughout the cervical spine bilaterally.  IMPRESSION: Moderate diffuse cortical atrophy. Moderate chronic ischemic white matter disease. No acute intracranial abnormality seen.  Multilevel degenerative disc disease is noted. No acute abnormality seen in the cervical spine.   Electronically Signed   By: Lupita RaiderJames  Green Jr, M.D.   On: 03/03/2015 13:53   Ct Cervical Spine Wo Contrast  03/03/2015   CLINICAL DATA:  Head injury after fall.  No loss of consciousness.  EXAM: CT HEAD WITHOUT CONTRAST  CT CERVICAL SPINE WITHOUT CONTRAST  TECHNIQUE: Multidetector CT imaging of the head and cervical spine was performed following the standard protocol without intravenous contrast. Multiplanar CT image reconstructions of the cervical spine were also generated.  COMPARISON:  CT scan of August 25, 2014.  FINDINGS: CT HEAD FINDINGS  Bony calvarium appears intact. Moderate diffuse cortical atrophy is noted. Mild chronic ischemic white matter disease is noted. No mass effect or midline shift is noted. Ventricular size is within normal limits. There is no evidence of mass lesion, hemorrhage or acute infarction.  CT CERVICAL SPINE FINDINGS  No fracture or significant spondylolisthesis is noted. Moderate degenerative disc disease is noted at C5-6 and C6-7 with anterior osteophyte formation. Visualized lung apices appear normal. Degenerative changes seen involving the posterior facet joints of throughout the  cervical spine bilaterally.  IMPRESSION: Moderate diffuse cortical atrophy. Moderate chronic ischemic white matter disease. No acute intracranial abnormality seen.  Multilevel degenerative disc disease is noted. No acute abnormality seen in the cervical spine.   Electronically Signed   By: Lupita Raider, M.D.   On: 03/03/2015 13:53   Ct Hip Right Wo Contrast  03/03/2015   CLINICAL DATA:  81 year old with right hip pain after falling. Evaluate for fracture. History of chronic kidney disease, hypertension and Alzheimer's. Initial encounter.  EXAM: CT OF THE RIGHT HIP WITHOUT CONTRAST  TECHNIQUE: Multidetector CT imaging of the right hip was performed according to the standard protocol. Multiplanar CT image reconstructions were also generated.  COMPARISON:  Radiograph same date.  FINDINGS: Examination is limited to right hip and right hemipelvis. The bones are moderately demineralized.  As demonstrated radiographically, there is a displaced comminuted fracture of the right femoral lesser trochanter. This is consistent with an avulsion fracture, and the iliopsoas tendon inserts on the avulsed fragment. There is a nondisplaced fracture of the greater trochanter, best seen on the coronal images. No definite displaced intertrochanteric or basicervical fracture identified.  Moderate right hip degenerative changes are present. There is a chondroid lesion inferiorly in the right femoral head which demonstrates no aggressive characteristics. Degenerative changes are also present at the symphysis pubis and right sacroiliac joint. On the sagittal images, there are degenerative changes in the lower coccyx.  There is subcutaneous edema surrounding the right hip without large soft tissue hematoma or joint effusion.  Incidentally noted is a 4.3 cm lipoma dependently within the cecum, possibly related to the ileocecal valve.  IMPRESSION: 1. CT confirms the presence of a displaced comminuted fracture of the right femoral lesser  trochanter. In addition, there is a nondisplaced fracture of the greater trochanter. 2. Given these 2 fractures, there is at least moderate probability of an occult intertrochanteric fracture, not demonstrated by this examination. 3. Degenerative changes as described.   Electronically Signed   By: Carey Bullocks M.D.   On: 03/03/2015 15:19   Dg Hip Unilat With Pelvis 2-3 Views Right  03/03/2015   CLINICAL DATA:  Acute right proximal femur pain after fall. Initial encounter.  EXAM: RIGHT HIP (WITH PELVIS) 2-3 VIEWS  COMPARISON:  None.  FINDINGS: Severely displaced fracture of lesser trochanter of proximal right femur is noted. Severe degenerative joint disease of right hip is noted. Mild degenerative joint disease of left hip is noted.  IMPRESSION: Severely displaced avulsion fracture of lesser trochanter of proximal right femur is noted.   Electronically Signed   By: Lupita Raider, M.D.   On:  03/03/2015 13:44   Dg Femur, Min 2 Views Right  03/03/2015   CLINICAL DATA:  Pain following fall  EXAM: RIGHT FEMUR 2 VIEWS  COMPARISON:  None.  FINDINGS: Frontal and lateral views were obtained. There is avulsion of the lesser trochanter. No other fracture demonstrated. No dislocation. There is moderate narrowing of the hip and knee joints on the right. No knee joint effusion.  IMPRESSION: Avulsion of the lesser trochanter. No dislocation. Osteoarthritic change in the right hip and knee joints.   Electronically Signed   By: Bretta Bang III M.D.   On: 03/03/2015 13:45    EKG: Independently reviewed. Sinus rhythm with no ST elevations or depressions  Assessment/Plan   Right hip pain/Hip fracture - Hip fracture order set placed - Ortho consulted by ED physician - After operation please place PT order for evaluation  Active Problems:   Essential hypertension - Not well controlled at this juncture we'll continue beta blocker - Hydralazine when necessary elevated blood pressure    Dementia without  behavioral disturbance - Stable continue home regimen    CKD (chronic kidney disease), stage III - We'll hold ARB - Stable  Dehydration - Elevated BUN/creatinine ratio. As such will gently hydrate - Reassess BMP next a.m.   Code Status: DO NOT RESUSCITATE DVT Prophylaxis: Heparin Family Communication: Discussed with daughter at bedside  Disposition Plan: Pending evaluation by orthopedic surgeon  Time spent:> 45 minutes  Penny Pia [facility] Pager [phone]

## 2015-03-03 NOTE — Consult Note (Signed)
Reason for Consult:fall with hip fracture Referring Physician: Dr. [name] is an 81 y.o. female.  HPI: 81 yo with Hx of falls. Ambulates with assistance , uses a walker and sometimes walks without walker.  Right IT hip Fx  Past Medical History  Diagnosis Date  . COPD (chronic obstructive pulmonary disease)   . Alzheimer's dementia   . Depression   . Chronic kidney disease   . Hypertension     History reviewed. No pertinent past surgical history.  Family History  Problem Relation Age of Onset  . Suicidality Other     Social History:  reports that she has never smoked. She does not have any smokeless tobacco history on file. She reports that she does not drink alcohol or use illicit drugs.  Allergies:  Allergies  Allergen Reactions  . Aspirin Other (See Comments)    Stomach upset    Medications: I have reviewed the patient's current medications.  Results for orders placed or performed during the hospital encounter of 03/03/15 (from the past 48 hour(s))  CBC with Differential/Platelet     Status: Abnormal   Collection Time: 03/03/15  2:00 PM  Result Value Ref Range   WBC 16.4 (H) 4.0 - 10.5 K/uL   RBC 4.26 3.87 - 5.11 MIL/uL   Hemoglobin 13.1 12.0 - 15.0 g/dL   HCT 40.0 36.0 - 46.0 %   MCV 93.9 78.0 - 100.0 fL   MCH 30.8 26.0 - 34.0 pg   MCHC 32.8 30.0 - 36.0 g/dL   RDW 13.5 11.5 - 15.5 %   Platelets 293 150 - 400 K/uL   Neutrophils Relative % 67 43 - 77 %   Neutro Abs 11.0 (H) 1.7 - 7.7 K/uL   Lymphocytes Relative 27 12 - 46 %   Lymphs Abs 4.4 (H) 0.7 - 4.0 K/uL   Monocytes Relative 6 3 - 12 %   Monocytes Absolute 1.0 0.1 - 1.0 K/uL   Eosinophils Relative 0 0 - 5 %   Eosinophils Absolute 0.0 0.0 - 0.7 K/uL   Basophils Relative 0 0 - 1 %   Basophils Absolute 0.0 0.0 - 0.1 K/uL  Basic metabolic panel     Status: Abnormal   Collection Time: 03/03/15  2:00 PM  Result Value Ref Range   Sodium 139 135 - 145 mmol/L   Potassium 5.0 3.5 - 5.1 mmol/L    Chloride 102 96 - 112 mmol/L   CO2 23 19 - 32 mmol/L   Glucose, Bld 95 70 - 99 mg/dL   BUN 26 (H) 6 - 23 mg/dL   Creatinine, Ser 1.34 (H) 0.50 - 1.10 mg/dL   Calcium 10.3 8.4 - 10.5 mg/dL   GFR calc non Af Amer 33 (L) >90 mL/min   GFR calc Af Amer 38 (L) >90 mL/min    Comment: (NOTE) The eGFR has been calculated using the CKD EPI equation. This calculation has not been validated in all clinical situations. eGFR's persistently <90 mL/min signify possible Chronic Kidney Disease.    Anion gap 14 5 - 15  Protime-INR     Status: None   Collection Time: 03/03/15  2:00 PM  Result Value Ref Range   Prothrombin Time 14.8 11.6 - 15.2 seconds   INR 1.14 0.00 - 1.49    Dg Chest 1 View  03/03/2015   CLINICAL DATA:  Fall.  Preop.  No chest complaints.  EXAM: CHEST  1 VIEW  COMPARISON:  10/21/2014  FINDINGS: There is hyperinflation  of the lungs compatible with COPD. Heart is upper limits normal in size. Tortuosity of the thoracic aorta with scattered calcifications. Lungs are clear. No effusions. Multiple old healed left rib fractures.  IMPRESSION: Hyperinflation/COPD.  No active disease.   Electronically Signed   By: Andrea [name] M.D.   On: 03/03/2015 13:45   Ct Head Wo Contrast  03/03/2015   CLINICAL DATA:  Head injury after fall.  No loss of consciousness.  EXAM: CT HEAD WITHOUT CONTRAST  CT CERVICAL SPINE WITHOUT CONTRAST  TECHNIQUE: Multidetector CT imaging of the head and cervical spine was performed following the standard protocol without intravenous contrast. Multiplanar CT image reconstructions of the cervical spine were also generated.  COMPARISON:  CT scan of August 25, 2014.  FINDINGS: CT HEAD FINDINGS  Bony calvarium appears intact. Moderate diffuse cortical atrophy is noted. Mild chronic ischemic white matter disease is noted. No mass effect or midline shift is noted. Ventricular size is within normal limits. There is no evidence of mass lesion, hemorrhage or acute infarction.  CT  CERVICAL SPINE FINDINGS  No fracture or significant spondylolisthesis is noted. Moderate degenerative disc disease is noted at C5-6 and C6-7 with anterior osteophyte formation. Visualized lung apices appear normal. Degenerative changes seen involving the posterior facet joints of throughout the cervical spine bilaterally.  IMPRESSION: Moderate diffuse cortical atrophy. Moderate chronic ischemic white matter disease. No acute intracranial abnormality seen.  Multilevel degenerative disc disease is noted. No acute abnormality seen in the cervical spine.   Electronically Signed   By: Andrea [name], M.D.   On: 03/03/2015 13:53   Ct Cervical Spine Wo Contrast  03/03/2015   CLINICAL DATA:  Head injury after fall.  No loss of consciousness.  EXAM: CT HEAD WITHOUT CONTRAST  CT CERVICAL SPINE WITHOUT CONTRAST  TECHNIQUE: Multidetector CT imaging of the head and cervical spine was performed following the standard protocol without intravenous contrast. Multiplanar CT image reconstructions of the cervical spine were also generated.  COMPARISON:  CT scan of August 25, 2014.  FINDINGS: CT HEAD FINDINGS  Bony calvarium appears intact. Moderate diffuse cortical atrophy is noted. Mild chronic ischemic white matter disease is noted. No mass effect or midline shift is noted. Ventricular size is within normal limits. There is no evidence of mass lesion, hemorrhage or acute infarction.  CT CERVICAL SPINE FINDINGS  No fracture or significant spondylolisthesis is noted. Moderate degenerative disc disease is noted at C5-6 and C6-7 with anterior osteophyte formation. Visualized lung apices appear normal. Degenerative changes seen involving the posterior facet joints of throughout the cervical spine bilaterally.  IMPRESSION: Moderate diffuse cortical atrophy. Moderate chronic ischemic white matter disease. No acute intracranial abnormality seen.  Multilevel degenerative disc disease is noted. No acute abnormality seen in the cervical  spine.   Electronically Signed   By: Andrea [name], M.D.   On: 03/03/2015 13:53   Ct Hip Right Wo Contrast  03/03/2015   CLINICAL DATA:  81 year old with right hip pain after falling. Evaluate for fracture. History of chronic kidney disease, hypertension and Alzheimer's. Initial encounter.  EXAM: CT OF THE RIGHT HIP WITHOUT CONTRAST  TECHNIQUE: Multidetector CT imaging of the right hip was performed according to the standard protocol. Multiplanar CT image reconstructions were also generated.  COMPARISON:  Radiograph same date.  FINDINGS: Examination is limited to right hip and right hemipelvis. The bones are moderately demineralized.  As demonstrated radiographically, there is a displaced comminuted fracture of the right femoral lesser trochanter. This is  consistent with an avulsion fracture, and the iliopsoas tendon inserts on the avulsed fragment. There is a nondisplaced fracture of the greater trochanter, best seen on the coronal images. No definite displaced intertrochanteric or basicervical fracture identified.  Moderate right hip degenerative changes are present. There is a chondroid lesion inferiorly in the right femoral head which demonstrates no aggressive characteristics. Degenerative changes are also present at the symphysis pubis and right sacroiliac joint. On the sagittal images, there are degenerative changes in the lower coccyx.  There is subcutaneous edema surrounding the right hip without large soft tissue hematoma or joint effusion.  Incidentally noted is a 4.3 cm lipoma dependently within the cecum, possibly related to the ileocecal valve.  IMPRESSION: 1. CT confirms the presence of a displaced comminuted fracture of the right femoral lesser trochanter. In addition, there is a nondisplaced fracture of the greater trochanter. 2. Given these 2 fractures, there is at least moderate probability of an occult intertrochanteric fracture, not demonstrated by this examination. 3. Degenerative changes  as described.   Electronically Signed   By: Andrea [name] M.D.   On: 03/03/2015 15:19   Dg Hip Unilat With Pelvis 2-3 Views Right  03/03/2015   CLINICAL DATA:  Acute right proximal femur pain after fall. Initial encounter.  EXAM: RIGHT HIP (WITH PELVIS) 2-3 VIEWS  COMPARISON:  None.  FINDINGS: Severely displaced fracture of lesser trochanter of proximal right femur is noted. Severe degenerative joint disease of right hip is noted. Mild degenerative joint disease of left hip is noted.  IMPRESSION: Severely displaced avulsion fracture of lesser trochanter of proximal right femur is noted.   Electronically Signed   By: Andrea [name], M.D.   On: 03/03/2015 13:44   Dg Femur, Min 2 Views Right  03/03/2015   CLINICAL DATA:  Pain following fall  EXAM: RIGHT FEMUR 2 VIEWS  COMPARISON:  None.  FINDINGS: Frontal and lateral views were obtained. There is avulsion of the lesser trochanter. No other fracture demonstrated. No dislocation. There is moderate narrowing of the hip and knee joints on the right. No knee joint effusion.  IMPRESSION: Avulsion of the lesser trochanter. No dislocation. Osteoarthritic change in the right hip and knee joints.   Electronically Signed   By: Andrea [name] M.D.   On: 03/03/2015 13:45    Review of Systems  Constitutional: Negative for fever and chills.  Respiratory: Negative for cough.   Cardiovascular: Negative for chest pain and orthopnea.  Psychiatric/Behavioral: Positive for depression and memory loss. The patient is nervous/anxious.    Blood pressure 160/127, pulse 70, temperature 97.9 F (36.6 C), temperature source Oral, resp. rate 22, height 5' 6"  (1.676 m), weight 38.556 kg (85 lb), SpO2 97 %. Physical Exam  Constitutional: She appears well-developed and well-nourished.  HENT:  Head: Normocephalic and atraumatic.  Eyes: Pupils are equal, round, and reactive to light.  Neck: Normal range of motion.  Cardiovascular: Normal rate, regular rhythm and normal  heart sounds.   Respiratory: Effort normal.  GI: Soft.  Musculoskeletal:  Pain with right hip ROM.   Psychiatric:  Confused, had morphine, has some dementia    Assessment/Plan: Right IT hip fx for surgery tomorrow afternoon. NPO after MN  Andrea [name] C 03/03/2015, 6:35 PM

## 2015-03-04 ENCOUNTER — Inpatient Hospital Stay (HOSPITAL_COMMUNITY): Payer: Medicare Other

## 2015-03-04 ENCOUNTER — Encounter (HOSPITAL_COMMUNITY)
Admission: EM | Disposition: A | Discharge status: Skilled Nursing Facility | Payer: Self-pay | Source: Home / Self Care | Attending: Internal Medicine

## 2015-03-04 ENCOUNTER — Inpatient Hospital Stay (HOSPITAL_COMMUNITY): Payer: Medicare Other | Admitting: Anesthesiology

## 2015-03-04 DIAGNOSIS — R52 Pain, unspecified: Secondary | ICD-10-CM

## 2015-03-04 DIAGNOSIS — E43 Unspecified severe protein-calorie malnutrition: Secondary | ICD-10-CM | POA: Insufficient documentation

## 2015-03-04 HISTORY — PX: INTRAMEDULLARY (IM) NAIL INTERTROCHANTERIC: SHX5875

## 2015-03-04 LAB — CBC
HEMATOCRIT: 35.3 % — AB (ref 36.0–46.0)
HEMOGLOBIN: 11.5 g/dL — AB (ref 12.0–15.0)
MCH: 31.3 pg (ref 26.0–34.0)
MCHC: 32.6 g/dL (ref 30.0–36.0)
MCV: 95.9 fL (ref 78.0–100.0)
Platelets: 251 10*3/uL (ref 150–400)
RBC: 3.68 MIL/uL — AB (ref 3.87–5.11)
RDW: 13.6 % (ref 11.5–15.5)
WBC: 12.7 10*3/uL — ABNORMAL HIGH (ref 4.0–10.5)

## 2015-03-04 LAB — URINE MICROSCOPIC-ADD ON

## 2015-03-04 LAB — URINALYSIS, ROUTINE W REFLEX MICROSCOPIC
BILIRUBIN URINE: NEGATIVE
Glucose, UA: NEGATIVE mg/dL
Ketones, ur: 15 mg/dL — AB
Nitrite: NEGATIVE
Protein, ur: 30 mg/dL — AB
Specific Gravity, Urine: >1.03 — ABNORMAL HIGH (ref 1.005–1.030)
UROBILINOGEN UA: 0.2 mg/dL (ref 0.0–1.0)
pH: 6 (ref 5.0–8.0)

## 2015-03-04 LAB — BASIC METABOLIC PANEL
ANION GAP: 10 (ref 5–15)
BUN: 29 mg/dL — ABNORMAL HIGH (ref 6–23)
CHLORIDE: 103 mmol/L (ref 96–112)
CO2: 24 mmol/L (ref 19–32)
Calcium: 9.5 mg/dL (ref 8.4–10.5)
Creatinine, Ser: 1.43 mg/dL — ABNORMAL HIGH (ref 0.50–1.10)
GFR calc non Af Amer: 31 mL/min — ABNORMAL LOW (ref >90)
GFR, EST AFRICAN AMERICAN: 35 mL/min — AB (ref >90)
Glucose, Bld: 117 mg/dL — ABNORMAL HIGH (ref 70–99)
Potassium: 4.6 mmol/L (ref 3.5–5.1)
SODIUM: 137 mmol/L (ref 135–145)

## 2015-03-04 SURGERY — FIXATION, FRACTURE, INTERTROCHANTERIC, WITH INTRAMEDULLARY ROD
Anesthesia: General | Site: Hip | Laterality: Right

## 2015-03-04 MED ORDER — ROCURONIUM BROMIDE 100 MG/10ML IV SOLN
INTRAVENOUS | Status: DC | PRN
  Administered 2015-03-04: 20 mg via INTRAVENOUS

## 2015-03-04 MED ORDER — PROPOFOL 10 MG/ML IV BOLUS
INTRAVENOUS | Status: AC
  Filled 2015-03-04: qty 20

## 2015-03-04 MED ORDER — 0.9 % SODIUM CHLORIDE (POUR BTL) OPTIME
TOPICAL | Status: DC | PRN
  Administered 2015-03-04: 1000 mL

## 2015-03-04 MED ORDER — HYDROCODONE-ACETAMINOPHEN 5-325 MG PO TABS
1.0000 | ORAL_TABLET | ORAL | Status: DC | PRN
  Administered 2015-03-04: 1 via ORAL
  Filled 2015-03-04: qty 1

## 2015-03-04 MED ORDER — ACETAMINOPHEN 650 MG RE SUPP
650.0000 mg | Freq: Four times a day (QID) | RECTAL | Status: DC | PRN
Start: 2015-03-04 — End: 2015-03-07

## 2015-03-04 MED ORDER — ONDANSETRON HCL 4 MG/2ML IJ SOLN
INTRAMUSCULAR | Status: AC
  Filled 2015-03-04: qty 2

## 2015-03-04 MED ORDER — GLYCOPYRROLATE 0.2 MG/ML IJ SOLN
INTRAMUSCULAR | Status: DC | PRN
  Administered 2015-03-04: 0.4 mg via INTRAVENOUS

## 2015-03-04 MED ORDER — NEOSTIGMINE METHYLSULFATE 10 MG/10ML IV SOLN
INTRAVENOUS | Status: AC
  Filled 2015-03-04: qty 1

## 2015-03-04 MED ORDER — METOCLOPRAMIDE HCL 5 MG PO TABS: 5.0000 mg | ORAL_TABLET | Freq: Three times a day (TID) | ORAL | Status: DC | PRN

## 2015-03-04 MED ORDER — METOPROLOL TARTRATE 1 MG/ML IV SOLN: 2.5000 mg | Freq: Three times a day (TID) | INTRAVENOUS | Status: DC | PRN

## 2015-03-04 MED ORDER — FENTANYL CITRATE 0.05 MG/ML IJ SOLN
INTRAMUSCULAR | Status: DC | PRN
  Administered 2015-03-04 (×2): 25 ug via INTRAVENOUS
  Administered 2015-03-04: 50 ug via INTRAVENOUS

## 2015-03-04 MED ORDER — NEOSTIGMINE METHYLSULFATE 10 MG/10ML IV SOLN
INTRAVENOUS | Status: DC | PRN
  Administered 2015-03-04: 2 mg via INTRAVENOUS

## 2015-03-04 MED ORDER — FENTANYL CITRATE 0.05 MG/ML IJ SOLN
INTRAMUSCULAR | Status: AC
  Filled 2015-03-04: qty 2

## 2015-03-04 MED ORDER — ACETAMINOPHEN 160 MG/5ML PO SOLN
325.0000 mg | ORAL | Status: DC | PRN
  Filled 2015-03-04: qty 20.3

## 2015-03-04 MED ORDER — GLYCOPYRROLATE 0.2 MG/ML IJ SOLN
INTRAMUSCULAR | Status: AC
  Filled 2015-03-04: qty 2

## 2015-03-04 MED ORDER — LACTATED RINGERS IV SOLN
INTRAVENOUS | Status: DC
  Administered 2015-03-04 (×2): via INTRAVENOUS

## 2015-03-04 MED ORDER — ONDANSETRON HCL 4 MG/2ML IJ SOLN: 4.0000 mg | Freq: Once | INTRAMUSCULAR | Status: DC | PRN

## 2015-03-04 MED ORDER — ACETAMINOPHEN 325 MG PO TABS: 325.0000 mg | ORAL_TABLET | ORAL | Status: DC | PRN

## 2015-03-04 MED ORDER — BUPIVACAINE HCL (PF) 0.25 % IJ SOLN
INTRAMUSCULAR | Status: AC
  Filled 2015-03-04: qty 30

## 2015-03-04 MED ORDER — CEFTRIAXONE SODIUM IN DEXTROSE 20 MG/ML IV SOLN
1.0000 g | INTRAVENOUS | Status: DC
  Administered 2015-03-05 – 2015-03-06 (×2): 1 g via INTRAVENOUS
  Filled 2015-03-04 (×4): qty 50

## 2015-03-04 MED ORDER — CLONAZEPAM 0.125 MG PO TBDP
0.2500 mg | ORAL_TABLET | Freq: Two times a day (BID) | ORAL | Status: DC | PRN
  Administered 2015-03-05 – 2015-03-07 (×4): 0.25 mg via ORAL
  Filled 2015-03-04 (×5): qty 2

## 2015-03-04 MED ORDER — BUPIVACAINE-EPINEPHRINE 0.5% -1:200000 IJ SOLN
INTRAMUSCULAR | Status: DC | PRN
  Administered 2015-03-04: 20 mL

## 2015-03-04 MED ORDER — PROPOFOL 10 MG/ML IV BOLUS
INTRAVENOUS | Status: DC | PRN
  Administered 2015-03-04: 60 mg via INTRAVENOUS

## 2015-03-04 MED ORDER — FENTANYL CITRATE 0.05 MG/ML IJ SOLN
INTRAMUSCULAR | Status: AC
  Filled 2015-03-04: qty 5

## 2015-03-04 MED ORDER — ONDANSETRON HCL 4 MG/2ML IJ SOLN
INTRAMUSCULAR | Status: DC | PRN
  Administered 2015-03-04: 4 mg via INTRAVENOUS

## 2015-03-04 MED ORDER — LIDOCAINE HCL (CARDIAC) 20 MG/ML IV SOLN
INTRAVENOUS | Status: DC | PRN
  Administered 2015-03-04: 30 mg via INTRAVENOUS

## 2015-03-04 MED ORDER — FENTANYL CITRATE 0.05 MG/ML IJ SOLN
25.0000 ug | INTRAMUSCULAR | Status: DC | PRN
  Administered 2015-03-04 (×2): 25 ug via INTRAVENOUS

## 2015-03-04 MED ORDER — PHENYLEPHRINE HCL 10 MG/ML IJ SOLN
10.0000 mg | INTRAVENOUS | Status: DC | PRN
  Administered 2015-03-04: 10 ug/min via INTRAVENOUS

## 2015-03-04 MED ORDER — METOCLOPRAMIDE HCL 5 MG/ML IJ SOLN: 5.0000 mg | Freq: Three times a day (TID) | INTRAMUSCULAR | Status: DC | PRN

## 2015-03-04 MED ORDER — DOCUSATE SODIUM 100 MG PO CAPS: 100.0000 mg | ORAL_CAPSULE | Freq: Two times a day (BID) | ORAL | Status: DC

## 2015-03-04 MED ORDER — ONDANSETRON HCL 4 MG/2ML IJ SOLN: 4.0000 mg | Freq: Four times a day (QID) | INTRAMUSCULAR | Status: DC | PRN

## 2015-03-04 MED ORDER — ACETAMINOPHEN 325 MG PO TABS: 650.0000 mg | ORAL_TABLET | Freq: Four times a day (QID) | ORAL | Status: DC | PRN

## 2015-03-04 MED ORDER — POTASSIUM CHLORIDE IN NACL 20-0.45 MEQ/L-% IV SOLN
INTRAVENOUS | Status: DC
  Administered 2015-03-04 – 2015-03-05 (×2): via INTRAVENOUS
  Filled 2015-03-04 (×3): qty 1000

## 2015-03-04 MED ORDER — MORPHINE SULFATE 2 MG/ML IJ SOLN
2.0000 mg | Freq: Once | INTRAMUSCULAR | Status: AC
  Administered 2015-03-04: 2 mg via INTRAVENOUS
  Filled 2015-03-04: qty 1

## 2015-03-04 MED ORDER — ONDANSETRON HCL 4 MG PO TABS: 4.0000 mg | ORAL_TABLET | Freq: Four times a day (QID) | ORAL | Status: DC | PRN

## 2015-03-04 SURGICAL SUPPLY — 43 items
BNDG COHESIVE 4X5 TAN STRL (GAUZE/BANDAGES/DRESSINGS) ×2 IMPLANT
CANISTER SUCTION 2500CC (MISCELLANEOUS) ×2 IMPLANT
COVER MAYO STAND STRL (DRAPES) ×2 IMPLANT
COVER PERINEAL POST (MISCELLANEOUS) ×2 IMPLANT
COVER SURGICAL LIGHT HANDLE (MISCELLANEOUS) ×2 IMPLANT
DRAPE C-ARM 42X72 X-RAY (DRAPES) ×2 IMPLANT
DRAPE STERI IOBAN 125X83 (DRAPES) ×2 IMPLANT
DRILL BIT CAN 2.0 LONG (BIT) ×2 IMPLANT
DRSG PAD ABDOMINAL 8X10 ST (GAUZE/BANDAGES/DRESSINGS) ×2 IMPLANT
DURAPREP 26ML APPLICATOR (WOUND CARE) ×2 IMPLANT
ELECT REM PT RETURN 9FT ADLT (ELECTROSURGICAL) ×2
ELECTRODE REM PT RTRN 9FT ADLT (ELECTROSURGICAL) ×1 IMPLANT
EVACUATOR 1/8 PVC DRAIN (DRAIN) IMPLANT
GAUZE SPONGE 4X4 12PLY STRL (GAUZE/BANDAGES/DRESSINGS) ×2 IMPLANT
GAUZE XEROFORM 1X8 LF (GAUZE/BANDAGES/DRESSINGS) ×2 IMPLANT
GLOVE BIOGEL PI IND STRL 8 (GLOVE) ×2 IMPLANT
GLOVE BIOGEL PI INDICATOR 8 (GLOVE) ×2
GLOVE ORTHO TXT STRL SZ7.5 (GLOVE) ×4 IMPLANT
GOWN STRL REUS W/ TWL LRG LVL3 (GOWN DISPOSABLE) ×1 IMPLANT
GOWN STRL REUS W/TWL 2XL LVL3 (GOWN DISPOSABLE) ×2 IMPLANT
GOWN STRL REUS W/TWL LRG LVL3 (GOWN DISPOSABLE) ×4 IMPLANT
HIP FRAC NAIL LAG SCR 10.5X100 (Orthopedic Implant) ×1 IMPLANT
KIT BASIN OR (CUSTOM PROCEDURE TRAY) ×2 IMPLANT
KIT ROOM TURNOVER OR (KITS) ×2 IMPLANT
LINER BOOT UNIVERSAL DISP (MISCELLANEOUS) ×2 IMPLANT
MANIFOLD NEPTUNE II (INSTRUMENTS) ×2 IMPLANT
NAIL HIP FRACT 130D 11X180 (Screw) ×2 IMPLANT
NEEDLE HYPO 25GX1X1/2 BEV (NEEDLE) ×2 IMPLANT
NS IRRIG 1000ML POUR BTL (IV SOLUTION) ×2 IMPLANT
PACK GENERAL/GYN (CUSTOM PROCEDURE TRAY) ×2 IMPLANT
PAD ARMBOARD 7.5X6 YLW CONV (MISCELLANEOUS) ×4 IMPLANT
SCREW BONE CORTICAL 5.0X38 (Screw) ×2 IMPLANT
SCREW CANN THRD AFF 10.5X100 (Orthopedic Implant) ×1 IMPLANT
SPONGE GAUZE 4X4 12PLY STER LF (GAUZE/BANDAGES/DRESSINGS) ×2 IMPLANT
STAPLER VISISTAT 35W (STAPLE) ×4 IMPLANT
SUT VIC AB 0 CT1 27 (SUTURE) ×1
SUT VIC AB 0 CT1 27XBRD ANBCTR (SUTURE) ×1 IMPLANT
SUT VIC AB 2-0 CT1 27 (SUTURE) ×4
SUT VIC AB 2-0 CT1 TAPERPNT 27 (SUTURE) ×2 IMPLANT
SYR CONTROL 10ML LL (SYRINGE) ×2 IMPLANT
TAPE CLOTH SURG 4X10 WHT LF (GAUZE/BANDAGES/DRESSINGS) ×2 IMPLANT
WATER STERILE IRR 1000ML POUR (IV SOLUTION) ×2 IMPLANT
distal graduated drill long ×2 IMPLANT

## 2015-03-04 NOTE — Transfer of Care (Signed)
Immediate Anesthesia Transfer of Care Note  Patient: Andrea LazierGeraldine [name]  Procedure(s) Performed: Procedure(s): INTRAMEDULLARY (IM) NAIL INTERTROCHANTRIC (Right)  Patient Location: PACU  Anesthesia Type:General  Level of Consciousness: awake, sedated and patient cooperative  Airway & Oxygen Therapy: Patient Spontanous Breathing and Patient connected to face mask oxygen  Post-op Assessment: Report given to RN, Post -op Vital signs reviewed and stable and Patient moving all extremities  Post vital signs: Reviewed and stable  Last Vitals:  Filed Vitals:   03/04/15 1730  BP: 146/79  Pulse: 69  Temp:   Resp:     Complications: No apparent anesthesia complications

## 2015-03-04 NOTE — Progress Notes (Addendum)
[facility] PROGRESS NOTE  Andrea [name] ZOX:[id number] DOB: 1919/01/30 DOA: 03/03/2015 PCP: Thayer Headings, MD  Assessment/Plan: Active Problems:   Essential hypertension   Dementia without behavioral disturbance   CKD (chronic kidney disease), stage III   Right hip pain   Hip fracture   Protein-calorie malnutrition, severe      Right hip pain/Hip fracture - Hip fracture order set placed - Ortho consulted by ED physician Right IT hip fx for surgery today  COPD/hypoxemic last night Stable on 2 L Chest x-ray negative   Essential hypertension Stable at this juncture we'll continue beta blocker - Hydralazine when necessary elevated blood pressure   Dementia without behavioral disturbance - Stable continue home regimen Swallow evaluation    CKD (chronic kidney disease), stage III - We'll hold ARB - Stable kidney function  UTI We'll start the patient on Rocephin 1 g daily 5 days Follow urine culture  Code Status: DO NOT RESUSCITATE Family Communication: family updated about patient's clinical progress Disposition Plan:  As above    Brief narrative: Andrea [name] is a 81 y.o. female with history of dementia, hypertension, and chronic kidney disease stage III Presenting to the hospital after mechanical fall landed on right side. Developed pain at right side subsequently. As a result patient was brought to the ED for further evaluation and recommendations. While in the ED patient has had workup which revealed after CT scan of hip a displaced comminuted fracture of the right femoral lesser trochanter and a nondisplaced fracture of the greater trochanter. Currently suspicion is for occult intertrochanteric fracture. Patient also had CT scan of head given history of fall and there is no acute intracranial abnormality reported   Consultants:  Orthopedics  Procedures:  None  Antibiotics: Cefazolin/ceftriaxone  HPI/Subjective: c/o severe pain in  rt hip , improved this morning  Objective: Filed Vitals:   03/03/15 2139 03/04/15 0350 03/04/15 0403 03/04/15 0534  BP: 136/111  138/90 132/59  Pulse: 74  65   Temp: 98.3 F (36.8 C)   99.2 F (37.3 C)  TempSrc: Oral   Axillary  Resp: 19   20  Height:      Weight:      SpO2: 100% 86% 100% 100%   No intake or output data in the 24 hours ending 03/04/15 1224  Exam:  General: No acute respiratory distress Lungs: Clear to auscultation bilaterally without wheezes or crackles Cardiovascular: Regular rate and rhythm without murmur gallop or rub normal S1 and S2 Abdomen: Nontender, nondistended, soft, bowel sounds positive, no rebound, no ascites, no appreciable mass Extremities: Pain with right hip ROM      Data Reviewed: Basic Metabolic Panel:  Recent Labs Lab 02/26/15 1658 03/03/15 1400 03/04/15 0405  NA 135 139 137  K 4.7 5.0 4.6  CL 99 102 103  CO2 GLUCOSE 91 95 117*  BUN 26* 26* 29*  CREATININE 1.40* 1.34* 1.43*  CALCIUM 9.8 10.3 9.5    Liver Function Tests: No results for input(s): AST, ALT, ALKPHOS, BILITOT, PROT, ALBUMIN in the last 168 hours. No results for input(s): LIPASE, AMYLASE in the last 168 hours. No results for input(s): AMMONIA in the last 168 hours.  CBC:  Recent Labs Lab 03/03/15 1400 03/04/15 0405  WBC 16.4* 12.7*  NEUTROABS 11.0*  --   HGB 13.1 11.5*  HCT 40.0 35.3*  MCV 93.9 95.9  PLT 293 251    Cardiac Enzymes: No results for input(s): CKTOTAL, CKMB, CKMBINDEX, TROPONINI in the last 168  hours. BNP (last 3 results) No results for input(s): BNP in the last 8760 hours.  ProBNP (last 3 results)  Recent Labs  09/12/14 0102 10/01/14 0938  PROBNP 9765.0* 1346.0*      CBG: No results for input(s): GLUCAP in the last 168 hours.  No results found for this or any previous visit (from the past 240 hour(s)).   Studies: Dg Chest 1 View  03/04/2015   CLINICAL DATA:  81 year old female with acute respiratory failure.  Initial encounter.  EXAM: CHEST  1 VIEW  COMPARISON:  03/03/2015 and earlier.  FINDINGS: Portable AP semi upright view at 0815 hours. Large lung volumes. Mostly chronic appearing bilateral rib fractures (right ninth fracture might be subacute). No pneumothorax. Allowing for portable technique, the lungs are clear. Stable cardiac size and mediastinal contours. Thoracolumbar junction compression fracture again noted.  IMPRESSION: Hyperinflation with no acute cardiopulmonary abnormality identified.   Electronically Signed   By: Odessa Fleming M.D.   On: 03/04/2015 08:21   Dg Chest 1 View  03/03/2015   CLINICAL DATA:  Fall.  Preop.  No chest complaints.  EXAM: CHEST  1 VIEW  COMPARISON:  10/21/2014  FINDINGS: There is hyperinflation of the lungs compatible with COPD. Heart is upper limits normal in size. Tortuosity of the thoracic aorta with scattered calcifications. Lungs are clear. No effusions. Multiple old healed left rib fractures.  IMPRESSION: Hyperinflation/COPD.  No active disease.   Electronically Signed   By: Charlett Nose M.D.   On: 03/03/2015 13:45   Ct Head Wo Contrast  03/03/2015   CLINICAL DATA:  Head injury after fall.  No loss of consciousness.  EXAM: CT HEAD WITHOUT CONTRAST  CT CERVICAL SPINE WITHOUT CONTRAST  TECHNIQUE: Multidetector CT imaging of the head and cervical spine was performed following the standard protocol without intravenous contrast. Multiplanar CT image reconstructions of the cervical spine were also generated.  COMPARISON:  CT scan of August 25, 2014.  FINDINGS: CT HEAD FINDINGS  Bony calvarium appears intact. Moderate diffuse cortical atrophy is noted. Mild chronic ischemic white matter disease is noted. No mass effect or midline shift is noted. Ventricular size is within normal limits. There is no evidence of mass lesion, hemorrhage or acute infarction.  CT CERVICAL SPINE FINDINGS  No fracture or significant spondylolisthesis is noted. Moderate degenerative disc disease is noted at  C5-6 and C6-7 with anterior osteophyte formation. Visualized lung apices appear normal. Degenerative changes seen involving the posterior facet joints of throughout the cervical spine bilaterally.  IMPRESSION: Moderate diffuse cortical atrophy. Moderate chronic ischemic white matter disease. No acute intracranial abnormality seen.  Multilevel degenerative disc disease is noted. No acute abnormality seen in the cervical spine.   Electronically Signed   By: Lupita Raider, M.D.   On: 03/03/2015 13:53   Ct Cervical Spine Wo Contrast  03/03/2015   CLINICAL DATA:  Head injury after fall.  No loss of consciousness.  EXAM: CT HEAD WITHOUT CONTRAST  CT CERVICAL SPINE WITHOUT CONTRAST  TECHNIQUE: Multidetector CT imaging of the head and cervical spine was performed following the standard protocol without intravenous contrast. Multiplanar CT image reconstructions of the cervical spine were also generated.  COMPARISON:  CT scan of August 25, 2014.  FINDINGS: CT HEAD FINDINGS  Bony calvarium appears intact. Moderate diffuse cortical atrophy is noted. Mild chronic ischemic white matter disease is noted. No mass effect or midline shift is noted. Ventricular size is within normal limits. There is no evidence of mass lesion, hemorrhage  or acute infarction.  CT CERVICAL SPINE FINDINGS  No fracture or significant spondylolisthesis is noted. Moderate degenerative disc disease is noted at C5-6 and C6-7 with anterior osteophyte formation. Visualized lung apices appear normal. Degenerative changes seen involving the posterior facet joints of throughout the cervical spine bilaterally.  IMPRESSION: Moderate diffuse cortical atrophy. Moderate chronic ischemic white matter disease. No acute intracranial abnormality seen.  Multilevel degenerative disc disease is noted. No acute abnormality seen in the cervical spine.   Electronically Signed   By: Lupita RaiderJames  Green Jr, M.D.   On: 03/03/2015 13:53   Ct Hip Right Wo Contrast  03/03/2015    CLINICAL DATA:  81 year old with right hip pain after falling. Evaluate for fracture. History of chronic kidney disease, hypertension and Alzheimer's. Initial encounter.  EXAM: CT OF THE RIGHT HIP WITHOUT CONTRAST  TECHNIQUE: Multidetector CT imaging of the right hip was performed according to the standard protocol. Multiplanar CT image reconstructions were also generated.  COMPARISON:  Radiograph same date.  FINDINGS: Examination is limited to right hip and right hemipelvis. The bones are moderately demineralized.  As demonstrated radiographically, there is a displaced comminuted fracture of the right femoral lesser trochanter. This is consistent with an avulsion fracture, and the iliopsoas tendon inserts on the avulsed fragment. There is a nondisplaced fracture of the greater trochanter, best seen on the coronal images. No definite displaced intertrochanteric or basicervical fracture identified.  Moderate right hip degenerative changes are present. There is a chondroid lesion inferiorly in the right femoral head which demonstrates no aggressive characteristics. Degenerative changes are also present at the symphysis pubis and right sacroiliac joint. On the sagittal images, there are degenerative changes in the lower coccyx.  There is subcutaneous edema surrounding the right hip without large soft tissue hematoma or joint effusion.  Incidentally noted is a 4.3 cm lipoma dependently within the cecum, possibly related to the ileocecal valve.  IMPRESSION: 1. CT confirms the presence of a displaced comminuted fracture of the right femoral lesser trochanter. In addition, there is a nondisplaced fracture of the greater trochanter. 2. Given these 2 fractures, there is at least moderate probability of an occult intertrochanteric fracture, not demonstrated by this examination. 3. Degenerative changes as described.   Electronically Signed   By: Carey BullocksWilliam  Veazey M.D.   On: 03/03/2015 15:19   Dg Hip Unilat With Pelvis 2-3  Views Right  03/03/2015   CLINICAL DATA:  Acute right proximal femur pain after fall. Initial encounter.  EXAM: RIGHT HIP (WITH PELVIS) 2-3 VIEWS  COMPARISON:  None.  FINDINGS: Severely displaced fracture of lesser trochanter of proximal right femur is noted. Severe degenerative joint disease of right hip is noted. Mild degenerative joint disease of left hip is noted.  IMPRESSION: Severely displaced avulsion fracture of lesser trochanter of proximal right femur is noted.   Electronically Signed   By: Lupita RaiderJames  Green Jr, M.D.   On: 03/03/2015 13:44   Dg Femur, Min 2 Views Right  03/03/2015   CLINICAL DATA:  Pain following fall  EXAM: RIGHT FEMUR 2 VIEWS  COMPARISON:  None.  FINDINGS: Frontal and lateral views were obtained. There is avulsion of the lesser trochanter. No other fracture demonstrated. No dislocation. There is moderate narrowing of the hip and knee joints on the right. No knee joint effusion.  IMPRESSION: Avulsion of the lesser trochanter. No dislocation. Osteoarthritic change in the right hip and knee joints.   Electronically Signed   By: Bretta BangWilliam  Woodruff III M.D.   On: 03/03/2015  13:45    Scheduled Meds: .  ceFAZolin (ANCEF) IV  2 g Intravenous Once  . escitalopram  20 mg Oral Daily  . heparin  5,000 Units Subcutaneous 3 times per day  . senna-docusate  1 tablet Oral QHS   Continuous Infusions: . sodium chloride    . dextrose 5 % and 0.45 % NaCl with KCl 20 mEq/L 75 mL/hr at 03/04/15 1024    Active Problems:   Essential hypertension   Dementia without behavioral disturbance   CKD (chronic kidney disease), stage III   Right hip pain   Hip fracture   Protein-calorie malnutrition, severe    Time spent: 40 minutes   [facility]  [facility] Pager [phone]. If 7PM-7AM, please contact night-coverage at [email or web address], password [facility] 03/04/2015, 12:24 PM  LOS: 1 day

## 2015-03-04 NOTE — Progress Notes (Signed)
INITIAL NUTRITION ASSESSMENT  DOCUMENTATION CODES Per approved criteria  -Severe malnutrition in the context of chronic illness   Pt meets criteria for severe MALNUTRITION in the context of chronic illness as evidenced by severe fat and muscle depletion, 16.7% wt loss x 6 months.  INTERVENTION: -RD to follow for diet advancement -Supplement diet as appropriate  NUTRITION DIAGNOSIS: Malnutrition related to inadequate oral intake as evidenced by severe fat and muscle depletion, 16.7% wt loss x 6 months.   Goal: Pt will meet >90% of estimated nutritional needs  Monitor:  Diet advancement, PO/supplement intake, labs, weight changes, I/O's  Reason for Assessment: Consult (Hip Fx Protocol)  81 y.o. female  Admitting Dx: <principal problem not specified>  Andrea [name] is a 81 y.o. female with history of dementia, hypertension, and chronic kidney disease stage III Presenting to the hospital after mechanical fall landed on right side. Developed pain at right side subsequently. As a result patient was brought to the ED for further evaluation and recommendations. While in the ED patient has had workup which revealed after CT scan of hip a displaced comminuted fracture of the right femoral lesser trochanter and a nondisplaced fracture of the greater trochanter. Currently suspicion is for occult intertrochanteric fracture. Patient also had CT scan of head given history of fall and there is no acute intracranial abnormality reported  ASSESSMENT: Pt admitted with rt hip fx s/p fall.  Spoke with RN who reports pt is NPO due to dysphagia. Pt was "choking on everything" this morning, including water and medications. Pt is awaiting SLP evaluation.  Pt confirms poor appetite and wt loss PTA, but unable to elaborate further. Wt hx reveals a 16.7% wt loss x 6 months, which is clinically significant. She reports her clothes have been feeling looser, but does not know why she is losing weight. Pt  confirms difficulty swallowing, but reports this is not a new problems for her. She reports she is on a regular diet at home, however, unable to confirm diet hx at this time.  Labs reviewed. BUN/Creat: 29/1.43, Glucose: 117.  Nutrition Focused Physical Exam:  Subcutaneous Fat:  Orbital Region: severe depletion Upper Arm Region: severe depletion Thoracic and Lumbar Region: severe depletion  Muscle:  Temple Region: severe depletion Clavicle Bone Region: severe depletion Clavicle and Acromion Bone Region: severe depletion Scapular Bone Region: severe depletion Dorsal Hand: severe depletion Patellar Region: severe depletion Anterior Thigh Region: severe depletion Posterior Calf Region: severe depletion  Edema: none present  Height: Ht Readings from Last 1 Encounters:  03/03/15 5\' 6"  (1.676 m)    Weight: Wt Readings from Last 1 Encounters:  03/03/15 85 lb (38.556 kg)    Ideal Body Weight: 130#  % Ideal Body Weight: 65%  Wt Readings from Last 10 Encounters:  03/03/15 85 lb (38.556 kg)  02/26/15 85 lb 6.4 oz (38.737 kg)  09/14/14 108 lb (48.988 kg)  08/28/14 102 lb 14.4 oz (46.675 kg)    Usual Body Weight: 102#  % Usual Body Weight: 83%  BMI:  Body mass index is 13.73 kg/(m^2). Underweight  Estimated Nutritional Needs: Kcal: 1400-1600 Protein: 50-60 grams Fluid: 1.4-1.6 L  Skin: Intact  Diet Order:  NPO  EDUCATION NEEDS: -Education not appropriate at this time  No intake or output data in the 24 hours ending 03/04/15 0957  Last BM: PTA  Labs:   Recent Labs Lab 02/26/15 1658 03/03/15 1400 03/04/15 0405  NA 135 139 137  K 4.7 5.0 4.6  CL 99 102 103  CO2 BUN 26* 26* 29*  CREATININE 1.40* 1.34* 1.43*  CALCIUM 9.8 10.3 9.5  GLUCOSE 91 95 117*    CBG (last 3)  No results for input(s): GLUCAP in the last 72 hours.  Scheduled Meds: .  ceFAZolin (ANCEF) IV  2 g Intravenous Once  . escitalopram  20 mg Oral Daily  . heparin  5,000 Units  Subcutaneous 3 times per day  . metoprolol tartrate  25 mg Oral BID  . senna-docusate  1 tablet Oral QHS    Continuous Infusions: . sodium chloride    . dextrose 5 % and 0.45 % NaCl with KCl 20 mEq/L 75 mL/hr at 03/03/15 2131    Past Medical History  Diagnosis Date  . COPD (chronic obstructive pulmonary disease)   . Alzheimer's dementia   . Depression   . Chronic kidney disease   . Hypertension     History reviewed. No pertinent past surgical history.  Crisanto Nied A. Mayford Knife, RD, LDN, CDE Pager: [phone] After hours Pager: [phone]

## 2015-03-04 NOTE — Brief Op Note (Signed)
03/03/2015 - 03/04/2015  5:00 PM  PATIENT:  Andrea LazierGeraldine Zavadil  81 y.o. female  PRE-OPERATIVE DIAGNOSIS:  RIGHT HIP FRACTURE  POST-OPERATIVE DIAGNOSIS:  * No post-op diagnosis entered *  PROCEDURE:  Procedure(s): INTRAMEDULLARY (IM) NAIL INTERTROCHANTRIC (Right)  SURGEON:  Surgeon(s) and Role:    * Eldred MangesMark C Yates, MD - Primary  PHYSICIAN ASSISTANT: Mavric Cortright m. Barry Dieneswens pa-c  ANESTHESIA:   general  EBL:     BLOOD ADMINISTERED:none  DRAINS: none    SPECIMEN:  No Specimen  DISPOSITION OF SPECIMEN:  N/A  COUNTS:  YES  TOURNIQUET:  * No tourniquets in log *    PATIENT DISPOSITION:  PACU - hemodynamically stable.

## 2015-03-04 NOTE — Anesthesia Procedure Notes (Signed)
Procedure Name: Intubation Date/Time: 03/04/2015 4:19 PM Performed by: Coralee RudFLORES, Andrea Burkitt Pre-anesthesia Checklist: Patient identified, Emergency Drugs available, Suction available and Patient being monitored Patient Re-evaluated:Patient Re-evaluated prior to inductionOxygen Delivery Method: Circle system utilized Preoxygenation: Pre-oxygenation with 100% oxygen Intubation Type: IV induction Ventilation: Mask ventilation without difficulty Laryngoscope Size: Miller and 3 Grade View: Grade I Tube type: Oral Tube size: 7.5 mm Number of attempts: 1 Airway Equipment and Method: Stylet Placement Confirmation: ETT inserted through vocal cords under direct vision,  positive ETCO2 and breath sounds checked- equal and bilateral Secured at: 21 cm Tube secured with: Tape Dental Injury: Teeth and Oropharynx as per pre-operative assessment

## 2015-03-04 NOTE — Anesthesia Postprocedure Evaluation (Signed)
  Anesthesia Post-op Note  Patient: Andrea LazierGeraldine [name]  Procedure(s) Performed: Procedure(s): INTRAMEDULLARY (IM) NAIL INTERTROCHANTRIC (Right)  Patient Location: PACU  Anesthesia Type:General  Level of Consciousness: awake and confused  Airway and Oxygen Therapy: Patient Spontanous Breathing and Patient connected to nasal cannula oxygen  Post-op Pain: mild  Post-op Assessment: Post-op Vital signs reviewed, Patient's Cardiovascular Status Stable, Respiratory Function Stable, Patent Airway and Pain level controlled  Post-op Vital Signs: stable  Last Vitals:  Filed Vitals:   03/04/15 1845  BP:   Pulse:   Temp: 36.6 C  Resp:     Complications: No apparent anesthesia complications

## 2015-03-04 NOTE — Anesthesia Preprocedure Evaluation (Addendum)
Anesthesia Evaluation  Patient identified by MRN, date of birth, ID band Patient confused    Reviewed: Allergy & Precautions, NPO status , Patient's Chart, lab work & pertinent test results, Unable to perform ROS - Chart review only  Airway Mallampati: II  TM Distance: >3 FB     Dental  (+) Teeth Intact   Pulmonary  breath sounds clear to auscultation        Cardiovascular hypertension, Rhythm:Regular Rate:Normal     Neuro/Psych    GI/Hepatic   Endo/Other    Renal/GU      Musculoskeletal   Abdominal   Peds  Hematology   Anesthesia Other Findings   Reproductive/Obstetrics                            Anesthesia Physical Anesthesia Plan  ASA: III  Anesthesia Plan: General   Post-op Pain Management:    Induction: Intravenous  Airway Management Planned: Oral ETT  Additional Equipment:   Intra-op Plan:   Post-operative Plan:   Informed Consent:   Plan Discussed with: CRNA and Anesthesiologist  Anesthesia Plan Comments:         Anesthesia Quick Evaluation

## 2015-03-04 NOTE — Progress Notes (Signed)
Pt arrived from [facility] via transport with anxiety noted. Pt asking to see doctor and daughter. Raymon MuttonLisa Read, daughter and POA called but no answer, message left on cell phone and with husband regarding her mom's request and anxiety. Per report, this has been pt's baseline today along with intermittent confusion. Pt refuses to answer orientation questions. This nurse attempted larger bore IV insertion but pt flailing arms. 22G flushes easily with quick blood return. LR infusing without difficulty. Warm blankets placed on pt. Pt continues to Boeingmoan quieity but denies pain. Dr. Noreene LarssonJoslin in to see pt. No new orders received.

## 2015-03-04 NOTE — Interval H&P Note (Signed)
History and Physical Interval Note:  03/04/2015 3:24 PM  Andrea [name]  has presented today for surgery, with the diagnosis of RIGHT HIP FRACTURE  The various methods of treatment have been discussed with the patient and family. After consideration of risks, benefits and other options for treatment, the patient has consented to  Procedure(s): INTRAMEDULLARY (IM) NAIL INTERTROCHANTRIC (Right) as a surgical intervention .  The patient's history has been reviewed, patient examined, no change in status, stable for surgery.  I have reviewed the patient's chart and labs.  Questions were answered to the patient's satisfaction.     Obe Andrea [name]

## 2015-03-04 NOTE — Brief Op Note (Signed)
03/03/2015 - 03/04/2015  5:20 PM  PATIENT:  Jaci LazierGeraldine Devito  81 y.o. female  PRE-OPERATIVE DIAGNOSIS:  RIGHT HIP FRACTURE  POST-OPERATIVE DIAGNOSIS:  RIGHT HIP FRACTURE  PROCEDURE:  Procedure(s): INTRAMEDULLARY (IM) NAIL INTERTROCHANTRIC (Right)  SURGEON:  Surgeon(s) and Role:    * Eldred MangesMark C Nimai Burbach, MD - Primary  PHYSICIAN ASSISTANT:   ASSISTANTS: Zonia KiefJames Owens PA-C   ANESTHESIA:   local and general  EBL:  minimal  BLOOD ADMINISTERED:none  DRAINS: none   LOCAL MEDICATIONS USED:  MARCAINE     SPECIMEN:  No Specimen  DISPOSITION OF SPECIMEN:  N/A  COUNTS:  YES  TOURNIQUET:  * No tourniquets in log *  DICTATION: .Note written in EPIC  PLAN OF CARE: already inpatient  PATIENT DISPOSITION:  PACU - hemodynamically stable.   Delay start of Pharmacological VTE agent (>24hrs) due to surgical blood loss or risk of bleeding: yes

## 2015-03-04 NOTE — Progress Notes (Signed)
[id number] Patient awake c/o severe pain in rt hip morphine 0.5mg  given IV. 0346 Patient crying still c/o of severe pain in hip. Patient was given vicodin one tablet. Patient got strangle on the tablet but was able to swallow it. Patient did get strangle on a sip of water after it. Patient place on continuous pulse ox per order. Patient O2 sat 86% on RA. Patient started on O2 at 2l/m nasal cannula. O2 sats range from 88% to 100% on 2l/m.0450 Patient crying in pain Raina Minaatherine Schorr notified and order receive. 0500 Went to give patient morphine IV patient stated she could not breathe. O2 sats range from 86%to 91% Patient pull up in bed and stated that did not help. Raina Minaatherine Schorr NP notified. Respiratory treatment given and morphine 2mg  given IV. Patient was sats 100% on RA after respiratory treatment. 0630 Patient is resting will continue to monitor.

## 2015-03-04 NOTE — H&P (View-Only) (Signed)
Reason for Consult:fall with hip fracture Referring Physician: Dr. Wynonia Lawman [name] is an 81 y.o. female.  HPI: 81 yo with Hx of falls. Ambulates with assistance , uses a walker and sometimes walks without walker.  Right IT hip Fx  Past Medical History  Diagnosis Date  . COPD (chronic obstructive pulmonary disease)   . Alzheimer's dementia   . Depression   . Chronic kidney disease   . Hypertension     History reviewed. No pertinent past surgical history.  Family History  Problem Relation Age of Onset  . Suicidality Other     Social History:  reports that she has never smoked. She does not have any smokeless tobacco history on file. She reports that she does not drink alcohol or use illicit drugs.  Allergies:  Allergies  Allergen Reactions  . Aspirin Other (See Comments)    Stomach upset    Medications: I have reviewed the patient's current medications.  Results for orders placed or performed during the hospital encounter of 03/03/15 (from the past 48 hour(s))  CBC with Differential/Platelet     Status: Abnormal   Collection Time: 03/03/15  2:00 PM  Result Value Ref Range   WBC 16.4 (H) 4.0 - 10.5 K/uL   RBC 4.26 3.87 - 5.11 MIL/uL   Hemoglobin 13.1 12.0 - 15.0 g/dL   HCT 40.0 36.0 - 46.0 %   MCV 93.9 78.0 - 100.0 fL   MCH 30.8 26.0 - 34.0 pg   MCHC 32.8 30.0 - 36.0 g/dL   RDW 13.5 11.5 - 15.5 %   Platelets 293 150 - 400 K/uL   Neutrophils Relative % 67 43 - 77 %   Neutro Abs 11.0 (H) 1.7 - 7.7 K/uL   Lymphocytes Relative 27 12 - 46 %   Lymphs Abs 4.4 (H) 0.7 - 4.0 K/uL   Monocytes Relative 6 3 - 12 %   Monocytes Absolute 1.0 0.1 - 1.0 K/uL   Eosinophils Relative 0 0 - 5 %   Eosinophils Absolute 0.0 0.0 - 0.7 K/uL   Basophils Relative 0 0 - 1 %   Basophils Absolute 0.0 0.0 - 0.1 K/uL  Basic metabolic panel     Status: Abnormal   Collection Time: 03/03/15  2:00 PM  Result Value Ref Range   Sodium 139 135 - 145 mmol/L   Potassium 5.0 3.5 - 5.1 mmol/L    Chloride 102 96 - 112 mmol/L   CO2 23 19 - 32 mmol/L   Glucose, Bld 95 70 - 99 mg/dL   BUN 26 (H) 6 - 23 mg/dL   Creatinine, Ser 1.34 (H) 0.50 - 1.10 mg/dL   Calcium 10.3 8.4 - 10.5 mg/dL   GFR calc non Af Amer 33 (L) >90 mL/min   GFR calc Af Amer 38 (L) >90 mL/min    Comment: (NOTE) The eGFR has been calculated using the CKD EPI equation. This calculation has not been validated in all clinical situations. eGFR's persistently <90 mL/min signify possible Chronic Kidney Disease.    Anion gap 14 5 - 15  Protime-INR     Status: None   Collection Time: 03/03/15  2:00 PM  Result Value Ref Range   Prothrombin Time 14.8 11.6 - 15.2 seconds   INR 1.14 0.00 - 1.49    Dg Chest 1 View  03/03/2015   CLINICAL DATA:  Fall.  Preop.  No chest complaints.  EXAM: CHEST  1 VIEW  COMPARISON:  10/21/2014  FINDINGS: There is hyperinflation  of the lungs compatible with COPD. Heart is upper limits normal in size. Tortuosity of the thoracic aorta with scattered calcifications. Lungs are clear. No effusions. Multiple old healed left rib fractures.  IMPRESSION: Hyperinflation/COPD.  No active disease.   Electronically Signed   By: Rolm Baptise M.D.   On: 03/03/2015 13:45   Ct Head Wo Contrast  03/03/2015   CLINICAL DATA:  Head injury after fall.  No loss of consciousness.  EXAM: CT HEAD WITHOUT CONTRAST  CT CERVICAL SPINE WITHOUT CONTRAST  TECHNIQUE: Multidetector CT imaging of the head and cervical spine was performed following the standard protocol without intravenous contrast. Multiplanar CT image reconstructions of the cervical spine were also generated.  COMPARISON:  CT scan of August 25, 2014.  FINDINGS: CT HEAD FINDINGS  Bony calvarium appears intact. Moderate diffuse cortical atrophy is noted. Mild chronic ischemic white matter disease is noted. No mass effect or midline shift is noted. Ventricular size is within normal limits. There is no evidence of mass lesion, hemorrhage or acute infarction.  CT  CERVICAL SPINE FINDINGS  No fracture or significant spondylolisthesis is noted. Moderate degenerative disc disease is noted at C5-6 and C6-7 with anterior osteophyte formation. Visualized lung apices appear normal. Degenerative changes seen involving the posterior facet joints of throughout the cervical spine bilaterally.  IMPRESSION: Moderate diffuse cortical atrophy. Moderate chronic ischemic white matter disease. No acute intracranial abnormality seen.  Multilevel degenerative disc disease is noted. No acute abnormality seen in the cervical spine.   Electronically Signed   By: Marijo Conception, M.D.   On: 03/03/2015 13:53   Ct Cervical Spine Wo Contrast  03/03/2015   CLINICAL DATA:  Head injury after fall.  No loss of consciousness.  EXAM: CT HEAD WITHOUT CONTRAST  CT CERVICAL SPINE WITHOUT CONTRAST  TECHNIQUE: Multidetector CT imaging of the head and cervical spine was performed following the standard protocol without intravenous contrast. Multiplanar CT image reconstructions of the cervical spine were also generated.  COMPARISON:  CT scan of August 25, 2014.  FINDINGS: CT HEAD FINDINGS  Bony calvarium appears intact. Moderate diffuse cortical atrophy is noted. Mild chronic ischemic white matter disease is noted. No mass effect or midline shift is noted. Ventricular size is within normal limits. There is no evidence of mass lesion, hemorrhage or acute infarction.  CT CERVICAL SPINE FINDINGS  No fracture or significant spondylolisthesis is noted. Moderate degenerative disc disease is noted at C5-6 and C6-7 with anterior osteophyte formation. Visualized lung apices appear normal. Degenerative changes seen involving the posterior facet joints of throughout the cervical spine bilaterally.  IMPRESSION: Moderate diffuse cortical atrophy. Moderate chronic ischemic white matter disease. No acute intracranial abnormality seen.  Multilevel degenerative disc disease is noted. No acute abnormality seen in the cervical  spine.   Electronically Signed   By: Marijo Conception, M.D.   On: 03/03/2015 13:53   Ct Hip Right Wo Contrast  03/03/2015   CLINICAL DATA:  81 year old with right hip pain after falling. Evaluate for fracture. History of chronic kidney disease, hypertension and Alzheimer's. Initial encounter.  EXAM: CT OF THE RIGHT HIP WITHOUT CONTRAST  TECHNIQUE: Multidetector CT imaging of the right hip was performed according to the standard protocol. Multiplanar CT image reconstructions were also generated.  COMPARISON:  Radiograph same date.  FINDINGS: Examination is limited to right hip and right hemipelvis. The bones are moderately demineralized.  As demonstrated radiographically, there is a displaced comminuted fracture of the right femoral lesser trochanter. This is  consistent with an avulsion fracture, and the iliopsoas tendon inserts on the avulsed fragment. There is a nondisplaced fracture of the greater trochanter, best seen on the coronal images. No definite displaced intertrochanteric or basicervical fracture identified.  Moderate right hip degenerative changes are present. There is a chondroid lesion inferiorly in the right femoral head which demonstrates no aggressive characteristics. Degenerative changes are also present at the symphysis pubis and right sacroiliac joint. On the sagittal images, there are degenerative changes in the lower coccyx.  There is subcutaneous edema surrounding the right hip without large soft tissue hematoma or joint effusion.  Incidentally noted is a 4.3 cm lipoma dependently within the cecum, possibly related to the ileocecal valve.  IMPRESSION: 1. CT confirms the presence of a displaced comminuted fracture of the right femoral lesser trochanter. In addition, there is a nondisplaced fracture of the greater trochanter. 2. Given these 2 fractures, there is at least moderate probability of an occult intertrochanteric fracture, not demonstrated by this examination. 3. Degenerative changes  as described.   Electronically Signed   By: Richardean Sale M.D.   On: 03/03/2015 15:19   Dg Hip Unilat With Pelvis 2-3 Views Right  03/03/2015   CLINICAL DATA:  Acute right proximal femur pain after fall. Initial encounter.  EXAM: RIGHT HIP (WITH PELVIS) 2-3 VIEWS  COMPARISON:  None.  FINDINGS: Severely displaced fracture of lesser trochanter of proximal right femur is noted. Severe degenerative joint disease of right hip is noted. Mild degenerative joint disease of left hip is noted.  IMPRESSION: Severely displaced avulsion fracture of lesser trochanter of proximal right femur is noted.   Electronically Signed   By: Marijo Conception, M.D.   On: 03/03/2015 13:44   Dg Femur, Min 2 Views Right  03/03/2015   CLINICAL DATA:  Pain following fall  EXAM: RIGHT FEMUR 2 VIEWS  COMPARISON:  None.  FINDINGS: Frontal and lateral views were obtained. There is avulsion of the lesser trochanter. No other fracture demonstrated. No dislocation. There is moderate narrowing of the hip and knee joints on the right. No knee joint effusion.  IMPRESSION: Avulsion of the lesser trochanter. No dislocation. Osteoarthritic change in the right hip and knee joints.   Electronically Signed   By: Lowella Grip III M.D.   On: 03/03/2015 13:45    Review of Systems  Constitutional: Negative for fever and chills.  Respiratory: Negative for cough.   Cardiovascular: Negative for chest pain and orthopnea.  Psychiatric/Behavioral: Positive for depression and memory loss. The patient is nervous/anxious.    Blood pressure 160/127, pulse 70, temperature 97.9 F (36.6 [name]), temperature source Oral, resp. rate 22, height 5' 6"  (1.676 m), weight 38.556 kg (85 lb), SpO2 97 %. Physical Exam  Constitutional: She appears well-developed and well-nourished.  HENT:  Head: Normocephalic and atraumatic.  Eyes: Pupils are equal, round, and reactive to light.  Neck: Normal range of motion.  Cardiovascular: Normal rate, regular rhythm and normal  heart sounds.   Respiratory: Effort normal.  GI: Soft.  Musculoskeletal:  Pain with right hip ROM.   Psychiatric:  Confused, had morphine, has some dementia    Assessment/Plan: Right IT hip fx for surgery tomorrow afternoon. NPO after MN  Andrea [name] 03/03/2015, 6:35 PM

## 2015-03-05 ENCOUNTER — Encounter (HOSPITAL_COMMUNITY): Payer: Self-pay | Admitting: Orthopaedic Surgery

## 2015-03-05 ENCOUNTER — Encounter: Payer: Self-pay | Admitting: Family Medicine

## 2015-03-05 ENCOUNTER — Inpatient Hospital Stay (HOSPITAL_COMMUNITY): Payer: Medicare Other

## 2015-03-05 LAB — COMPREHENSIVE METABOLIC PANEL
ALBUMIN: 2.9 g/dL — AB (ref 3.5–5.2)
ALT: 19 U/L (ref 0–35)
AST: 21 U/L (ref 0–37)
Alkaline Phosphatase: 43 U/L (ref 39–117)
Anion gap: 8 (ref 5–15)
BUN: 29 mg/dL — ABNORMAL HIGH (ref 6–23)
CALCIUM: 8.5 mg/dL (ref 8.4–10.5)
CO2: 23 mmol/L (ref 19–32)
Chloride: 103 mmol/L (ref 96–112)
Creatinine, Ser: 1.63 mg/dL — ABNORMAL HIGH (ref 0.50–1.10)
GFR calc Af Amer: 30 mL/min — ABNORMAL LOW (ref >90)
GFR calc non Af Amer: 26 mL/min — ABNORMAL LOW (ref >90)
Glucose, Bld: 108 mg/dL — ABNORMAL HIGH (ref 70–99)
Potassium: 4.9 mmol/L (ref 3.5–5.1)
SODIUM: 134 mmol/L — AB (ref 135–145)
TOTAL PROTEIN: 4.5 g/dL — AB (ref 6.0–8.3)
Total Bilirubin: 0.7 mg/dL (ref 0.3–1.2)

## 2015-03-05 LAB — BLOOD GAS, ARTERIAL
Acid-base deficit: 3.8 mmol/L — ABNORMAL HIGH (ref 0.0–2.0)
Bicarbonate: 18.2 mEq/L — ABNORMAL LOW (ref 20.0–24.0)
Drawn by: 252031
O2 Content: 4 L/min
O2 Saturation: 99.6 %
Patient temperature: 100.5
TCO2: 18.8 mmol/L (ref 0–100)
pCO2 arterial: 21.3 mmHg — ABNORMAL LOW (ref 35.0–45.0)
pH, Arterial: 7.545 — ABNORMAL HIGH (ref 7.350–7.450)
pO2, Arterial: 162 mmHg — ABNORMAL HIGH (ref 80.0–100.0)

## 2015-03-05 MED ORDER — LORAZEPAM 2 MG/ML IJ SOLN
0.5000 mg | Freq: Once | INTRAMUSCULAR | Status: AC
  Administered 2015-03-05: 0.5 mg via INTRAVENOUS
  Filled 2015-03-05: qty 1

## 2015-03-05 MED ORDER — LEVALBUTEROL HCL 0.63 MG/3ML IN NEBU
INHALATION_SOLUTION | RESPIRATORY_TRACT | Status: AC
  Administered 2015-03-05: 0.63 mg
  Filled 2015-03-05: qty 3

## 2015-03-05 MED ORDER — LEVALBUTEROL HCL 1.25 MG/0.5ML IN NEBU
1.2500 mg | INHALATION_SOLUTION | Freq: Four times a day (QID) | RESPIRATORY_TRACT | Status: DC | PRN
  Filled 2015-03-05: qty 0.5

## 2015-03-05 MED ORDER — STARCH (THICKENING) PO POWD
ORAL | Status: DC | PRN
  Filled 2015-03-05: qty 227

## 2015-03-05 MED ORDER — RESOURCE THICKENUP CLEAR PO POWD
ORAL | Status: DC | PRN
  Filled 2015-03-05: qty 125

## 2015-03-05 MED ORDER — BISACODYL 10 MG RE SUPP: 10.0000 mg | Freq: Every day | RECTAL | Status: DC | PRN

## 2015-03-05 MED ORDER — ENSURE PUDDING PO PUDG
1.0000 | Freq: Three times a day (TID) | ORAL | Status: DC
  Administered 2015-03-05 – 2015-03-07 (×5): 1 via ORAL

## 2015-03-05 MED ORDER — SODIUM CHLORIDE 0.9 % IV SOLN
INTRAVENOUS | Status: DC
  Administered 2015-03-05 – 2015-03-07 (×2): via INTRAVENOUS
  Filled 2015-03-05 (×5): qty 1000

## 2015-03-05 MED ORDER — TRAMADOL HCL 50 MG PO TABS
50.0000 mg | ORAL_TABLET | Freq: Four times a day (QID) | ORAL | Status: DC | PRN
  Administered 2015-03-06 – 2015-03-07 (×5): 50 mg via ORAL
  Filled 2015-03-05 (×5): qty 1

## 2015-03-05 NOTE — Progress Notes (Signed)
PT Cancellation Note  Patient Details Name: Andrea LazierGeraldine [name] MRN: [id number] DOB: 18-Dec-1918   Cancelled Treatment:    Reason Eval/Treat Not Completed: Patient not medically ready Patient remains on strict bed rest orders. Will follow up for comprehensive PT evaluation when patient is medically ready. MD paged.   Berton MountBarbour, Skye Plamondon S 03/05/2015, 8:13 AM Charlsie MerlesLogan Secor Ruthie Berch, PT [phone]

## 2015-03-05 NOTE — Progress Notes (Signed)
Brief Nutrition Follow-Up Note  Pt s/p Procedure(s) on 03/04/15: INTRAMEDULLARY (IM) NAIL INTERTROCHANTRIC (Right)  Full assessment completed by RD on 03/04/15. Refer to assessment for further details.  SLP evaluated pt ealrier this AM. Pt ha sbeen upgraded to a dysphagia 1 diet with nectar thick liquids due tio lethargy and delayed swallow initiation. Will order Ensure Pudding po TID, each supplement provides 170 kcal and 4 grams of protein to maximize nutritional status.   RD will continue to follow.   Ira Busbin A. Mayford KnifeWilliams, RD, LDN, CDE Pager: [phone] After hours Pager: [phone]

## 2015-03-05 NOTE — Progress Notes (Signed)
ABG drawn in right radial artery.

## 2015-03-05 NOTE — Progress Notes (Signed)
OT Cancellation Note  Patient Details Name: Andrea LazierGeraldine Devos MRN: [id number] DOB: 11-15-1919   Cancelled Treatment:    Reason Eval/Treat Not Completed: Patient not medically ready Please update activity orders to begin therapy. Pt has active bedrest orders.  [facility] Center[facility]ARY  Jayleen Scaglione, OTR/L  [phone] 03/05/2015 03/05/2015, 12:16 PM

## 2015-03-05 NOTE — Progress Notes (Signed)
Subjective: 1 Day Post-Op Procedure(s) (LRB): INTRAMEDULLARY (IM) NAIL INTERTROCHANTRIC (Right) Patient reports pain as no complaint of pain. patient sleepy.    Objective: Vital signs in last 24 hours: Temp:  [97.6 F (36.4 C)-100.3 F (37.9 C)] 99 F (37.2 C) (04/14 0459) Pulse Rate:  [38-77] 70 (04/14 0459) Resp:  [13-32] 21 (04/14 0459) BP: (95-155)/(41-79) 95/47 mmHg (04/14 0459) SpO2:  [93 %-100 %] 100 % (04/14 0459)  Intake/Output from previous day: 04/13 0701 - 04/14 0700 In: 1531.8 [I.V.:1531.8] Out: 200 [Urine:200] Intake/Output this shift:     Recent Labs  03/03/15 1400 03/04/15 0405  HGB 13.1 11.5*    Recent Labs  03/03/15 1400 03/04/15 0405  WBC 16.4* 12.7*  RBC 4.26 3.68*  HCT 40.0 35.3*  PLT 293 251    Recent Labs  03/03/15 1400 03/04/15 0405  NA 139 137  K 5.0 4.6  CL 102 103  CO2 23 24  BUN 26* 29*  CREATININE 1.34* 1.43*  GLUCOSE 95 117*  CALCIUM 10.3 9.5    Recent Labs  03/03/15 1400  INR 1.14    Neurologically intact dressing dry.   Assessment/Plan: 1 Day Post-Op Procedure(s) (LRB): INTRAMEDULLARY (IM) NAIL INTERTROCHANTRIC (Right) Up with therapy transfers ETC.    Her daughter plans for her to go back home with already available help .   Si Jachim C 03/05/2015, 8:16 AM

## 2015-03-05 NOTE — Progress Notes (Addendum)
[facility] PROGRESS NOTE  Andrea [name] ZOX:[id number] DOB: 1919-03-03 DOA: 03/03/2015 PCP: Thayer Headings, MD  Assessment/Plan: Active Problems:   Essential hypertension   Dementia without behavioral disturbance   CKD (chronic kidney disease), stage III   Right hip pain   Hip fracture   Protein-calorie malnutrition, severe      Right hip pain/Hip fracture Status post INTRAMEDULLARY (IM) NAIL INTERTROCHANTRIC (Right 4/13 - Hip fracture order set placed Apparently family wants to take the patient home Hospital course complicated by agitation, hypoventilation, hypoxia every night Start the patient on tramadol as needed for pain control and minimize narcotics  Toxic encephalopathy Secondary to dementia, sundowning, UTI, postsurgical confusion Minimize benzodiazepines at night, minimize narcotics    COPD/hypoxemic last night Stable on 2 L Chest x-ray negative DC albuterol, switched to Xopenex and this may be contributing to the patient's anxiety    Essential hypertension Blood pressure soft, beta blocker held - Hydralazine when necessary elevated blood pressure   Dementia without behavioral disturbance - Stable continue home regimen Swallow evaluation recommends Dysphagia 1 (Puree);Nectar-thick liquid     CKD (chronic kidney disease), stage III - We'll hold ARB Continue gentle IV fluids  UTI Cont  Rocephin 1 g daily 5 days Follow urine culture  Code Status: DO NOT RESUSCITATE Family Communication: family updated about patient's clinical progress Disposition Plan: Formal physical therapy evaluation is pending    Brief narrative: Andrea [name] is a 81 y.o. female with history of dementia, hypertension, and chronic kidney disease stage III Presenting to the hospital after mechanical fall landed on right side. Developed pain at right side subsequently. As a result patient was brought to the ED for further evaluation and recommendations. While  in the ED patient has had workup which revealed after CT scan of hip a displaced comminuted fracture of the right femoral lesser trochanter and a nondisplaced fracture of the greater trochanter. Currently suspicion is for occult intertrochanteric fracture. Patient also had CT scan of head given history of fall and there is no acute intracranial abnormality reported   Consultants:  Orthopedics  Procedures:  None  Antibiotics: Cefazolin/ceftriaxone  HPI/Subjective: Somnolent this morning, apparently had a rapid response last night secondary to hypoxia and hypoventilation relieved after receiving IV Ativan  Objective: Filed Vitals:   03/05/15 0459 03/05/15 0800 03/05/15 1005 03/05/15 1200  BP: 95/47  92/54   Pulse: 70  69   Temp: 99 F (37.2 C)  99 F (37.2 C)   TempSrc: Axillary  Oral   Resp: Height:      Weight:      SpO2: 100% 99% 98% 100%    Intake/Output Summary (Last 24 hours) at 03/05/15 1208 Last data filed at 03/05/15 0730  Gross per 24 hour  Intake 1531.83 ml  Output    275 ml  Net 1256.83 ml    Exam:  General: Somnolent Lungs: Clear to auscultation bilaterally without wheezes or crackles Cardiovascular: Regular rate and rhythm without murmur gallop or rub normal S1 and S2 Abdomen: Nontender, nondistended, soft, bowel sounds positive, no rebound, no ascites, no appreciable mass Extremities: Pain with right hip ROM      Data Reviewed: Basic Metabolic Panel:  Recent Labs Lab 02/26/15 1658 03/03/15 1400 03/04/15 0405 03/05/15 1030  NA 135 139 137 134*  K 4.7 5.0 4.6 4.9  CL 99 102 103 103  CO2 GLUCOSE 91 95 117* 108*  BUN 26* 26* 29* 29*  CREATININE 1.40*  1.34* 1.43* 1.63*  CALCIUM 9.8 10.3 9.5 8.5    Liver Function Tests:  Recent Labs Lab 03/05/15 1030  AST 21  ALT 19  ALKPHOS 43  BILITOT 0.7  PROT 4.5*  ALBUMIN 2.9*   No results for input(s): LIPASE, AMYLASE in the last 168 hours. No results for  input(s): AMMONIA in the last 168 hours.  CBC:  Recent Labs Lab 03/03/15 1400 03/04/15 0405  WBC 16.4* 12.7*  NEUTROABS 11.0*  --   HGB 13.1 11.5*  HCT 40.0 35.3*  MCV 93.9 95.9  PLT 293 251    Cardiac Enzymes: No results for input(s): CKTOTAL, CKMB, CKMBINDEX, TROPONINI in the last 168 hours. BNP (last 3 results) No results for input(s): BNP in the last 8760 hours.  ProBNP (last 3 results)  Recent Labs  09/12/14 0102 10/01/14 0938  PROBNP 9765.0* 1346.0*      CBG: No results for input(s): GLUCAP in the last 168 hours.  No results found for this or any previous visit (from the past 240 hour(s)).   Studies: Dg Chest 1 View  03/04/2015   CLINICAL DATA:  81 year old female with acute respiratory failure. Initial encounter.  EXAM: CHEST  1 VIEW  COMPARISON:  03/03/2015 and earlier.  FINDINGS: Portable AP semi upright view at 0815 hours. Large lung volumes. Mostly chronic appearing bilateral rib fractures (right ninth fracture might be subacute). No pneumothorax. Allowing for portable technique, the lungs are clear. Stable cardiac size and mediastinal contours. Thoracolumbar junction compression fracture again noted.  IMPRESSION: Hyperinflation with no acute cardiopulmonary abnormality identified.   Electronically Signed   By: Odessa Fleming M.D.   On: 03/04/2015 08:21   Dg Chest 1 View  03/03/2015   CLINICAL DATA:  Fall.  Preop.  No chest complaints.  EXAM: CHEST  1 VIEW  COMPARISON:  10/21/2014  FINDINGS: There is hyperinflation of the lungs compatible with COPD. Heart is upper limits normal in size. Tortuosity of the thoracic aorta with scattered calcifications. Lungs are clear. No effusions. Multiple old healed left rib fractures.  IMPRESSION: Hyperinflation/COPD.  No active disease.   Electronically Signed   By: Charlett Nose M.D.   On: 03/03/2015 13:45   Ct Head Wo Contrast  03/03/2015   CLINICAL DATA:  Head injury after fall.  No loss of consciousness.  EXAM: CT HEAD WITHOUT  CONTRAST  CT CERVICAL SPINE WITHOUT CONTRAST  TECHNIQUE: Multidetector CT imaging of the head and cervical spine was performed following the standard protocol without intravenous contrast. Multiplanar CT image reconstructions of the cervical spine were also generated.  COMPARISON:  CT scan of August 25, 2014.  FINDINGS: CT HEAD FINDINGS  Bony calvarium appears intact. Moderate diffuse cortical atrophy is noted. Mild chronic ischemic white matter disease is noted. No mass effect or midline shift is noted. Ventricular size is within normal limits. There is no evidence of mass lesion, hemorrhage or acute infarction.  CT CERVICAL SPINE FINDINGS  No fracture or significant spondylolisthesis is noted. Moderate degenerative disc disease is noted at C5-6 and C6-7 with anterior osteophyte formation. Visualized lung apices appear normal. Degenerative changes seen involving the posterior facet joints of throughout the cervical spine bilaterally.  IMPRESSION: Moderate diffuse cortical atrophy. Moderate chronic ischemic white matter disease. No acute intracranial abnormality seen.  Multilevel degenerative disc disease is noted. No acute abnormality seen in the cervical spine.   Electronically Signed   By: Lupita Raider, M.D.   On: 03/03/2015 13:53   Ct Cervical Spine Wo  Contrast  03/03/2015   CLINICAL DATA:  Head injury after fall.  No loss of consciousness.  EXAM: CT HEAD WITHOUT CONTRAST  CT CERVICAL SPINE WITHOUT CONTRAST  TECHNIQUE: Multidetector CT imaging of the head and cervical spine was performed following the standard protocol without intravenous contrast. Multiplanar CT image reconstructions of the cervical spine were also generated.  COMPARISON:  CT scan of August 25, 2014.  FINDINGS: CT HEAD FINDINGS  Bony calvarium appears intact. Moderate diffuse cortical atrophy is noted. Mild chronic ischemic white matter disease is noted. No mass effect or midline shift is noted. Ventricular size is within normal limits.  There is no evidence of mass lesion, hemorrhage or acute infarction.  CT CERVICAL SPINE FINDINGS  No fracture or significant spondylolisthesis is noted. Moderate degenerative disc disease is noted at C5-6 and C6-7 with anterior osteophyte formation. Visualized lung apices appear normal. Degenerative changes seen involving the posterior facet joints of throughout the cervical spine bilaterally.  IMPRESSION: Moderate diffuse cortical atrophy. Moderate chronic ischemic white matter disease. No acute intracranial abnormality seen.  Multilevel degenerative disc disease is noted. No acute abnormality seen in the cervical spine.   Electronically Signed   By: Lupita RaiderJames  Green Jr, M.D.   On: 03/03/2015 13:53   Ct Hip Right Wo Contrast  03/03/2015   CLINICAL DATA:  81 year old with right hip pain after falling. Evaluate for fracture. History of chronic kidney disease, hypertension and Alzheimer's. Initial encounter.  EXAM: CT OF THE RIGHT HIP WITHOUT CONTRAST  TECHNIQUE: Multidetector CT imaging of the right hip was performed according to the standard protocol. Multiplanar CT image reconstructions were also generated.  COMPARISON:  Radiograph same date.  FINDINGS: Examination is limited to right hip and right hemipelvis. The bones are moderately demineralized.  As demonstrated radiographically, there is a displaced comminuted fracture of the right femoral lesser trochanter. This is consistent with an avulsion fracture, and the iliopsoas tendon inserts on the avulsed fragment. There is a nondisplaced fracture of the greater trochanter, best seen on the coronal images. No definite displaced intertrochanteric or basicervical fracture identified.  Moderate right hip degenerative changes are present. There is a chondroid lesion inferiorly in the right femoral head which demonstrates no aggressive characteristics. Degenerative changes are also present at the symphysis pubis and right sacroiliac joint. On the sagittal images, there  are degenerative changes in the lower coccyx.  There is subcutaneous edema surrounding the right hip without large soft tissue hematoma or joint effusion.  Incidentally noted is a 4.3 cm lipoma dependently within the cecum, possibly related to the ileocecal valve.  IMPRESSION: 1. CT confirms the presence of a displaced comminuted fracture of the right femoral lesser trochanter. In addition, there is a nondisplaced fracture of the greater trochanter. 2. Given these 2 fractures, there is at least moderate probability of an occult intertrochanteric fracture, not demonstrated by this examination. 3. Degenerative changes as described.   Electronically Signed   By: Carey BullocksWilliam  Veazey M.D.   On: 03/03/2015 15:19   Dg Chest Port 1 View  03/05/2015   CLINICAL DATA:  Shortness of breath. Pain in the neck. Recent hip surgery.  EXAM: PORTABLE CHEST - 1 VIEW  COMPARISON:  03/04/2015  FINDINGS: Chronic pulmonary hyperinflation and apical lucency. There is no edema, consolidation, effusion, or pneumothorax. Multiple remote left posterior rib fractures. There is also a chronic compression fracture of L1 with advanced height loss.  No cardiomegaly.  Negative aortic and hilar contours.  IMPRESSION: COPD without acute superimposed disease.  Electronically Signed   By: Marnee Spring M.D.   On: 03/05/2015 02:47   Dg C-arm 1-60 Min  03/04/2015   CLINICAL DATA:  Fracture fixation.  EXAM: RIGHT FEMUR 2 VIEWS; DG C-ARM 61-120 MIN  COMPARISON:  Radiographs 03/03/2015  FINDINGS: There is an intramedullary gamma nail in the right femur with a proximal dynamic hip screw and a distal interlocking screw. Avulsion fracture of the lesser trochanter is unchanged.  IMPRESSION: Internal fixation of a right hip fracture.   Electronically Signed   By: Rudie Meyer M.D.   On: 03/04/2015 17:28   Dg Hip Unilat With Pelvis 2-3 Views Right  03/03/2015   CLINICAL DATA:  Acute right proximal femur pain after fall. Initial encounter.  EXAM: RIGHT HIP  (WITH PELVIS) 2-3 VIEWS  COMPARISON:  None.  FINDINGS: Severely displaced fracture of lesser trochanter of proximal right femur is noted. Severe degenerative joint disease of right hip is noted. Mild degenerative joint disease of left hip is noted.  IMPRESSION: Severely displaced avulsion fracture of lesser trochanter of proximal right femur is noted.   Electronically Signed   By: Lupita Raider, M.D.   On: 03/03/2015 13:44   Dg Femur, Min 2 Views Right  03/04/2015   CLINICAL DATA:  Fracture fixation.  EXAM: RIGHT FEMUR 2 VIEWS; DG C-ARM 61-120 MIN  COMPARISON:  Radiographs 03/03/2015  FINDINGS: There is an intramedullary gamma nail in the right femur with a proximal dynamic hip screw and a distal interlocking screw. Avulsion fracture of the lesser trochanter is unchanged.  IMPRESSION: Internal fixation of a right hip fracture.   Electronically Signed   By: Rudie Meyer M.D.   On: 03/04/2015 17:28   Dg Femur, Min 2 Views Right  03/03/2015   CLINICAL DATA:  Pain following fall  EXAM: RIGHT FEMUR 2 VIEWS  COMPARISON:  None.  FINDINGS: Frontal and lateral views were obtained. There is avulsion of the lesser trochanter. No other fracture demonstrated. No dislocation. There is moderate narrowing of the hip and knee joints on the right. No knee joint effusion.  IMPRESSION: Avulsion of the lesser trochanter. No dislocation. Osteoarthritic change in the right hip and knee joints.   Electronically Signed   By: Bretta Bang III M.D.   On: 03/03/2015 13:45    Scheduled Meds: . cefTRIAXone (ROCEPHIN)  IV  1 g Intravenous Q24H  . escitalopram  20 mg Oral Daily  . feeding supplement (ENSURE)  1 Container Oral TID BM  . senna-docusate  1 tablet Oral QHS   Continuous Infusions: . 0.45 % NaCl with KCl 20 mEq / L 85 mL/hr at 03/05/15 1016  . sodium chloride    . lactated ringers 10 mL/hr at 03/04/15 1446    Active Problems:   Essential hypertension   Dementia without behavioral disturbance   CKD  (chronic kidney disease), stage III   Right hip pain   Hip fracture   Protein-calorie malnutrition, severe    Time spent: 40 minutes   [facility]  [facility] Pager [phone]. If 7PM-7AM, please contact night-coverage at [email or web address], password [facility] 03/05/2015, 12:08 PM  LOS: 2 days

## 2015-03-05 NOTE — Op Note (Signed)
NAMJaci Lazier:  Locken, Andrea [name]          ACCOUNT NO.:  [id number]  MEDICAL RECORD NO.:  [id number]  LOCATION:                                 FACILITY:  PHYSICIAN:  Kc Sedlak C. Ophelia CharterYates, M.D.    DATE OF BIRTH:  1919-07-23  DATE OF PROCEDURE:  03/04/2015 DATE OF DISCHARGE:                              OPERATIVE REPORT   PREOPERATIVE DIAGNOSIS:  Right intertrochanteric fracture.  POSTOPERATIVE DIAGNOSIS:  Right intertrochanteric fracture.  PROCEDURE:  Right Affixus nail, 11 mm nail with proximal and distal interlock, 100 mm nail, and 38 mm distal bicortical screw.  SURGEON:  Adalei Novell C. Ophelia CharterYates, M.D.  ASSISTANT:  Zonia KiefJames Owens, PA-C, medically necessary and present for the procedure.  ANESTHESIA:  General plus local.  ESTIMATED BLOOD LOSS:  Minimal.  DESCRIPTION OF PROCEDURE:  After induction of general anesthesia, the patient was placed on the fracture table.  Careful positioning and C-arm was brought in.  While the leg holder was used, the foot was internally rotated.  Lesser trochanter also had  separate fragment, greater trochanter had a fracture at the tip, and the intertrochanteric fracture line was difficult to see except on CT scan.  Prepping with DuraPrep alignment looked good.  The usual towels were  squared off, large shower curtain, Betadine, Steri-Drape.  Time-out procedure completed.  Incision was made in proximal trochanter.  Gluteus medius was split.  Pin was placed at the tip of the trochanter, checked under lateral, advanced, over reamed, and then the short nail was inserted down the appropriate position under C-arm.  The pin was advanced with a guide, drilled up center on head on lateral and low on head on AP.  Size drained and nail inserted, locked down tightly at the top, and then the distal interlock was placed, which was a 38 mm screw.  Irrigation, final spot pictures, and then standard layered closure with #1 Vicryl in the deep fascia, 2-0 Vicryl subcutaneous tissue, skin  staple closure, Marcaine infiltration, and three small incisions spots, postop dressing, and transferred to the Recovery Room.     Monque Haggar C. Ophelia CharterYates, M.D.     MCY/MEDQ  D:  03/04/2015  T:  03/05/2015  Job:  [id number]

## 2015-03-05 NOTE — Evaluation (Signed)
Physical Therapy Evaluation Patient Details Name: Andrea [name] MRN: [id number] DOB: [birth year] Today's Date: 03/05/2015   History of Present Illness  81 y.o. female with Hx of dementia had a fall at home resulting in a Rt intertrochanteric Fx. S/p INTRAMEDULLARY (IM) NAIL INTERTROCHANTRIC (RT)  Clinical Impression  Patient is seen following the above procedure and presents with functional limitations due to the deficits listed below (see PT Problem List). Limited by pain and agitation however patient was able to tolerate bed mobility, ROM exercises, and transfer training. Requires min-max assist +2 for these tasks. Stood at edge of bed briefly with max assist +2. Patient will benefit from skilled PT to increase their independence and safety with mobility to allow discharge to the venue listed below.       Follow Up Recommendations SNF;Supervision/Assistance - 24 hour    Equipment Recommendations   (TBD at next venue of care)    Recommendations for Other Services       Precautions / Restrictions Precautions Precautions: Fall Restrictions Weight Bearing Restrictions: Yes RLE Weight Bearing: Weight bearing as tolerated      Mobility  Bed Mobility Overal bed mobility: Needs Assistance;+2 for physical assistance Bed Mobility: Supine to Sit;Sit to Supine     Supine to sit: Mod assist;HOB elevated Sit to supine: Max assist;+2 for physical assistance   General bed mobility comments: Mod assist for RLE and trucal support for transition from supine to sit. Demonstrates ability to slowly Scoot RLE laterall to edge of bed with some support. Max assist to return to supine with +2 support for trunk and LEs.  Transfers Overall transfer level: Needs assistance Equipment used: None;2 person hand held assist Transfers: Sit to/from Stand Sit to Stand: Max assist;+2 physical assistance;From elevated surface         General transfer comment: Max assist for boost and balance from  elevated bed surface with +2 support. Pt able to bear some weight through RLE although unable to bring hips fully away from bed, leaning for support. Tolerated <1 minute.  Ambulation/Gait                Stairs            Wheelchair Mobility    Modified Rankin (Stroke Patients Only)       Balance Overall balance assessment: Needs assistance;History of Falls Sitting-balance support: Feet supported;Single extremity supported Sitting balance-Leahy Scale: Poor Sitting balance - Comments: tolerated sitting EOB x5 min without external support from PT, using UEs for balance but able to balance with 1UE when challenged. Postural control: Left lateral lean Standing balance support: Bilateral upper extremity supported Standing balance-Leahy Scale: Zero                               Pertinent Vitals/Pain Pain Assessment: Faces Faces Pain Scale: Hurts even more Pain Location: Rt hip Pain Descriptors / Indicators: Grimacing;Guarding Pain Intervention(s): Limited activity within patient's tolerance;Monitored during session;Repositioned    Home Living Family/patient expects to be discharged to:: Skilled nursing facility   Available Help at Discharge: Family;Personal care attendant;Available 24 hours/day Type of Home: House Home Access: Stairs to enter Entrance Stairs-Rails: None Entrance Stairs-Number of Steps: 2 Home Layout: Multi-level;Able to live on main level with bedroom/bathroom Home Equipment: Dan Humphreys - 2 wheels;Wheelchair - manual Additional Comments: No family present, one of patient's caregivers assisted in answering questions as able. Some info taken from previous visit.    Prior Function Level  of Independence: Needs assistance   Gait / Transfers Assistance Needed: Amb with rolling walker household distances. Per caregiver, pt sometimes forgets to take RW with her.  ADL's / Homemaking Assistance Needed: caregivers and family assist with bath/dress.  Able to some things on her own "depending on her cognition that day"        Hand Dominance   Dominant Hand: Right    Extremity/Trunk Assessment   Upper Extremity Assessment: Defer to OT evaluation           Lower Extremity Assessment: RLE deficits/detail RLE Deficits / Details: decreased strength and ROM as expected post op.       Communication   Communication: No difficulties  Cognition Arousal/Alertness: Awake/alert Behavior During Therapy: Agitated Overall Cognitive Status: Impaired/Different from baseline (Confused at baseline "depending on the day") Area of Impairment: Orientation;Memory;Following commands;Problem solving Orientation Level: Disoriented to;Place;Time;Situation   Memory: Decreased short-term memory Following Commands: Follows one step commands inconsistently     Problem Solving: Slow processing;Difficulty sequencing;Requires verbal cues;Requires tactile cues General Comments: Speaking about topics out of context for task at hand.    General Comments General comments (skin integrity, edema, etc.): SpO2 95% with 5L supplemental O2    Exercises General Exercises - Lower Extremity Ankle Circles/Pumps: AROM;Both;20 reps;Supine Heel Slides: AAROM;Both;5 reps;Supine Hip ABduction/ADduction: Both;AAROM;10 reps;Supine      Assessment/Plan    PT Assessment Patient needs continued PT services  PT Diagnosis Difficulty walking;Generalized weakness;Acute pain;Altered mental status   PT Problem List Decreased strength;Decreased range of motion;Decreased activity tolerance;Decreased balance;Decreased mobility;Decreased cognition;Decreased knowledge of use of DME;Decreased safety awareness;Decreased knowledge of precautions;Pain  PT Treatment Interventions DME instruction;Gait training;Functional mobility training;Therapeutic activities;Therapeutic exercise;Balance training;Neuromuscular re-education;Cognitive remediation;Patient/family education;Modalities    PT Goals (Current goals can be found in the Care Plan section) Acute Rehab PT Goals Patient Stated Goal: "go home" PT Goal Formulation: Patient unable to participate in goal setting Time For Goal Achievement: 03/19/15 Potential to Achieve Goals: Fair    Frequency Min 2X/week   Barriers to discharge        Co-evaluation               End of Session Equipment Utilized During Treatment: Gait belt;Oxygen Activity Tolerance: No increased pain;Treatment limited secondary to agitation Patient left: in bed;with call bell/phone within reach;with bed alarm set;with family/visitor present;with SCD's reapplied Nurse Communication: Mobility status         Time: [id number] PT Time Calculation (min) (ACUTE ONLY): 32 min   Charges:   PT Evaluation $Initial PT Evaluation Tier I: 1 Procedure PT Treatments $Therapeutic Activity: 8-22 mins   PT G CodesBerton Mount:        Handy Mcloud S 03/05/2015, 5:15 PM Sunday SpillersLogan [place], [place] [phone]

## 2015-03-05 NOTE — Evaluation (Signed)
Clinical/Bedside Swallow Evaluation Patient Details  Name: Andrea [name] MRN: [id number] Date of Birth: 1919/08/27  Today's Date: 03/05/2015 Time: SLP Start Time (ACUTE ONLY): 0856 SLP Stop Time (ACUTE ONLY): 0909 SLP Time Calculation (min) (ACUTE ONLY): 13 min  Past Medical History:  Past Medical History  Diagnosis Date  . COPD (chronic obstructive pulmonary disease)   . Alzheimer's dementia   . Depression   . Chronic kidney disease   . Hypertension    Past Surgical History: History reviewed. No pertinent past surgical history. HPI:  Andrea [name] is a 81 y.o. female with history of dementia, hypertension, and chronic kidney disease stage III, presenting to the hospital after mechanical fall resulting in right hip fracture. Andrea [name] 4/13 for surgical management. Later that evening, nursing staff noted coughing with thin liquids after which Andrea [name] and swallow evaluation was ordered.     Assessment / Plan / Recommendation Clinical Impression  Andrea [name] this morning, due in part to medications (received ativan overnight), although will awaken to auditory and tactile simuli in order to engage in PO trials. Thin liquids result in immediate cough and multiple audible swallows. Suspect delayed swallow initiation with all consistencies tested, but overt signs of airway penetration are minimized with cup sips of nectar thick liquids and pureed solids. Recommend Dys 1 diet and nectar thick liquids, to be consumed when Andrea [name] is more optimally alert. Will continue to follow for tolerance and possible advancement.    Aspiration Risk  Moderate    Diet Recommendation Dysphagia 1 (Puree);Nectar-thick liquid   Liquid Administration via: Cup;No straw Medication Administration: Whole meds with puree Supervision: Staff to assist with self feeding;Full supervision/cueing for compensatory strategies Compensations: Slow rate;Small sips/bites Postural Changes and/[name]:  Seated upright 90 degrees;Upright 30-60 min after meal    Other  Recommendations Oral Care Recommendations: Oral care BID   Follow Up Recommendations   (tba)    Frequency and Duration min 2x/week  2 weeks   Pertinent Vitals/Pain n/a    SLP Swallow Goals     Swallow Study Prior Functional Status       General HPI: Andrea [name] is a 81 y.o. female with history of dementia, hypertension, and chronic kidney disease stage III, presenting to the hospital after mechanical fall resulting in right hip fracture. Andrea [name] 4/13 for surgical management. Later that evening, nursing staff noted coughing with thin liquids after which Andrea [name] and swallow evaluation was ordered.   Type of Study: Bedside swallow evaluation Previous Swallow Assessment: none in chart Diet Prior to this Study: [name] Temperature Spikes Noted: Yes Respiratory Status: Nasal cannula History of Recent Intubation: Yes Length of Intubations (days):  (<24 hours - for surgery) Date extubated: 03/04/15 Behavior/Cognition: [name];Cooperative;Pleasant mood;Other (comment) (awakens for POs) Self-Feeding Abilities: Total assist Patient Positioning: Upright in bed Baseline Vocal Quality: Low vocal intensity    Oral/Motor/Sensory Function Overall Oral Motor/Sensory Function:  (generalized weakness)   Ice Chips Ice chips: Impaired Presentation: Spoon Pharyngeal Phase Impairments: Throat Clearing - Immediate;Suspected delayed Swallow;Other (comments) (audible swallow)   Thin Liquid Thin Liquid: Impaired Presentation: Cup;Straw Pharyngeal  Phase Impairments: Cough - Immediate;Suspected delayed Swallow;Other (comments);Multiple swallows (audible swallow)    Nectar Thick Nectar Thick Liquid: Impaired Presentation: Cup Pharyngeal Phase Impairments: Suspected delayed Swallow   Honey Thick Honey Thick Liquid: Not tested   Puree Puree: Impaired Presentation: Spoon Oral Phase Functional Implications: Prolonged oral  transit Pharyngeal Phase Impairments: Suspected delayed Swallow   Solid  Solid: Not tested      Andrea HamLaura [name], M.A. CCC-SLP [phone]  Andrea Hamaiewonsky, Andrea [name] 03/05/2015,9:25 AM

## 2015-03-05 NOTE — Clinical Social Work Note (Addendum)
CSW received consult for patient to go to SNF for short term rehab, due to patient's dementia, CSW was not able to assess.  CSW will attempt to contact patient's family tomorrow FL2 on chart awaiting signature by physician.  Ervin KnackEric R. Mariafernanda Hendricksen, MSW, [employer]WA [phone]

## 2015-03-05 NOTE — Significant Event (Addendum)
Rapid Response Event Note  Overview: Time Called: 0200 Arrival Time: 0228 Event Type: Respiratory  Initial Focused Assessment:  Called by Einar CrowBrandyn , RN 6N  With report of patient c/o sudden onset SOB.    Pt was given an Albuterol neb 2.5 mg  and placed on NRMask at 15 l for comfort.  O2 sats on 4l Floyd 99%   Upon my arrival pt is sitting up in bed, skin warm to touch with oral temp 99.5, oriented to self and place, follows commands.  Bilateral breath sounds clear throughout.  Denies pain at this time    95/41   74 regular,  32RR   Right hip dressing clean and dry/   Interventions:   Obtain rectal temp, PCXR,   Discussed with Tama GanderKatherine Schorr, NP,  Obtain ABG  Placed on Continuous pulse ox.  Event Summary:   At 0230 pt c/o anterior neck pain , denies chest pain  or hip pain.  12 lead EKG ordered.  Pt given  .5 mg Morphine IV at 0232                                   0300 CXR:  COPD without superimposed disease.   Pt less agitated.  EKG  Rate 79  SR with PVC's . ST segment abnormality       ABG  7.54  21.3      162                   0320  Hand off report to [place], RN  Pt to receive ativan .5 mg IV  Per Tama GanderKatherine Schorr, NP.  Will follow as needed.  Report given to health care provider  At bedside                Name of Physician Notified: Tama GanderKatherine, Schorr, NP at 0220    at    Outcome: Stayed in room and stabalized     Keiran Sias, [place]

## 2015-03-06 ENCOUNTER — Inpatient Hospital Stay (HOSPITAL_COMMUNITY): Payer: Medicare Other

## 2015-03-06 ENCOUNTER — Encounter (HOSPITAL_COMMUNITY): Payer: Self-pay | Admitting: Student

## 2015-03-06 LAB — CBC
HEMATOCRIT: 23.7 % — AB (ref 36.0–46.0)
Hemoglobin: 7.6 g/dL — ABNORMAL LOW (ref 12.0–15.0)
MCH: 31.3 pg (ref 26.0–34.0)
MCHC: 32.1 g/dL (ref 30.0–36.0)
MCV: 97.5 fL (ref 78.0–100.0)
Platelets: 170 10*3/uL (ref 150–400)
RBC: 2.43 MIL/uL — ABNORMAL LOW (ref 3.87–5.11)
RDW: 14 % (ref 11.5–15.5)
WBC: 10.4 10*3/uL (ref 4.0–10.5)

## 2015-03-06 LAB — URINE CULTURE
CULTURE: NO GROWTH
Colony Count: NO GROWTH

## 2015-03-06 LAB — COMPREHENSIVE METABOLIC PANEL
ALK PHOS: 41 U/L (ref 39–117)
ALT: 12 U/L (ref 0–35)
AST: 16 U/L (ref 0–37)
Albumin: 2.6 g/dL — ABNORMAL LOW (ref 3.5–5.2)
Anion gap: 8 (ref 5–15)
BUN: 24 mg/dL — AB (ref 6–23)
CHLORIDE: 107 mmol/L (ref 96–112)
CO2: 22 mmol/L (ref 19–32)
Calcium: 8.4 mg/dL (ref 8.4–10.5)
Creatinine, Ser: 1.26 mg/dL — ABNORMAL HIGH (ref 0.50–1.10)
GFR calc Af Amer: 41 mL/min — ABNORMAL LOW (ref >90)
GFR calc non Af Amer: 36 mL/min — ABNORMAL LOW (ref >90)
Glucose, Bld: 109 mg/dL — ABNORMAL HIGH (ref 70–99)
POTASSIUM: 4.3 mmol/L (ref 3.5–5.1)
Sodium: 137 mmol/L (ref 135–145)
TOTAL PROTEIN: 4.3 g/dL — AB (ref 6.0–8.3)
Total Bilirubin: 0.5 mg/dL (ref 0.3–1.2)

## 2015-03-06 LAB — PREPARE RBC (CROSSMATCH)

## 2015-03-06 LAB — ABO/RH: ABO/RH(D): A POS

## 2015-03-06 MED ORDER — SODIUM CHLORIDE 0.9 % IV SOLN
Freq: Once | INTRAVENOUS | Status: AC
  Administered 2015-03-06: 15:00:00 via INTRAVENOUS

## 2015-03-06 MED ORDER — HYDROCODONE-ACETAMINOPHEN 5-325 MG PO TABS: 1.0000 | ORAL_TABLET | Freq: Four times a day (QID) | ORAL | Status: DC | PRN

## 2015-03-06 MED ORDER — TRAMADOL HCL 50 MG PO TABS: 50.0000 mg | ORAL_TABLET | Freq: Four times a day (QID) | ORAL | Status: DC | PRN

## 2015-03-06 MED ORDER — METOPROLOL TARTRATE 25 MG PO TABS: 12.5000 mg | ORAL_TABLET | Freq: Two times a day (BID) | ORAL | Status: DC

## 2015-03-06 MED ORDER — LORAZEPAM 2 MG/ML IJ SOLN
0.5000 mg | Freq: Once | INTRAMUSCULAR | Status: AC
  Administered 2015-03-06: 0.5 mg via INTRAVENOUS

## 2015-03-06 MED ORDER — STARCH (THICKENING) PO POWD: ORAL | Status: DC

## 2015-03-06 MED ORDER — ENOXAPARIN SODIUM 30 MG/0.3ML ~~LOC~~ SOLN
20.0000 mg | SUBCUTANEOUS | Status: DC
  Administered 2015-03-06: 20 mg via SUBCUTANEOUS
  Filled 2015-03-06 (×2): qty 0.3

## 2015-03-06 MED ORDER — LORAZEPAM 2 MG/ML IJ SOLN
INTRAMUSCULAR | Status: AC
  Filled 2015-03-06: qty 1

## 2015-03-06 MED ORDER — BISACODYL 10 MG RE SUPP: 10.0000 mg | Freq: Every day | RECTAL | Status: DC | PRN

## 2015-03-06 MED ORDER — CLONAZEPAM 0.25 MG PO TBDP: 0.2500 mg | ORAL_TABLET | Freq: Two times a day (BID) | ORAL | Status: DC | PRN

## 2015-03-06 MED ORDER — FUROSEMIDE 10 MG/ML IJ SOLN
40.0000 mg | Freq: Once | INTRAMUSCULAR | Status: AC
  Administered 2015-03-06: 40 mg via INTRAVENOUS
  Filled 2015-03-06: qty 4

## 2015-03-06 MED ORDER — METOPROLOL TARTRATE 12.5 MG HALF TABLET
12.5000 mg | ORAL_TABLET | Freq: Two times a day (BID) | ORAL | Status: DC
Start: 2015-03-06 — End: 2015-03-07
  Administered 2015-03-06 – 2015-03-07 (×2): 12.5 mg via ORAL
  Filled 2015-03-06 (×3): qty 1

## 2015-03-06 MED ORDER — LEVALBUTEROL HCL 1.25 MG/0.5ML IN NEBU: 1.2500 mg | INHALATION_SOLUTION | Freq: Four times a day (QID) | RESPIRATORY_TRACT | Status: DC | PRN

## 2015-03-06 MED ORDER — SENNOSIDES-DOCUSATE SODIUM 8.6-50 MG PO TABS: 1.0000 | ORAL_TABLET | Freq: Every day | ORAL | Status: DC

## 2015-03-06 MED ORDER — HYDROCODONE-ACETAMINOPHEN 5-325 MG PO TABS
1.0000 | ORAL_TABLET | Freq: Once | ORAL | Status: AC
  Administered 2015-03-06: 1 via ORAL
  Filled 2015-03-06: qty 1

## 2015-03-06 MED ORDER — ASPIRIN EC 81 MG PO TBEC: 81.0000 mg | DELAYED_RELEASE_TABLET | Freq: Every day | ORAL | Status: DC

## 2015-03-06 MED ORDER — ENOXAPARIN SODIUM 40 MG/0.4ML ~~LOC~~ SOLN: 20.0000 mg | SUBCUTANEOUS | Status: DC

## 2015-03-06 MED ORDER — TECHNETIUM TO 99M ALBUMIN AGGREGATED
3.0000 | Freq: Once | INTRAVENOUS | Status: AC | PRN
  Administered 2015-03-06: 3 via INTRAVENOUS

## 2015-03-06 MED ORDER — ENSURE PUDDING PO PUDG: 1.0000 | Freq: Three times a day (TID) | ORAL | Status: DC

## 2015-03-06 NOTE — Progress Notes (Signed)
Stat Type and Screen ordered at 0930. Still has not been drawn. Lab notified.

## 2015-03-06 NOTE — Discharge Summary (Deleted)
Physician Discharge Summary  Andrea [name] MRN: [id number] DOB/AGE: May 28, 1919 81 y.o.  PCP: Thressa Sheller, MD   Admit date: 03/03/2015 Discharge date: 03/06/2015  Discharge Diagnoses:     Active Problems:   Essential hypertension   Dementia without behavioral disturbance   CKD (chronic kidney disease), stage III   Right hip pain   Hip fracture   Protein-calorie malnutrition, severe     Medication List    STOP taking these medications        losartan 100 MG tablet  Commonly known as:  COZAAR      TAKE these medications        acetaminophen 325 MG tablet  Commonly known as:  TYLENOL  Take 2 tablets (650 mg total) by mouth every 6 (six) hours as needed for mild pain or moderate pain (or Fever >/= 101).     albuterol 108 (90 BASE) MCG/ACT inhaler  Commonly known as:  PROVENTIL HFA;VENTOLIN HFA  Inhale 1-2 puffs into the lungs every 6 (six) hours as needed for wheezing or shortness of breath.     ANORO ELLIPTA 62.5-25 MCG/INH Aepb  Generic drug:  Umeclidinium-Vilanterol  Inhale 1 puff into the lungs daily.     aspirin EC 81 MG tablet  Take 1 tablet (81 mg total) by mouth daily.     bisacodyl 10 MG suppository  Commonly known as:  DULCOLAX  Place 1 suppository (10 mg total) rectally daily as needed for moderate constipation or severe constipation.     buPROPion 300 MG 24 hr tablet  Commonly known as:  WELLBUTRIN XL  Take 300 mg by mouth every morning.     clonazePAM 0.25 MG disintegrating tablet  Commonly known as:  KLONOPIN  Take 1 tablet (0.25 mg total) by mouth 2 (two) times daily as needed (anxiety).     enoxaparin 40 MG/0.4ML injection  Commonly known as:  LOVENOX  Inject 0.2 mLs (20 mg total) into the skin daily.     escitalopram 20 MG tablet  Commonly known as:  LEXAPRO  Take 1 tablet (20 mg total) by mouth daily.     feeding supplement (ENSURE) Pudg  Take 1 Container by mouth 3 (three) times daily between meals.     food thickener  Powd  Commonly known as:  THICK IT  30 containers     HYDROcodone-acetaminophen 5-325 MG per tablet  Commonly known as:  NORCO  Take 1 tablet by mouth every 6 (six) hours as needed for moderate pain.     levalbuterol 1.25 MG/0.5ML nebulizer solution  Commonly known as:  XOPENEX  Take 1.25 mg by nebulization every 6 (six) hours as needed for wheezing or shortness of breath.     metoprolol tartrate 25 MG tablet  Commonly known as:  LOPRESSOR  Take 0.5 tablets (12.5 mg total) by mouth 2 (two) times daily.     senna-docusate 8.6-50 MG per tablet  Commonly known as:  Senokot-S  Take 1 tablet by mouth at bedtime.     traMADol 50 MG tablet  Commonly known as:  ULTRAM  Take 1 tablet (50 mg total) by mouth every 6 (six) hours as needed for moderate pain.           Disposition: snf     Significant Diagnostic Studies: Dg Chest 1 View  03/04/2015   CLINICAL DATA:  81 year old female with acute respiratory failure. Initial encounter.  EXAM: CHEST  1 VIEW  COMPARISON:  03/03/2015 and earlier.  FINDINGS: Portable AP semi  upright view at 0815 hours. Large lung volumes. Mostly chronic appearing bilateral rib fractures (right ninth fracture might be subacute). No pneumothorax. Allowing for portable technique, the lungs are clear. Stable cardiac size and mediastinal contours. Thoracolumbar junction compression fracture again noted.  IMPRESSION: Hyperinflation with no acute cardiopulmonary abnormality identified.   Electronically Signed   By: Genevie Ann M.D.   On: 03/04/2015 08:21   Dg Chest 1 View  03/03/2015   CLINICAL DATA:  Fall.  Preop.  No chest complaints.  EXAM: CHEST  1 VIEW  COMPARISON:  10/21/2014  FINDINGS: There is hyperinflation of the lungs compatible with COPD. Heart is upper limits normal in size. Tortuosity of the thoracic aorta with scattered calcifications. Lungs are clear. No effusions. Multiple old healed left rib fractures.  IMPRESSION: Hyperinflation/COPD.  No active disease.    Electronically Signed   By: Rolm Baptise M.D.   On: 03/03/2015 13:45   Ct Head Wo Contrast  03/03/2015   CLINICAL DATA:  Head injury after fall.  No loss of consciousness.  EXAM: CT HEAD WITHOUT CONTRAST  CT CERVICAL SPINE WITHOUT CONTRAST  TECHNIQUE: Multidetector CT imaging of the head and cervical spine was performed following the standard protocol without intravenous contrast. Multiplanar CT image reconstructions of the cervical spine were also generated.  COMPARISON:  CT scan of August 25, 2014.  FINDINGS: CT HEAD FINDINGS  Bony calvarium appears intact. Moderate diffuse cortical atrophy is noted. Mild chronic ischemic white matter disease is noted. No mass effect or midline shift is noted. Ventricular size is within normal limits. There is no evidence of mass lesion, hemorrhage or acute infarction.  CT CERVICAL SPINE FINDINGS  No fracture or significant spondylolisthesis is noted. Moderate degenerative disc disease is noted at C5-6 and C6-7 with anterior osteophyte formation. Visualized lung apices appear normal. Degenerative changes seen involving the posterior facet joints of throughout the cervical spine bilaterally.  IMPRESSION: Moderate diffuse cortical atrophy. Moderate chronic ischemic white matter disease. No acute intracranial abnormality seen.  Multilevel degenerative disc disease is noted. No acute abnormality seen in the cervical spine.   Electronically Signed   By: Marijo Conception, M.D.   On: 03/03/2015 13:53   Ct Cervical Spine Wo Contrast  03/03/2015   CLINICAL DATA:  Head injury after fall.  No loss of consciousness.  EXAM: CT HEAD WITHOUT CONTRAST  CT CERVICAL SPINE WITHOUT CONTRAST  TECHNIQUE: Multidetector CT imaging of the head and cervical spine was performed following the standard protocol without intravenous contrast. Multiplanar CT image reconstructions of the cervical spine were also generated.  COMPARISON:  CT scan of August 25, 2014.  FINDINGS: CT HEAD FINDINGS  Bony calvarium  appears intact. Moderate diffuse cortical atrophy is noted. Mild chronic ischemic white matter disease is noted. No mass effect or midline shift is noted. Ventricular size is within normal limits. There is no evidence of mass lesion, hemorrhage or acute infarction.  CT CERVICAL SPINE FINDINGS  No fracture or significant spondylolisthesis is noted. Moderate degenerative disc disease is noted at C5-6 and C6-7 with anterior osteophyte formation. Visualized lung apices appear normal. Degenerative changes seen involving the posterior facet joints of throughout the cervical spine bilaterally.  IMPRESSION: Moderate diffuse cortical atrophy. Moderate chronic ischemic white matter disease. No acute intracranial abnormality seen.  Multilevel degenerative disc disease is noted. No acute abnormality seen in the cervical spine.   Electronically Signed   By: Marijo Conception, M.D.   On: 03/03/2015 13:53   Ct Hip  Right Wo Contrast  03/03/2015   CLINICAL DATA:  81 year old with right hip pain after falling. Evaluate for fracture. History of chronic kidney disease, hypertension and Alzheimer's. Initial encounter.  EXAM: CT OF THE RIGHT HIP WITHOUT CONTRAST  TECHNIQUE: Multidetector CT imaging of the right hip was performed according to the standard protocol. Multiplanar CT image reconstructions were also generated.  COMPARISON:  Radiograph same date.  FINDINGS: Examination is limited to right hip and right hemipelvis. The bones are moderately demineralized.  As demonstrated radiographically, there is a displaced comminuted fracture of the right femoral lesser trochanter. This is consistent with an avulsion fracture, and the iliopsoas tendon inserts on the avulsed fragment. There is a nondisplaced fracture of the greater trochanter, best seen on the coronal images. No definite displaced intertrochanteric or basicervical fracture identified.  Moderate right hip degenerative changes are present. There is a chondroid lesion inferiorly  in the right femoral head which demonstrates no aggressive characteristics. Degenerative changes are also present at the symphysis pubis and right sacroiliac joint. On the sagittal images, there are degenerative changes in the lower coccyx.  There is subcutaneous edema surrounding the right hip without large soft tissue hematoma or joint effusion.  Incidentally noted is a 4.3 cm lipoma dependently within the cecum, possibly related to the ileocecal valve.  IMPRESSION: 1. CT confirms the presence of a displaced comminuted fracture of the right femoral lesser trochanter. In addition, there is a nondisplaced fracture of the greater trochanter. 2. Given these 2 fractures, there is at least moderate probability of an occult intertrochanteric fracture, not demonstrated by this examination. 3. Degenerative changes as described.   Electronically Signed   By: Richardean Sale M.D.   On: 03/03/2015 15:19   Dg Chest Port 1 View  03/06/2015   CLINICAL DATA:  Shortness of breath  EXAM: PORTABLE CHEST - 1 VIEW  COMPARISON:  03/05/2015  FINDINGS: Support apparatus and skin fold overlies the chest. Mild enlargement of the cardiomediastinal silhouette is reidentified. Hyperaeration and lucency again suggests emphysema. No new focal pulmonary opacity. No pleural effusion. Healed bilateral rib fractures.  IMPRESSION: No new acute cardiopulmonary process.   Electronically Signed   By: Conchita Paris M.D.   On: 03/06/2015 09:46   Dg Chest Port 1 View  03/05/2015   CLINICAL DATA:  Shortness of breath. Pain in the neck. Recent hip surgery.  EXAM: PORTABLE CHEST - 1 VIEW  COMPARISON:  03/04/2015  FINDINGS: Chronic pulmonary hyperinflation and apical lucency. There is no edema, consolidation, effusion, or pneumothorax. Multiple remote left posterior rib fractures. There is also a chronic compression fracture of L1 with advanced height loss.  No cardiomegaly.  Negative aortic and hilar contours.  IMPRESSION: COPD without acute  superimposed disease.   Electronically Signed   By: Monte Fantasia M.D.   On: 03/05/2015 02:47   Dg C-arm 1-60 Min  03/04/2015   CLINICAL DATA:  Fracture fixation.  EXAM: RIGHT FEMUR 2 VIEWS; DG C-ARM 61-120 MIN  COMPARISON:  Radiographs 03/03/2015  FINDINGS: There is an intramedullary gamma nail in the right femur with a proximal dynamic hip screw and a distal interlocking screw. Avulsion fracture of the lesser trochanter is unchanged.  IMPRESSION: Internal fixation of a right hip fracture.   Electronically Signed   By: Marijo Sanes M.D.   On: 03/04/2015 17:28   Dg Hip Unilat With Pelvis 2-3 Views Right  03/03/2015   CLINICAL DATA:  Acute right proximal femur pain after fall. Initial encounter.  EXAM: RIGHT  HIP (WITH PELVIS) 2-3 VIEWS  COMPARISON:  None.  FINDINGS: Severely displaced fracture of lesser trochanter of proximal right femur is noted. Severe degenerative joint disease of right hip is noted. Mild degenerative joint disease of left hip is noted.  IMPRESSION: Severely displaced avulsion fracture of lesser trochanter of proximal right femur is noted.   Electronically Signed   By: Marijo Conception, M.D.   On: 03/03/2015 13:44   Dg Femur, Min 2 Views Right  03/04/2015   CLINICAL DATA:  Fracture fixation.  EXAM: RIGHT FEMUR 2 VIEWS; DG C-ARM 61-120 MIN  COMPARISON:  Radiographs 03/03/2015  FINDINGS: There is an intramedullary gamma nail in the right femur with a proximal dynamic hip screw and a distal interlocking screw. Avulsion fracture of the lesser trochanter is unchanged.  IMPRESSION: Internal fixation of a right hip fracture.   Electronically Signed   By: Marijo Sanes M.D.   On: 03/04/2015 17:28   Dg Femur, Min 2 Views Right  03/03/2015   CLINICAL DATA:  Pain following fall  EXAM: RIGHT FEMUR 2 VIEWS  COMPARISON:  None.  FINDINGS: Frontal and lateral views were obtained. There is avulsion of the lesser trochanter. No other fracture demonstrated. No dislocation. There is moderate narrowing  of the hip and knee joints on the right. No knee joint effusion.  IMPRESSION: Avulsion of the lesser trochanter. No dislocation. Osteoarthritic change in the right hip and knee joints.   Electronically Signed   By: Lowella Grip III M.D.   On: 03/03/2015 13:45      Microbiology: No results found for this or any previous visit (from the past 240 hour(s)).   Labs: Results for orders placed or performed during the hospital encounter of 03/03/15 (from the past 48 hour(s))  Blood gas, arterial     Status: Abnormal   Collection Time: 03/05/15  2:56 AM  Result Value Ref Range   O2 Content 4.0 L/min   Delivery systems NASAL CANNULA    pH, Arterial 7.545 (H) 7.350 - 7.450   pCO2 arterial 21.3 (L) 35.0 - 45.0 mmHg   pO2, Arterial 162.0 (H) 80.0 - 100.0 mmHg   Bicarbonate 18.2 (L) 20.0 - 24.0 mEq/L   TCO2 18.8 0 - 100 mmol/L   Acid-base deficit 3.8 (H) 0.0 - 2.0 mmol/L   O2 Saturation 99.6 %   Patient temperature 100.5    Collection site RIGHT RADIAL    Drawn by [id number]    Sample type ARTERIAL DRAW    Allens test (pass/fail) PASS PASS  Comprehensive metabolic panel     Status: Abnormal   Collection Time: 03/05/15 10:30 AM  Result Value Ref Range   Sodium 134 (L) 135 - 145 mmol/L   Potassium 4.9 3.5 - 5.1 mmol/L   Chloride 103 96 - 112 mmol/L   CO2 23 19 - 32 mmol/L   Glucose, Bld 108 (H) 70 - 99 mg/dL   BUN 29 (H) 6 - 23 mg/dL   Creatinine, Ser 1.63 (H) 0.50 - 1.10 mg/dL   Calcium 8.5 8.4 - 10.5 mg/dL   Total Protein 4.5 (L) 6.0 - 8.3 g/dL   Albumin 2.9 (L) 3.5 - 5.2 g/dL   AST 21 0 - 37 U/L   ALT 19 0 - 35 U/L   Alkaline Phosphatase 43 39 - 117 U/L   Total Bilirubin 0.7 0.3 - 1.2 mg/dL   GFR calc non Af Amer 26 (L) >90 mL/min   GFR calc Af Amer 30 (L) >90 mL/min  Comment: (NOTE) The eGFR has been calculated using the CKD EPI equation. This calculation has not been validated in all clinical situations. eGFR's persistently <90 mL/min signify possible Chronic  Kidney Disease.    Anion gap 8 5 - 15  Comprehensive metabolic panel     Status: Abnormal   Collection Time: 03/06/15  8:42 AM  Result Value Ref Range   Sodium 137 135 - 145 mmol/L   Potassium 4.3 3.5 - 5.1 mmol/L   Chloride 107 96 - 112 mmol/L   CO2 22 19 - 32 mmol/L   Glucose, Bld 109 (H) 70 - 99 mg/dL   BUN 24 (H) 6 - 23 mg/dL   Creatinine, Ser 1.26 (H) 0.50 - 1.10 mg/dL   Calcium 8.4 8.4 - 10.5 mg/dL   Total Protein 4.3 (L) 6.0 - 8.3 g/dL   Albumin 2.6 (L) 3.5 - 5.2 g/dL   AST 16 0 - 37 U/L   ALT 12 0 - 35 U/L   Alkaline Phosphatase 41 39 - 117 U/L   Total Bilirubin 0.5 0.3 - 1.2 mg/dL   GFR calc non Af Amer 36 (L) >90 mL/min   GFR calc Af Amer 41 (L) >90 mL/min    Comment: (NOTE) The eGFR has been calculated using the CKD EPI equation. This calculation has not been validated in all clinical situations. eGFR's persistently <90 mL/min signify possible Chronic Kidney Disease.    Anion gap 8 5 - 15  CBC     Status: Abnormal   Collection Time: 03/06/15  8:42 AM  Result Value Ref Range   WBC 10.4 4.0 - 10.5 K/uL   RBC 2.43 (L) 3.87 - 5.11 MIL/uL   Hemoglobin 7.6 (L) 12.0 - 15.0 g/dL   HCT 23.7 (L) 36.0 - 46.0 %   MCV 97.5 78.0 - 100.0 fL   MCH 31.3 26.0 - 34.0 pg   MCHC 32.1 30.0 - 36.0 g/dL   RDW 14.0 11.5 - 15.5 %   Platelets 170 150 - 400 K/uL  Prepare RBC     Status: None   Collection Time: 03/06/15 11:15 AM  Result Value Ref Range   Order Confirmation ORDER PROCESSED BY BLOOD BANK   Type and screen     Status: None (Preliminary result)   Collection Time: 03/06/15 11:15 AM  Result Value Ref Range   ABO/RH(D) A POS    Antibody Screen NEG    Sample Expiration 03/09/2015    Unit Number [id number]    Blood Component Type RED CELLS,LR    Unit division 00    Status of Unit ALLOCATED    Transfusion Status OK TO TRANSFUSE    Crossmatch Result Compatible    Unit Number [id number]    Blood Component Type RED CELLS,LR    Unit division 00    Status of Unit  ALLOCATED    Transfusion Status OK TO TRANSFUSE    Crossmatch Result Compatible   ABO/Rh     Status: None   Collection Time: 03/06/15 11:15 AM  Result Value Ref Range   ABO/RH(D) A POS       Right hip pain/Hip fracture Status post INTRAMEDULLARY (IM) NAIL INTERTROCHANTRIC (Right 4/13 - Hip fracture order set placed Apparently family wants to take the patient home Hospital course complicated by agitation, hypoventilation, hypoxia every night continue tramadol as needed for pain control and minimize narcotics  Possible discharge to SNF tomorrow if hemoglobin is stable  Lovenox for DVT prophylaxis  Toxic encephalopathy Secondary to dementia, sundowning,  UTI, postsurgical confusion Minimize benzodiazepines at night, minimize narcotics  acute blood loss anemia  Hemoglobin drop from 11.5->7.6  We'll transfuse 2 units of packed red blood cells  COPD/hypoxemic last night currently requiring 4 L Chest x-ray negative , will order a VQ scan to rule out PE continue Xopenex and this may be contributing to the patient's anxiety    Essential hypertension Blood pressure soft, Resume low-dose metoprolol and discontinue IV metoprolol - Hydralazine when necessary elevated blood pressure   Dementia without behavioral disturbance , likely sundowning with anxiety episodes on a daily basis at night continue Klonopin and Lexapro , avoid IV benzodiazepines Swallow evaluation recommends Dysphagia 1 (Puree);Nectar-thick liquid     CKD (chronic kidney disease), stage III - We'll hold ARB Continue gentle IV fluids  UTI Cont Rocephin 1 g daily 5 days , day 3 Follow urine culture    Discharge Exam:    Blood pressure 109/77, pulse 77, temperature 98.1 F (36.7 C), temperature source Axillary, resp. rate 21, height 5' 6"  (1.676 m), weight 38.556 kg (85 lb), SpO2 100 %.           Follow-up Information    Schedule an appointment as soon as possible for a visit with  Marybelle Killings, MD.   Specialty:  Orthopedic Surgery   Why:  need return office visit in our clinic 2 weeks postop    Contact information:   [place] [address] [phone]       Signed: Reyne Dumas 03/06/2015, 1:28 PM

## 2015-03-06 NOTE — Progress Notes (Signed)
Patient anxious and stated feeling stated feeling short of breath. Oxygen saturations fluctuated between low 80s to 90s and respirations ranged between 25-30. Patient same symptoms the night before (03/05/2015). Paged T.Callahan,NP at [phone], [phone], and text paged at approximate [phone]. A phone number was given from charge nurse to reach the NP. Was told by NP that something was wrong with the pager system. Was able to notify T.Callahan, NP that pt was very anxious and that respirations were ranging from 25-30 and oxygen saturations were 98-100% on 4L oxygen. Also made T.Callahan,NP aware that this was third night that patient was having episodes of anxiety and the night before patient got Ativan 0.5 IV and this helped patient to rest. Ativan was ordered and given to  Patient. Will continue to monitor.

## 2015-03-06 NOTE — Significant Event (Signed)
Rapid Response Event Note Called per floor RN regarding post hip surgery patient with respiratory distress, ?anxiety related. RN unsure if getting an accurate 02 sat measurement, reading 70-80% on NRB poor wave form Overview: Time Called: 2330 Arrival Time: 2340 Event Type: Respiratory, Neurologic  Initial Focused Assessment: Pt found resting in bed, moaning occasionally, fidgeting with blankets. Pt has baseline dementia, answer orientation questions at this time.  Lung sounds clear. Hip dressing CDI. "Caregiver " Dawn at bedside, irrate and angry states " Dont you know she has dementia! She cant tell you nothin!!"  Interventions: New pulse oximeter probe obtained with great waveform, o2 sat 99-100% on 6 LNC, weaned to 4 LNC. After much difficulty reaching [facility] NP prior to my arrival, cell phone number obtained, provider called and updated. Technical issue with pager system tonight. Ativan 0.5 mg given previous night for the dame symptoms with great improvement, ativan 0.5mg  ordered and given at 0019. Discussed concerns with care with Dawn outside of room. Caregiver concerned that patient is not receiving any medications for pain tonight and has no received any since admission. Explained pain meds given since admit, and [facility] MD choice to d/c narcotics and minimize benzodiazepines overnight due to Pt's lethargy upon MD exam on days.  Tramadol q6 hr ordered and dose given at 0027 following ativan. Discussed with RN the need to assess non verbal communication for pain and use of alternative pain scale.  Pt seems more relaxed, VSS, RRT to follow up tonight  0200-Update-Pt appears to be in pain. T.Callahan NP called and updated. Norco 1 tab ordered and given, Pt resting  Event Summary: Name of Physician Notified: Benedetto Coons. Callahan NP-Paged several times, IT problem with pager, cell phone number obtained     at    Outcome: Stayed in room and stabalized  End time :0030  White, James IvanoffBrooke Leigh Corie Vavra

## 2015-03-06 NOTE — Progress Notes (Signed)
Speech Language Pathology Treatment: Dysphagia  Patient Details Name: Andrea [name] MRN: [id number] DOB: 12-10-1918 Today's Date: 03/06/2015 Time: [id number] SLP Time Calculation (min) (ACUTE ONLY): 8 min  Assessment / Plan / Recommendation Clinical Impression  RN tech assisting pt during lunch as SLP entered. Functional oral manipulation and transit with puree and nectar thick consistencies. Suspect delayed swallow initiation primarily with liquids, no cough, throat clear or wet vocal quality present. No difficulty reported by HydrologistN tech. Continue current diet/liquids and full supervision. Will attempt thin liquid trials next session.   HPI HPI: Andrea [name] is a 81 y.o. female with history of dementia, hypertension, and chronic kidney disease stage III, presenting to the hospital after mechanical fall resulting in right hip fracture. Pt in OR 4/13 for surgical management. Later that evening, nursing staff noted coughing with thin liquids after which pt was kept NPO and swallow evaluation was ordered.     Pertinent Vitals Pain Assessment: No/denies pain  SLP Plan  Continue with current plan of care    Recommendations Diet recommendations: Dysphagia 1 (puree);Nectar-thick liquid Liquids provided via: Cup Medication Administration: Whole meds with puree Supervision: Staff to assist with self feeding;Full supervision/cueing for compensatory strategies Compensations: Slow rate;Small sips/bites Postural Changes and/or Swallow Maneuvers: Seated upright 90 degrees;Upright 30-60 min after meal              Oral Care Recommendations: Oral care BID Follow up Recommendations:  (TBD) Plan: Continue with current plan of care    GO     Royce MacadamiaLitaker, Rosalind Guido Willis 03/06/2015, 1:46 PM  Breck CoonsLisa Willis Lonell FaceLitaker M.Ed [employer]-SLP Pager [phone]

## 2015-03-06 NOTE — Clinical Social Work Note (Signed)
CSW contacted patient's daughter about SNF placement.  Patient's daughter was looking at SNFs and [facility]  [facility] on Saturday once she is medically ready and discharge orders have been received and to have CS[facility] at [phone] once patient is ready for discharge.  Ervin KnackEric R. Dheeraj Hail, MSW, Theresia MajorsLCSWA [phone] 03/06/2015 5:28 PM

## 2015-03-06 NOTE — Progress Notes (Addendum)
[facility] PROGRESS NOTE  Andrea [name] WUJ:[id number] DOB: October 31, 1919 DOA: 03/03/2015 PCP: Thayer Headings, MD  Assessment/Plan: Active Problems:   Essential hypertension   Dementia without behavioral disturbance   CKD (chronic kidney disease), stage III   Right hip pain   Hip fracture   Protein-calorie malnutrition, severe      Right hip pain/Hip fracture Status post INTRAMEDULLARY (IM) NAIL INTERTROCHANTRIC (Right 4/13 - Hip fracture order set placed Apparently family wants to take the patient home Hospital course complicated by agitation, hypoventilation, hypoxia every night  continue tramadol as needed for pain control and minimize narcotics  Possible discharge to SNF tomorrow if hemoglobin is stable  Lovenox for DVT prophylaxis  Toxic encephalopathy Secondary to dementia, sundowning, UTI, postsurgical confusion Minimize benzodiazepines at night, minimize narcotics    acute blood loss anemia  Hemoglobin drop from 11.5->7.6  We'll transfuse 2 units of packed red blood cells  COPD/hypoxemic last night  currently requiring 4 L Chest x-ray negative , will order a VQ scan to rule out  PE  continue Xopenex and this may be contributing to the patient's anxiety    Essential hypertension Blood pressure soft,  Resume low-dose metoprolol and discontinue IV metoprolol - Hydralazine when necessary elevated blood pressure   Dementia without behavioral disturbance  , likely sundowning with anxiety episodes on a daily basis at night  continue Klonopin and Lexapro , avoid IV benzodiazepines Swallow evaluation recommends Dysphagia 1 (Puree);Nectar-thick liquid     CKD (chronic kidney disease), stage III - We'll hold ARB Continue gentle IV fluids  UTI Cont  Rocephin 1 g daily 5 days , day 3 Follow urine culture  Code Status: DO NOT RESUSCITATE Family Communication: family updated about patient's clinical progress, called  Read,Lisa Daughter   [phone]     Disposition Plan:  Anticipate discharge to SNF on Saturday 4/16   Brief narrative: Andrea [name] is a 81 y.o. female with history of dementia, hypertension, and chronic kidney disease stage III Presenting to the hospital after mechanical fall landed on right side. Developed pain at right side subsequently. As a result patient was brought to the ED for further evaluation and recommendations. While in the ED patient has had workup which revealed after CT scan of hip a displaced comminuted fracture of the right femoral lesser trochanter and a nondisplaced fracture of the greater trochanter. Currently suspicion is for occult intertrochanteric fracture. Patient also had CT scan of head given history of fall and there is no acute intracranial abnormality reported   Consultants:  Orthopedics  Procedures:  None  Antibiotics: Cefazolin/ceftriaxone  HPI/Subjective: Somnolent this morning, apparently had a rapid response last night secondary to hypoxia and hypoventilation relieved after receiving IV Ativan  Objective: Filed Vitals:   03/05/15 1600 03/05/15 2321 03/06/15 0622 03/06/15 0800  BP:  106/85 111/46   Pulse:  74 77   Temp:   98.1 F (36.7 C)   TempSrc:   Axillary   Resp: Height:      Weight:      SpO2: 98% 96% 100% 100%    Intake/Output Summary (Last 24 hours) at 03/06/15 1026 Last data filed at 03/06/15 0600  Gross per 24 hour  Intake 1411.25 ml  Output    130 ml  Net 1281.25 ml    Exam:  General: Somnolent Lungs: Clear to auscultation bilaterally without wheezes or crackles Cardiovascular: Regular rate and rhythm without murmur gallop or rub normal S1 and S2 Abdomen: Nontender, nondistended, soft, bowel  sounds positive, no rebound, no ascites, no appreciable mass Extremities: Pain with right hip ROM      Data Reviewed: Basic Metabolic Panel:  Recent Labs Lab 03/03/15 1400 03/04/15 0405 03/05/15 1030 03/06/15 0842  NA 139 137  134* 137  K 5.0 4.6 4.9 4.3  CL 102 103 103 107  CO2 GLUCOSE 95 117* 108* 109*  BUN 26* 29* 29* 24*  CREATININE 1.34* 1.43* 1.63* 1.26*  CALCIUM 10.3 9.5 8.5 8.4    Liver Function Tests:  Recent Labs Lab 03/05/15 1030 03/06/15 0842  AST 21 16  ALT 19 12  ALKPHOS 43 41  BILITOT 0.7 0.5  PROT 4.5* 4.3*  ALBUMIN 2.9* 2.6*   No results for input(s): LIPASE, AMYLASE in the last 168 hours. No results for input(s): AMMONIA in the last 168 hours.  CBC:  Recent Labs Lab 03/03/15 1400 03/04/15 0405 03/06/15 0842  WBC 16.4* 12.7* 10.4  NEUTROABS 11.0*  --   --   HGB 13.1 11.5* 7.6*  HCT 40.0 35.3* 23.7*  MCV 93.9 95.9 97.5  PLT 293 251 170    Cardiac Enzymes: No results for input(s): CKTOTAL, CKMB, CKMBINDEX, TROPONINI in the last 168 hours. BNP (last 3 results) No results for input(s): BNP in the last 8760 hours.  ProBNP (last 3 results)  Recent Labs  09/12/14 0102 10/01/14 0938  PROBNP 9765.0* 1346.0*      CBG: No results for input(s): GLUCAP in the last 168 hours.  No results found for this or any previous visit (from the past 240 hour(s)).   Studies: Dg Chest 1 View  03/04/2015   CLINICAL DATA:  81 year old female with acute respiratory failure. Initial encounter.  EXAM: CHEST  1 VIEW  COMPARISON:  03/03/2015 and earlier.  FINDINGS: Portable AP semi upright view at 0815 hours. Large lung volumes. Mostly chronic appearing bilateral rib fractures (right ninth fracture might be subacute). No pneumothorax. Allowing for portable technique, the lungs are clear. Stable cardiac size and mediastinal contours. Thoracolumbar junction compression fracture again noted.  IMPRESSION: Hyperinflation with no acute cardiopulmonary abnormality identified.   Electronically Signed   By: Odessa Fleming M.D.   On: 03/04/2015 08:21   Dg Chest 1 View  03/03/2015   CLINICAL DATA:  Fall.  Preop.  No chest complaints.  EXAM: CHEST  1 VIEW  COMPARISON:  10/21/2014  FINDINGS: There  is hyperinflation of the lungs compatible with COPD. Heart is upper limits normal in size. Tortuosity of the thoracic aorta with scattered calcifications. Lungs are clear. No effusions. Multiple old healed left rib fractures.  IMPRESSION: Hyperinflation/COPD.  No active disease.   Electronically Signed   By: Charlett Nose M.D.   On: 03/03/2015 13:45   Ct Head Wo Contrast  03/03/2015   CLINICAL DATA:  Head injury after fall.  No loss of consciousness.  EXAM: CT HEAD WITHOUT CONTRAST  CT CERVICAL SPINE WITHOUT CONTRAST  TECHNIQUE: Multidetector CT imaging of the head and cervical spine was performed following the standard protocol without intravenous contrast. Multiplanar CT image reconstructions of the cervical spine were also generated.  COMPARISON:  CT scan of August 25, 2014.  FINDINGS: CT HEAD FINDINGS  Bony calvarium appears intact. Moderate diffuse cortical atrophy is noted. Mild chronic ischemic white matter disease is noted. No mass effect or midline shift is noted. Ventricular size is within normal limits. There is no evidence of mass lesion, hemorrhage or acute infarction.  CT CERVICAL SPINE FINDINGS  No  fracture or significant spondylolisthesis is noted. Moderate degenerative disc disease is noted at C5-6 and C6-7 with anterior osteophyte formation. Visualized lung apices appear normal. Degenerative changes seen involving the posterior facet joints of throughout the cervical spine bilaterally.  IMPRESSION: Moderate diffuse cortical atrophy. Moderate chronic ischemic white matter disease. No acute intracranial abnormality seen.  Multilevel degenerative disc disease is noted. No acute abnormality seen in the cervical spine.   Electronically Signed   By: Lupita Raider, M.D.   On: 03/03/2015 13:53   Ct Cervical Spine Wo Contrast  03/03/2015   CLINICAL DATA:  Head injury after fall.  No loss of consciousness.  EXAM: CT HEAD WITHOUT CONTRAST  CT CERVICAL SPINE WITHOUT CONTRAST  TECHNIQUE: Multidetector CT  imaging of the head and cervical spine was performed following the standard protocol without intravenous contrast. Multiplanar CT image reconstructions of the cervical spine were also generated.  COMPARISON:  CT scan of August 25, 2014.  FINDINGS: CT HEAD FINDINGS  Bony calvarium appears intact. Moderate diffuse cortical atrophy is noted. Mild chronic ischemic white matter disease is noted. No mass effect or midline shift is noted. Ventricular size is within normal limits. There is no evidence of mass lesion, hemorrhage or acute infarction.  CT CERVICAL SPINE FINDINGS  No fracture or significant spondylolisthesis is noted. Moderate degenerative disc disease is noted at C5-6 and C6-7 with anterior osteophyte formation. Visualized lung apices appear normal. Degenerative changes seen involving the posterior facet joints of throughout the cervical spine bilaterally.  IMPRESSION: Moderate diffuse cortical atrophy. Moderate chronic ischemic white matter disease. No acute intracranial abnormality seen.  Multilevel degenerative disc disease is noted. No acute abnormality seen in the cervical spine.   Electronically Signed   By: Lupita Raider, M.D.   On: 03/03/2015 13:53   Ct Hip Right Wo Contrast  03/03/2015   CLINICAL DATA:  81 year old with right hip pain after falling. Evaluate for fracture. History of chronic kidney disease, hypertension and Alzheimer's. Initial encounter.  EXAM: CT OF THE RIGHT HIP WITHOUT CONTRAST  TECHNIQUE: Multidetector CT imaging of the right hip was performed according to the standard protocol. Multiplanar CT image reconstructions were also generated.  COMPARISON:  Radiograph same date.  FINDINGS: Examination is limited to right hip and right hemipelvis. The bones are moderately demineralized.  As demonstrated radiographically, there is a displaced comminuted fracture of the right femoral lesser trochanter. This is consistent with an avulsion fracture, and the iliopsoas tendon inserts on the  avulsed fragment. There is a nondisplaced fracture of the greater trochanter, best seen on the coronal images. No definite displaced intertrochanteric or basicervical fracture identified.  Moderate right hip degenerative changes are present. There is a chondroid lesion inferiorly in the right femoral head which demonstrates no aggressive characteristics. Degenerative changes are also present at the symphysis pubis and right sacroiliac joint. On the sagittal images, there are degenerative changes in the lower coccyx.  There is subcutaneous edema surrounding the right hip without large soft tissue hematoma or joint effusion.  Incidentally noted is a 4.3 cm lipoma dependently within the cecum, possibly related to the ileocecal valve.  IMPRESSION: 1. CT confirms the presence of a displaced comminuted fracture of the right femoral lesser trochanter. In addition, there is a nondisplaced fracture of the greater trochanter. 2. Given these 2 fractures, there is at least moderate probability of an occult intertrochanteric fracture, not demonstrated by this examination. 3. Degenerative changes as described.   Electronically Signed   By: Chrissie Noa  Purcell MoutonVeazey M.D.   On: 03/03/2015 15:19   Dg Chest Port 1 View  03/06/2015   CLINICAL DATA:  Shortness of breath  EXAM: PORTABLE CHEST - 1 VIEW  COMPARISON:  03/05/2015  FINDINGS: Support apparatus and skin fold overlies the chest. Mild enlargement of the cardiomediastinal silhouette is reidentified. Hyperaeration and lucency again suggests emphysema. No new focal pulmonary opacity. No pleural effusion. Healed bilateral rib fractures.  IMPRESSION: No new acute cardiopulmonary process.   Electronically Signed   By: Christiana PellantGretchen  Green M.D.   On: 03/06/2015 09:46   Dg Chest Port 1 View  03/05/2015   CLINICAL DATA:  Shortness of breath. Pain in the neck. Recent hip surgery.  EXAM: PORTABLE CHEST - 1 VIEW  COMPARISON:  03/04/2015  FINDINGS: Chronic pulmonary hyperinflation and apical lucency.  There is no edema, consolidation, effusion, or pneumothorax. Multiple remote left posterior rib fractures. There is also a chronic compression fracture of L1 with advanced height loss.  No cardiomegaly.  Negative aortic and hilar contours.  IMPRESSION: COPD without acute superimposed disease.   Electronically Signed   By: Marnee SpringJonathon  Watts M.D.   On: 03/05/2015 02:47   Dg C-arm 1-60 Min  03/04/2015   CLINICAL DATA:  Fracture fixation.  EXAM: RIGHT FEMUR 2 VIEWS; DG C-ARM 61-120 MIN  COMPARISON:  Radiographs 03/03/2015  FINDINGS: There is an intramedullary gamma nail in the right femur with a proximal dynamic hip screw and a distal interlocking screw. Avulsion fracture of the lesser trochanter is unchanged.  IMPRESSION: Internal fixation of a right hip fracture.   Electronically Signed   By: Rudie MeyerP.  Gallerani M.D.   On: 03/04/2015 17:28   Dg Hip Unilat With Pelvis 2-3 Views Right  03/03/2015   CLINICAL DATA:  Acute right proximal femur pain after fall. Initial encounter.  EXAM: RIGHT HIP (WITH PELVIS) 2-3 VIEWS  COMPARISON:  None.  FINDINGS: Severely displaced fracture of lesser trochanter of proximal right femur is noted. Severe degenerative joint disease of right hip is noted. Mild degenerative joint disease of left hip is noted.  IMPRESSION: Severely displaced avulsion fracture of lesser trochanter of proximal right femur is noted.   Electronically Signed   By: Lupita RaiderJames  Green Jr, M.D.   On: 03/03/2015 13:44   Dg Femur, Min 2 Views Right  03/04/2015   CLINICAL DATA:  Fracture fixation.  EXAM: RIGHT FEMUR 2 VIEWS; DG C-ARM 61-120 MIN  COMPARISON:  Radiographs 03/03/2015  FINDINGS: There is an intramedullary gamma nail in the right femur with a proximal dynamic hip screw and a distal interlocking screw. Avulsion fracture of the lesser trochanter is unchanged.  IMPRESSION: Internal fixation of a right hip fracture.   Electronically Signed   By: Rudie MeyerP.  Gallerani M.D.   On: 03/04/2015 17:28   Dg Femur, Min 2 Views  Right  03/03/2015   CLINICAL DATA:  Pain following fall  EXAM: RIGHT FEMUR 2 VIEWS  COMPARISON:  None.  FINDINGS: Frontal and lateral views were obtained. There is avulsion of the lesser trochanter. No other fracture demonstrated. No dislocation. There is moderate narrowing of the hip and knee joints on the right. No knee joint effusion.  IMPRESSION: Avulsion of the lesser trochanter. No dislocation. Osteoarthritic change in the right hip and knee joints.   Electronically Signed   By: Bretta BangWilliam  Woodruff III M.D.   On: 03/03/2015 13:45    Scheduled Meds: . sodium chloride   Intravenous Once  . cefTRIAXone (ROCEPHIN)  IV  1 g Intravenous Q24H  .  escitalopram  20 mg Oral Daily  . feeding supplement (ENSURE)  1 Container Oral TID BM  . furosemide  40 mg Intravenous Once  . LORazepam      . senna-docusate  1 tablet Oral QHS   Continuous Infusions: . lactated ringers 10 mL/hr at 03/04/15 1446  . 0.9 % sodium chloride with kcl 75 mL/hr at 03/05/15 1643    Active Problems:   Essential hypertension   Dementia without behavioral disturbance   CKD (chronic kidney disease), stage III   Right hip pain   Hip fracture   Protein-calorie malnutrition, severe    Time spent: 40 minutes   [facility]  [facility] Pager [phone]. If 7PM-7AM, please contact night-coverage at [email or web address], password [facility] 03/06/2015, 10:26 AM  LOS: 3 days

## 2015-03-06 NOTE — Progress Notes (Signed)
RN called for update on Pt. Pt resting quietly since 0200 Norco tab. No symptoms of pain. VS to be checked around 0600 and RN to bladder scan Pt to ensure she is not having urinary retention. RN advised to monitor Pt closely and notify myself and provider for worsening changes.

## 2015-03-06 NOTE — Progress Notes (Signed)
Subjective: 2 Days Post-Op Procedure(s) (LRB): INTRAMEDULLARY (IM) NAIL INTERTROCHANTRIC (Right) Patient reports pain as 0 on 0-10 scale.   Sleeping. Per family up most of the night.  Objective: Vital signs in last 24 hours: Temp:  [97.9 F (36.6 C)-99 F (37.2 C)] 98.1 F (36.7 C) (04/15 0622) Pulse Rate:  [68-77] 77 (04/15 0622) Resp:  [18-25] 20 (04/15 0800) BP: (92-111)/(46-85) 111/46 mmHg (04/15 0622) SpO2:  [96 %-100 %] 100 % (04/15 0800)  Intake/Output from previous day: 04/14 0701 - 04/15 0700 In: 1511.3 [P.O.:465; I.V.:996.3; IV Piggyback:50] Out: 205 [Urine:205] Intake/Output this shift:     Recent Labs  03/03/15 1400 03/04/15 0405 03/06/15 0842  HGB 13.1 11.5* 7.6*    Recent Labs  03/04/15 0405 03/06/15 0842  WBC 12.7* 10.4  RBC 3.68* 2.43*  HCT 35.3* 23.7*  PLT 251 170    Recent Labs  03/04/15 0405 03/05/15 1030  NA 137 134*  K 4.6 4.9  CL 103 103  CO2 24 23  BUN 29* 29*  CREATININE 1.43* 1.63*  GLUCOSE 117* 108*  CALCIUM 9.5 8.5    Recent Labs  03/03/15 1400  INR 1.14    PE , dressing dry  Assessment/Plan: 2 Days Post-Op Procedure(s) (LRB): INTRAMEDULLARY (IM) NAIL INTERTROCHANTRIC (Right)  possible transfusion, will let Hospitalist decide.   Manford Sprong C 03/06/2015, 9:39 AM

## 2015-03-06 NOTE — Progress Notes (Signed)
OT Cancellation Note  Patient Details Name: Jaci LazierGeraldine Bewley MRN: [id number] DOB: Mar 23, 1919   Cancelled Treatment:    Chart reviewed.  Pt for discharge to SNF for rehab and has Medicare.  OT eval deferred to SNF.  If disposition changes, or OT eval needed for pre-authorization, please call [phone]  Angelene GiovanniConarpe, [place] M  Seraj Dunnam [place]arpe, OTR/L [phone]  03/06/2015, 12:09 PM

## 2015-03-06 NOTE — Care Management Note (Signed)
  Page 1 of 1   03/06/2015     8:11:55 AM CARE MANAGEMENT NOTE 03/06/2015  Patient:  Andrea [name],Andrea [name]   Account Number:  [id number]  Date Initiated:  03/06/2015  Documentation initiated by:  Ronny FlurryWILE,Bryana Froemming  Subjective/Objective Assessment:     Action/Plan:   Anticipated DC Date:  03/08/2015   Anticipated DC Plan:  SKILLED NURSING FACILITY  In-house referral  Clinical Social Worker         Choice offered to / List presented to:             Status of service:   Medicare Important Message given?  YES (If response is "NO", the following Medicare IM given date fields will be blank) Date Medicare IM given:  03/06/2015 Medicare IM given by:  Ronny FlurryWILE,Hester Joslin Date Additional Medicare IM given:   Additional Medicare IM given by:    Discharge Disposition:    Per UR Regulation:  Reviewed for med. necessity/level of care/duration of stay  If discussed at Long Length of Stay Meetings, dates discussed:    Comments:

## 2015-03-07 DIAGNOSIS — M79609 Pain in unspecified limb: Secondary | ICD-10-CM

## 2015-03-07 DIAGNOSIS — M25551 Pain in right hip: Secondary | ICD-10-CM

## 2015-03-07 DIAGNOSIS — M7989 Other specified soft tissue disorders: Secondary | ICD-10-CM

## 2015-03-07 LAB — COMPREHENSIVE METABOLIC PANEL
ALBUMIN: 2.8 g/dL — AB (ref 3.5–5.2)
ALT: 12 U/L (ref 0–35)
ANION GAP: 11 (ref 5–15)
AST: 17 U/L (ref 0–37)
Alkaline Phosphatase: 47 U/L (ref 39–117)
BUN: 19 mg/dL (ref 6–23)
CHLORIDE: 101 mmol/L (ref 96–112)
CO2: 25 mmol/L (ref 19–32)
Calcium: 8.6 mg/dL (ref 8.4–10.5)
Creatinine, Ser: 1.04 mg/dL (ref 0.50–1.10)
GFR calc Af Amer: 52 mL/min — ABNORMAL LOW (ref >90)
GFR calc non Af Amer: 45 mL/min — ABNORMAL LOW (ref >90)
GLUCOSE: 102 mg/dL — AB (ref 70–99)
POTASSIUM: 3.9 mmol/L (ref 3.5–5.1)
SODIUM: 137 mmol/L (ref 135–145)
Total Bilirubin: 1 mg/dL (ref 0.3–1.2)
Total Protein: 4.9 g/dL — ABNORMAL LOW (ref 6.0–8.3)

## 2015-03-07 LAB — TYPE AND SCREEN
ABO/RH(D): A POS
Antibody Screen: NEGATIVE
UNIT DIVISION: 0
Unit division: 0

## 2015-03-07 LAB — CBC
HEMATOCRIT: 35.3 % — AB (ref 36.0–46.0)
HEMOGLOBIN: 11.4 g/dL — AB (ref 12.0–15.0)
MCH: 30.1 pg (ref 26.0–34.0)
MCHC: 32.3 g/dL (ref 30.0–36.0)
MCV: 93.1 fL (ref 78.0–100.0)
Platelets: 192 10*3/uL (ref 150–400)
RBC: 3.79 MIL/uL — AB (ref 3.87–5.11)
RDW: 15.5 % (ref 11.5–15.5)
WBC: 9.2 10*3/uL (ref 4.0–10.5)

## 2015-03-07 MED ORDER — HYDROCODONE-ACETAMINOPHEN 5-325 MG PO TABS: 1.0000 | ORAL_TABLET | Freq: Four times a day (QID) | ORAL | Status: DC | PRN

## 2015-03-07 MED ORDER — CEPHALEXIN 500 MG PO CAPS: 500.0000 mg | ORAL_CAPSULE | Freq: Two times a day (BID) | ORAL | Status: DC

## 2015-03-07 NOTE — Discharge Summary (Signed)
Physician Discharge Summary  Andrea [name] MRN: [id number] DOB/AGE: 1919-09-10 81 y.o.  PCP: Thressa Sheller, MD   Admit date: 03/03/2015 Discharge date: 03/07/2015  Discharge Diagnoses:     Active Problems:   Essential hypertension   Dementia without behavioral disturbance   CKD (chronic kidney disease), stage III   Right hip pain   Hip fracture   Protein-calorie malnutrition, severe  Follow up recommendations Follow up CBC, CMP  in 5-7 days  Follow up with PCP in 5-7 days     speech therapy recommendations Dysphagia 1 (Puree);Nectar-thick liquid   Liquid Administration via: Cup;No straw Medication Administration: Whole meds with puree Supervision: Staff to assist with self feeding;Full supervision/cueing for compensatory strategies Compensations: Slow rate;Small sips/bites Postural Changes and/or Swallow Maneuvers: Seated upright 90 degrees;Upright 30-60 min after meal     Medication List    STOP taking these medications        losartan 100 MG tablet  Commonly known as:  COZAAR      TAKE these medications        acetaminophen 325 MG tablet  Commonly known as:  TYLENOL  Take 2 tablets (650 mg total) by mouth every 6 (six) hours as needed for mild pain or moderate pain (or Fever >/= 101).     albuterol 108 (90 BASE) MCG/ACT inhaler  Commonly known as:  PROVENTIL HFA;VENTOLIN HFA  Inhale 1-2 puffs into the lungs every 6 (six) hours as needed for wheezing or shortness of breath.     ANORO ELLIPTA 62.5-25 MCG/INH Aepb  Generic drug:  Umeclidinium-Vilanterol  Inhale 1 puff into the lungs daily.     aspirin EC 81 MG tablet  Take 1 tablet (81 mg total) by mouth daily.     bisacodyl 10 MG suppository  Commonly known as:  DULCOLAX  Place 1 suppository (10 mg total) rectally daily as needed for moderate constipation or severe constipation.     buPROPion 300 MG 24 hr tablet  Commonly known as:  WELLBUTRIN XL  Take 300 mg by mouth every morning.      clonazePAM 0.25 MG disintegrating tablet  Commonly known as:  KLONOPIN  Take 1 tablet (0.25 mg total) by mouth 2 (two) times daily as needed (anxiety).     enoxaparin 40 MG/0.4ML injection  Commonly known as:  LOVENOX  Inject 0.2 mLs (20 mg total) into the skin daily.     escitalopram 20 MG tablet  Commonly known as:  LEXAPRO  Take 1 tablet (20 mg total) by mouth daily.     feeding supplement (ENSURE) Pudg  Take 1 Container by mouth 3 (three) times daily between meals.     food thickener Powd  Commonly known as:  THICK IT  30 containers     HYDROcodone-acetaminophen 5-325 MG per tablet  Commonly known as:  NORCO  Take 1 tablet by mouth every 6 (six) hours as needed for moderate pain.     levalbuterol 1.25 MG/0.5ML nebulizer solution  Commonly known as:  XOPENEX  Take 1.25 mg by nebulization every 6 (six) hours as needed for wheezing or shortness of breath.     metoprolol tartrate 25 MG tablet  Commonly known as:  LOPRESSOR  Take 0.5 tablets (12.5 mg total) by mouth 2 (two) times daily.     senna-docusate 8.6-50 MG per tablet  Commonly known as:  Senokot-S  Take 1 tablet by mouth at bedtime.     traMADol 50 MG tablet  Commonly known as:  Veatrice Bourbon  Take 1 tablet (50 mg total) by mouth every 6 (six) hours as needed for moderate pain.        Discharge Condition:    Disposition: SNF    Consults:  Orthopedics    Significant Diagnostic Studies: Dg Chest 1 View  03/04/2015   CLINICAL DATA:  81 year old female with acute respiratory failure. Initial encounter.  EXAM: CHEST  1 VIEW  COMPARISON:  03/03/2015 and earlier.  FINDINGS: Portable AP semi upright view at 0815 hours. Large lung volumes. Mostly chronic appearing bilateral rib fractures (right ninth fracture might be subacute). No pneumothorax. Allowing for portable technique, the lungs are clear. Stable cardiac size and mediastinal contours. Thoracolumbar junction compression fracture again noted.  IMPRESSION:  Hyperinflation with no acute cardiopulmonary abnormality identified.   Electronically Signed   By: Genevie Ann M.D.   On: 03/04/2015 08:21   Dg Chest 1 View  03/03/2015   CLINICAL DATA:  Fall.  Preop.  No chest complaints.  EXAM: CHEST  1 VIEW  COMPARISON:  10/21/2014  FINDINGS: There is hyperinflation of the lungs compatible with COPD. Heart is upper limits normal in size. Tortuosity of the thoracic aorta with scattered calcifications. Lungs are clear. No effusions. Multiple old healed left rib fractures.  IMPRESSION: Hyperinflation/COPD.  No active disease.   Electronically Signed   By: Rolm Baptise M.D.   On: 03/03/2015 13:45   Ct Head Wo Contrast  03/03/2015   CLINICAL DATA:  Head injury after fall.  No loss of consciousness.  EXAM: CT HEAD WITHOUT CONTRAST  CT CERVICAL SPINE WITHOUT CONTRAST  TECHNIQUE: Multidetector CT imaging of the head and cervical spine was performed following the standard protocol without intravenous contrast. Multiplanar CT image reconstructions of the cervical spine were also generated.  COMPARISON:  CT scan of August 25, 2014.  FINDINGS: CT HEAD FINDINGS  Bony calvarium appears intact. Moderate diffuse cortical atrophy is noted. Mild chronic ischemic white matter disease is noted. No mass effect or midline shift is noted. Ventricular size is within normal limits. There is no evidence of mass lesion, hemorrhage or acute infarction.  CT CERVICAL SPINE FINDINGS  No fracture or significant spondylolisthesis is noted. Moderate degenerative disc disease is noted at C5-6 and C6-7 with anterior osteophyte formation. Visualized lung apices appear normal. Degenerative changes seen involving the posterior facet joints of throughout the cervical spine bilaterally.  IMPRESSION: Moderate diffuse cortical atrophy. Moderate chronic ischemic white matter disease. No acute intracranial abnormality seen.  Multilevel degenerative disc disease is noted. No acute abnormality seen in the cervical spine.    Electronically Signed   By: Marijo Conception, M.D.   On: 03/03/2015 13:53   Ct Cervical Spine Wo Contrast  03/03/2015   CLINICAL DATA:  Head injury after fall.  No loss of consciousness.  EXAM: CT HEAD WITHOUT CONTRAST  CT CERVICAL SPINE WITHOUT CONTRAST  TECHNIQUE: Multidetector CT imaging of the head and cervical spine was performed following the standard protocol without intravenous contrast. Multiplanar CT image reconstructions of the cervical spine were also generated.  COMPARISON:  CT scan of August 25, 2014.  FINDINGS: CT HEAD FINDINGS  Bony calvarium appears intact. Moderate diffuse cortical atrophy is noted. Mild chronic ischemic white matter disease is noted. No mass effect or midline shift is noted. Ventricular size is within normal limits. There is no evidence of mass lesion, hemorrhage or acute infarction.  CT CERVICAL SPINE FINDINGS  No fracture or significant spondylolisthesis is noted. Moderate degenerative disc disease is noted at  C5-6 and C6-7 with anterior osteophyte formation. Visualized lung apices appear normal. Degenerative changes seen involving the posterior facet joints of throughout the cervical spine bilaterally.  IMPRESSION: Moderate diffuse cortical atrophy. Moderate chronic ischemic white matter disease. No acute intracranial abnormality seen.  Multilevel degenerative disc disease is noted. No acute abnormality seen in the cervical spine.   Electronically Signed   By: Marijo Conception, M.D.   On: 03/03/2015 13:53   Nm Pulmonary Perfusion  03/06/2015   CLINICAL DATA:  Shortness of breath. Evaluate for pulmonary embolism. Patient unable tolerate the ventilation portion of the exam.  EXAM: NUCLEAR MEDICINE PERFUSION LUNG SCAN  TECHNIQUE: Perfusion images were obtained in multiple projections after intravenous injection of radiopharmaceutical.  RADIOPHARMACEUTICALS:  Three mCi Tc-63mMAA  COMPARISON:  Current chest radiograph  FINDINGS: There are no segmental perfusion abnormalities to  suggest pulmonary thromboembolism. There is mild differential profusion with less activity noted in the left lung than on the right.  IMPRESSION: No segmental perfusion defect is seen to suggest pulmonary thromboembolism.   Electronically Signed   By: DLajean ManesM.D.   On: 03/06/2015 18:09   Ct Hip Right Wo Contrast  03/03/2015   CLINICAL DATA:  81year old with right hip pain after falling. Evaluate for fracture. History of chronic kidney disease, hypertension and Alzheimer's. Initial encounter.  EXAM: CT OF THE RIGHT HIP WITHOUT CONTRAST  TECHNIQUE: Multidetector CT imaging of the right hip was performed according to the standard protocol. Multiplanar CT image reconstructions were also generated.  COMPARISON:  Radiograph same date.  FINDINGS: Examination is limited to right hip and right hemipelvis. The bones are moderately demineralized.  As demonstrated radiographically, there is a displaced comminuted fracture of the right femoral lesser trochanter. This is consistent with an avulsion fracture, and the iliopsoas tendon inserts on the avulsed fragment. There is a nondisplaced fracture of the greater trochanter, best seen on the coronal images. No definite displaced intertrochanteric or basicervical fracture identified.  Moderate right hip degenerative changes are present. There is a chondroid lesion inferiorly in the right femoral head which demonstrates no aggressive characteristics. Degenerative changes are also present at the symphysis pubis and right sacroiliac joint. On the sagittal images, there are degenerative changes in the lower coccyx.  There is subcutaneous edema surrounding the right hip without large soft tissue hematoma or joint effusion.  Incidentally noted is a 4.3 cm lipoma dependently within the cecum, possibly related to the ileocecal valve.  IMPRESSION: 1. CT confirms the presence of a displaced comminuted fracture of the right femoral lesser trochanter. In addition, there is a  nondisplaced fracture of the greater trochanter. 2. Given these 2 fractures, there is at least moderate probability of an occult intertrochanteric fracture, not demonstrated by this examination. 3. Degenerative changes as described.   Electronically Signed   By: WRichardean SaleM.D.   On: 03/03/2015 15:19   Dg Chest Port 1 View  03/06/2015   CLINICAL DATA:  Shortness of breath  EXAM: PORTABLE CHEST - 1 VIEW  COMPARISON:  03/05/2015  FINDINGS: Support apparatus and skin fold overlies the chest. Mild enlargement of the cardiomediastinal silhouette is reidentified. Hyperaeration and lucency again suggests emphysema. No new focal pulmonary opacity. No pleural effusion. Healed bilateral rib fractures.  IMPRESSION: No new acute cardiopulmonary process.   Electronically Signed   By: GConchita ParisM.D.   On: 03/06/2015 09:46   Dg Chest Port 1 View  03/05/2015   CLINICAL DATA:  Shortness of breath. Pain in  the neck. Recent hip surgery.  EXAM: PORTABLE CHEST - 1 VIEW  COMPARISON:  03/04/2015  FINDINGS: Chronic pulmonary hyperinflation and apical lucency. There is no edema, consolidation, effusion, or pneumothorax. Multiple remote left posterior rib fractures. There is also a chronic compression fracture of L1 with advanced height loss.  No cardiomegaly.  Negative aortic and hilar contours.  IMPRESSION: COPD without acute superimposed disease.   Electronically Signed   By: Monte Fantasia M.D.   On: 03/05/2015 02:47   Dg C-arm 1-60 Min  03/04/2015   CLINICAL DATA:  Fracture fixation.  EXAM: RIGHT FEMUR 2 VIEWS; DG C-ARM 61-120 MIN  COMPARISON:  Radiographs 03/03/2015  FINDINGS: There is an intramedullary gamma nail in the right femur with a proximal dynamic hip screw and a distal interlocking screw. Avulsion fracture of the lesser trochanter is unchanged.  IMPRESSION: Internal fixation of a right hip fracture.   Electronically Signed   By: Marijo Sanes M.D.   On: 03/04/2015 17:28   Dg Hip Unilat With Pelvis 2-3  Views Right  03/03/2015   CLINICAL DATA:  Acute right proximal femur pain after fall. Initial encounter.  EXAM: RIGHT HIP (WITH PELVIS) 2-3 VIEWS  COMPARISON:  None.  FINDINGS: Severely displaced fracture of lesser trochanter of proximal right femur is noted. Severe degenerative joint disease of right hip is noted. Mild degenerative joint disease of left hip is noted.  IMPRESSION: Severely displaced avulsion fracture of lesser trochanter of proximal right femur is noted.   Electronically Signed   By: Marijo Conception, M.D.   On: 03/03/2015 13:44   Dg Femur, Min 2 Views Right  03/04/2015   CLINICAL DATA:  Fracture fixation.  EXAM: RIGHT FEMUR 2 VIEWS; DG C-ARM 61-120 MIN  COMPARISON:  Radiographs 03/03/2015  FINDINGS: There is an intramedullary gamma nail in the right femur with a proximal dynamic hip screw and a distal interlocking screw. Avulsion fracture of the lesser trochanter is unchanged.  IMPRESSION: Internal fixation of a right hip fracture.   Electronically Signed   By: Marijo Sanes M.D.   On: 03/04/2015 17:28   Dg Femur, Min 2 Views Right  03/03/2015   CLINICAL DATA:  Pain following fall  EXAM: RIGHT FEMUR 2 VIEWS  COMPARISON:  None.  FINDINGS: Frontal and lateral views were obtained. There is avulsion of the lesser trochanter. No other fracture demonstrated. No dislocation. There is moderate narrowing of the hip and knee joints on the right. No knee joint effusion.  IMPRESSION: Avulsion of the lesser trochanter. No dislocation. Osteoarthritic change in the right hip and knee joints.   Electronically Signed   By: Lowella Grip III M.D.   On: 03/03/2015 13:45      Microbiology: Recent Results (from the past 240 hour(s))  Urine culture     Status: None   Collection Time: 03/05/15  2:50 PM  Result Value Ref Range Status   Specimen Description URINE, CATHETERIZED  Final   Special Requests NONE  Final   Colony Count NO GROWTH Performed at [facility]   Final   Culture NO  GROWTH Performed at [facility]   Final   Report Status 03/06/2015 FINAL  Final     Labs: Results for orders placed or performed during the hospital encounter of 03/03/15 (from the past 48 hour(s))  Urine culture     Status: None   Collection Time: 03/05/15  2:50 PM  Result Value Ref Range   Specimen Description URINE, CATHETERIZED  Special Requests NONE    Colony Count NO GROWTH Performed at [facility]     Culture NO GROWTH Performed at [facility]     Report Status 03/06/2015 FINAL   Comprehensive metabolic panel     Status: Abnormal   Collection Time: 03/06/15  8:42 AM  Result Value Ref Range   Sodium 137 135 - 145 mmol/L   Potassium 4.3 3.5 - 5.1 mmol/L   Chloride 107 96 - 112 mmol/L   CO2 22 19 - 32 mmol/L   Glucose, Bld 109 (H) 70 - 99 mg/dL   BUN 24 (H) 6 - 23 mg/dL   Creatinine, Ser 1.26 (H) 0.50 - 1.10 mg/dL   Calcium 8.4 8.4 - 10.5 mg/dL   Total Protein 4.3 (L) 6.0 - 8.3 g/dL   Albumin 2.6 (L) 3.5 - 5.2 g/dL   AST 16 0 - 37 U/L   ALT 12 0 - 35 U/L   Alkaline Phosphatase 41 39 - 117 U/L   Total Bilirubin 0.5 0.3 - 1.2 mg/dL   GFR calc non Af Amer 36 (L) >90 mL/min   GFR calc Af Amer 41 (L) >90 mL/min    Comment: (NOTE) The eGFR has been calculated using the CKD EPI equation. This calculation has not been validated in all clinical situations. eGFR's persistently <90 mL/min signify possible Chronic Kidney Disease.    Anion gap 8 5 - 15  CBC     Status: Abnormal   Collection Time: 03/06/15  8:42 AM  Result Value Ref Range   WBC 10.4 4.0 - 10.5 K/uL   RBC 2.43 (L) 3.87 - 5.11 MIL/uL   Hemoglobin 7.6 (L) 12.0 - 15.0 g/dL   HCT 23.7 (L) 36.0 - 46.0 %   MCV 97.5 78.0 - 100.0 fL   MCH 31.3 26.0 - 34.0 pg   MCHC 32.1 30.0 - 36.0 g/dL   RDW 14.0 11.5 - 15.5 %   Platelets 170 150 - 400 K/uL  Prepare RBC     Status: None   Collection Time: 03/06/15 11:15 AM  Result Value Ref Range   Order Confirmation ORDER PROCESSED BY BLOOD  BANK   Type and screen     Status: None   Collection Time: 03/06/15 11:15 AM  Result Value Ref Range   ABO/RH(D) A POS    Antibody Screen NEG    Sample Expiration 03/09/2015    Unit Number [id number]    Blood Component Type RED CELLS,LR    Unit division 00    Status of Unit ISSUED,FINAL    Transfusion Status OK TO TRANSFUSE    Crossmatch Result Compatible    Unit Number [id number]    Blood Component Type RED CELLS,LR    Unit division 00    Status of Unit ISSUED,FINAL    Transfusion Status OK TO TRANSFUSE    Crossmatch Result Compatible   ABO/Rh     Status: None   Collection Time: 03/06/15 11:15 AM  Result Value Ref Range   ABO/RH(D) A POS   CBC     Status: Abnormal   Collection Time: 03/07/15  5:08 AM  Result Value Ref Range   WBC 9.2 4.0 - 10.5 K/uL   RBC 3.79 (L) 3.87 - 5.11 MIL/uL   Hemoglobin 11.4 (L) 12.0 - 15.0 g/dL    Comment: POST TRANSFUSION SPECIMEN   HCT 35.3 (L) 36.0 - 46.0 %   MCV 93.1 78.0 - 100.0 fL   MCH 30.1 26.0 - 34.0 pg  MCHC 32.3 30.0 - 36.0 g/dL   RDW 15.5 11.5 - 15.5 %   Platelets 192 150 - 400 K/uL  Comprehensive metabolic panel     Status: Abnormal   Collection Time: 03/07/15  5:08 AM  Result Value Ref Range   Sodium 137 135 - 145 mmol/L   Potassium 3.9 3.5 - 5.1 mmol/L   Chloride 101 96 - 112 mmol/L   CO2 25 19 - 32 mmol/L   Glucose, Bld 102 (H) 70 - 99 mg/dL   BUN 19 6 - 23 mg/dL   Creatinine, Ser 1.04 0.50 - 1.10 mg/dL   Calcium 8.6 8.4 - 10.5 mg/dL   Total Protein 4.9 (L) 6.0 - 8.3 g/dL   Albumin 2.8 (L) 3.5 - 5.2 g/dL   AST 17 0 - 37 U/L   ALT 12 0 - 35 U/L   Alkaline Phosphatase 47 39 - 117 U/L   Total Bilirubin 1.0 0.3 - 1.2 mg/dL   GFR calc non Af Amer 45 (L) >90 mL/min   GFR calc Af Amer 52 (L) >90 mL/min    Comment: (NOTE) The eGFR has been calculated using the CKD EPI equation. This calculation has not been validated in all clinical situations. eGFR's persistently <90 mL/min signify possible Chronic  Kidney Disease.    Anion gap 11 5 - 15     HPI :81 y.o. female with history of dementia, hypertension, and chronic kidney disease stage III Presenting to the hospital after mechanical fall landed on right side. Developed pain at right side subsequently. As a result patient was brought to the ED for further evaluation and recommendations. While in the ED patient has had workup which revealed after CT scan of hip a displaced comminuted fracture of the right femoral lesser trochanter and a nondisplaced fracture of the greater trochanter. Currently suspicion is for occult intertrochanteric fracture. Patient also had CT scan of head given history of fall and there is no acute intracranial abnormality reported   HOSPITAL COURSE:   Right hip pain/Hip fracture Days Post-Op Procedure(s) (LRB): INTRAMEDULLARY (IM) NAIL INTERTROCHANTRIC (Right) Patient reports pain as moderate.    Lovenox for DVT prophylaxis for one month  COPD/hypoxemic last night Stable on 2 L Chest x-ray negative   Essential hypertension  blood pressure soft, metoprolol decreased , Cozaar discontinued     Dementia without behavioral disturbance - Stable continue home regimen Swallow evaluation  Recommend Dysphagia 1 (Puree);Nectar-thick liquid    Speech therapy recommendations as above   CKD (chronic kidney disease), stage III - We'll hold ARB - Stable kidney function ,  1.63 upon admission, 1.04 prior to discharge   UTI  treated with Rocephin times 4 days , switched to Keflex for another 3 days  urine culture no growth so far    Discharge Exam:    Blood pressure 94/68, pulse 55, temperature 97.8 F (36.6 C), temperature source Oral, resp. rate 17, height _0  (1.676 m), weight 38.556 kg (85 lb), SpO2 100 %. General: No acute respiratory distress Lungs: Clear to auscultation bilaterally without wheezes or crackles Cardiovascular: Regular rate and rhythm without murmur gallop or rub normal S1 and S2 Abdomen:  Nontender, nondistended, soft, bowel sounds positive, no rebound, no ascites, no appreciable mass Extremities: Pain with right hip ROM        Discharge Instructions    Diet - low sodium heart healthy    Complete by:  As directed      Diet - low sodium heart healthy  Complete by:  As directed      Increase activity slowly    Complete by:  As directed      Increase activity slowly    Complete by:  As directed            Follow-up Information    Schedule an appointment as soon as possible for a visit with Marybelle Killings, MD.   Specialty:  Orthopedic Surgery   Why:  need return office visit in our clinic 2 weeks postop    Contact information:   [place] [address] [phone]       Signed: Reyne Dumas 03/07/2015, 10:53 AM

## 2015-03-07 NOTE — Progress Notes (Signed)
Subjective: 3 Days Post-Op Procedure(s) (LRB): INTRAMEDULLARY (IM) NAIL INTERTROCHANTRIC (Right) Patient reports pain as moderate.  Did get transfusion yesterday and H/H responded well.  Vitals stable.  Objective: Vital signs in last 24 hours: Temp:  [97.8 F (36.6 C)-98.5 F (36.9 C)] 97.8 F (36.6 C) (04/16 0458) Pulse Rate:  [55-80] 55 (04/16 0458) Resp:  [16-21] 17 (04/16 0458) BP: (94-157)/(44-88) 94/68 mmHg (04/16 0458) SpO2:  [99 %-100 %] 100 % (04/16 0458)  Intake/Output from previous day: 04/15 0701 - 04/16 0700 In: 3120 [P.O.:600; I.V.:1800; Blood:670; IV Piggyback:50] Out: 3250 [Urine:3250] Intake/Output this shift:     Recent Labs  03/06/15 0842 03/07/15 0508  HGB 7.6* 11.4*    Recent Labs  03/06/15 0842 03/07/15 0508  WBC 10.4 9.2  RBC 2.43* 3.79*  HCT 23.7* 35.3*  PLT 170 192    Recent Labs  03/06/15 0842 03/07/15 0508  NA 137 137  K 4.3 3.9  CL 107 101  CO2 22 25  BUN 24* 19  CREATININE 1.26* 1.04  GLUCOSE 109* 102*  CALCIUM 8.4 8.6   No results for input(s): LABPT, INR in the last 72 hours.  Dorsiflexion/Plantar flexion intact Incision: dressing C/D/I No cellulitis present Compartment soft  Assessment/Plan: 3 Days Post-Op Procedure(s) (LRB): INTRAMEDULLARY (IM) NAIL INTERTROCHANTRIC (Right) Up with therapy Discharge to SNF  [facility] HospitalBLACKMAN,CHRISTOPHER Y 03/07/2015, 7:56 AM

## 2015-03-07 NOTE — Progress Notes (Signed)
VASCULAR LAB PRELIMINARY  PRELIMINARY  PRELIMINARY  PRELIMINARY  Bilateral lower extremity venous Dopplers completed.    Preliminary report:  There is no DVT or SVT noted in the bilateral lower extremities.   Tawanna Funk, RVT 03/07/2015, 10:27 AM

## 2015-03-07 NOTE — Progress Notes (Signed)
Ambulance transport arranged on behalf of pt.  Message left for daughter re: transport.  Caregiver, Clydie BraunKaren (at bedside), agreeable to d/c plan.

## 2015-03-11 ENCOUNTER — Ambulatory Visit: Payer: PRIVATE HEALTH INSURANCE | Admitting: Family Medicine

## 2015-03-19 ENCOUNTER — Ambulatory Visit: Payer: PRIVATE HEALTH INSURANCE

## 2015-05-21 NOTE — Patient Outreach (Signed)
[facility] Care Management  05/21/2015  Andrea LazierGeraldine [name] 07-Sep-1919 [id number]   Referral from High Risk List, assigned Colleen CanLinda Manning, RN to outreach.  Corrie MckusickLisa O. Sharlee BlewMoore, [name] [facility] CM Assistant Phone: [phone] Fax: [phone]

## 2015-06-03 ENCOUNTER — Other Ambulatory Visit: Payer: Self-pay | Admitting: *Deleted

## 2015-06-03 NOTE — Patient Outreach (Signed)
[facility]) Care Management  06/03/2015  Andrea LazierGeraldine [name] 1919-09-24 [id number]   High Risk referral: Telephone call to patient;  call was answered by daughter-Andrea [name] who is healthcare power of attorney. States she takes all of patient's calls because she has dementia. Advised healthcare power of attorney of reason for call and of [facility] care [facility] services.   Daughter states patient is currently being cared for by personal assistants [place]. Daughter states that she and personal assistants manage the patient's medications and makes sure she is taking the medications as prescribed by her doctors. Daughter understands the importance of medication adherence. Daughter is taking patient to MD appointments as scheduled.   States there are no problems getting prescriptions filled. States patient is able to walk and does not use walker or cane. States patient has had hip fracture resulting from fall in the past , so is watched closely knowing that she is a falls risk.   States Advanced Directives are in place.   . Meals are prepared for patient and she is able to feed herself.   Daughter states her medical conditions are currently under control and that if she needs specialists she  is able to make appointments without problems.   [facility] offered to caregiver/daughter/healthcare power of attorney. Daughter declined services voicing that she did not feel she needed community case management or disease management. Thanked RN CM for call..  Plan: close out case; send MD closure letter.  Andrea CanLinda Shannon Kirkendall, RN BSN CCM Care Management Coordinator [facility] Care [facility]  [phone]

## 2015-06-04 ENCOUNTER — Encounter: Payer: Self-pay | Admitting: *Deleted

## 2015-06-08 NOTE — Patient Outreach (Signed)
[facility]) Care Management  06/08/2015  Andrea LazierGeraldine [name] [birth year] [id number]   Notification from Colleen CanLinda Manning, RN to close case due to patient refused [facility] services.  Andrea MckusickLisa O. Sharlee [name], [name] [facility] Care Management [facility] CM Assistant Phone: [phone] Fax: [phone]

## 2015-06-30 ENCOUNTER — Encounter: Payer: Self-pay | Admitting: Family Medicine

## 2015-06-30 ENCOUNTER — Ambulatory Visit (INDEPENDENT_AMBULATORY_CARE_PROVIDER_SITE_OTHER): Payer: Medicare Other | Admitting: Family Medicine

## 2015-06-30 VITALS — BP 138/83 | HR 58 | Temp 97.5°F

## 2015-06-30 DIAGNOSIS — F0390 Unspecified dementia without behavioral disturbance: Secondary | ICD-10-CM | POA: Diagnosis present

## 2015-06-30 NOTE — Progress Notes (Signed)
  Patient name: Andrea [name] MRN [id number]  Date of birth: Sep 18, 1919  CC & HPI:  Andrea [name] is a 81 y.o. female presenting today for agitation, hallucinations, and crying spells.  Daughter reports that her mother has had increased difficulties with mood since discharge from rehabilitation facility.  She often has multiple crying spells daily that are not relieved with Klonopin.  Also reports agitation with sitters and care providers, as well as delusion that he husband and brothers are alive and visit with her.  Even though she has 24-hour care she is often found down on the ground after crawling out of bed.  Daughter reports the symptoms were managed in the past with Xanax, but but has been getting worse and do not seem to be responding to Klonopin.  She was placed on Requip several months ago for excessive lower leg movement during sleep; however, this has not helped reduce her symptoms.  She has intermittent problems with constipation that is managed with Mira lax.   Andrea [name] denies any specific complaints or current pain.  Denies chest pain, shortness of breath, coughing, fever, dysuria, constipation, or bedsores.  She becomes tearful and states she wished her brothers were still around.  Admits to feeling sad and depressed.  Reports difficulty with any movement and requires assistance.   ROS: See HPI  Medical & Surgical Hx:  Reviewed  Medications & Allergies: Reviewed  Social History: Reviewed:   Objective Findings:  Vitals: BP 138/83 mmHg  Pulse 58  Temp(Src) 97.5 F (36.4 C) (Oral)  Wt   Gen: NAD CV: RRR w/o m/r/g, pulses +2 b/l Resp: CTAB w/ normal respiratory effort GI: SNTND, bladder nonpalpable MSK: Normal tone; no rigidity; able to stand only with assistance Psych: Depressed affect and mood; normal speech; normal thought content Neuro: A&O; PERRLA; EOMI; CN 3-12 intact; Gross Sensory & Motor intact  Assessment & Plan:   Please See Problem Focused Assessment &  Plan

## 2015-06-30 NOTE — Assessment & Plan Note (Signed)
Labile mood, delusions, agitation, concerning for end-stage dementia.  No current neurological findings or signs of infections.  - Stop Requip - Increase Seroquel to  am,  pm - No changes made to Klonopin today - palliative care has already been consulted and supposed to do an assessment this week.  - Discussed Hospice care with daughter who is amendable if she qualifies  - Daughter declines blood work at this visit. She will call to report changes in behavior with medication adjustment and palliative care recommendations at the end of the week

## 2015-06-30 NOTE — Patient Instructions (Signed)
It was great seeing you today.   1. Increase Seroquel to 50 mg every morning and 25 mg every night 2. Stop Requip 3. Discuss Hospice with the palliative care doctors   Please bring all your medications to every doctors visit  Sign up for My Chart to have easy access to your labs results, and communication with your Primary care physician.  Next Appointment  Please call Dr Gayla Doss on Friday to let me know how the medication changes are working.    I look forward to talking with you again at our next visit. If you have any questions or concerns before then, please call the clinic at [phone].  Take Care,   Dr Wenda Low

## 2015-07-10 ENCOUNTER — Telehealth: Payer: Self-pay | Admitting: Family Medicine

## 2015-07-10 NOTE — Telephone Encounter (Signed)
Andrea [name] called to ask about email she sent to you.  Need you to contact her back before end of clinic.

## 2015-07-13 ENCOUNTER — Other Ambulatory Visit: Payer: Self-pay | Admitting: Family Medicine

## 2015-07-13 DIAGNOSIS — F0390 Unspecified dementia without behavioral disturbance: Secondary | ICD-10-CM

## 2015-07-13 MED ORDER — MELATONIN 3 MG PO TABS: 3.0000 | ORAL_TABLET | Freq: Every day | ORAL | Status: DC

## 2015-07-13 MED ORDER — HALOPERIDOL 0.5 MG PO TABS: 0.5000 mg | ORAL_TABLET | Freq: Three times a day (TID) | ORAL | Status: DC | PRN

## 2015-07-13 NOTE — Telephone Encounter (Signed)
Called and discussed Ms. Dubray with daughter.  She reports worsening of her nocturnal agitation, leg movement and attempts to get out of bed after discontinuing Requip; she restarted Requip Sunday with some improvement in the symptoms.  She is now given Seroquel 25 mg 3 times a day, which helps manage her symptoms somewhat.  However, she is still having extreme behavioral difficulties controlling agitation, hallucination, and attempts to get out of bed.  She is currently taking Klonopin 0.5 mg 2 times a day.  She was seen by out of care team, but daughter has not received any official recommendations.  Daughter continues to have trouble providing 24-hour care.  She is at risk for further falls given her recent hip fracture and continued agitation and attempts at ambulation.  - Advised discontinuing Requip - Start melatonin 3 mg daily at bedtime - Start Haldol 0.5 mg daily at bedtime; an additional 0.5 mg twice a day when necessary - Advised continuing Klonopin 0.5 mg twice a day; hope to wean this, but will not just at this time due to other medication adjustments - Referred for hospice evaluation; I suspect she has end-stage dementia and life expectancy is likely less than 6 months - Asked daughter to call tomorrow to report on symptoms with these medication changes

## 2015-07-14 ENCOUNTER — Telehealth: Payer: Self-pay | Admitting: Family Medicine

## 2015-07-14 NOTE — Telephone Encounter (Signed)
Josh, a Publishing rights manager, is calling on behalf of the pt and the pt's daughter and would like to know how he can help, as he received a voicemail from the pt's daughter instructing him to call us here. He may be reached at [phone]. Please advise, Thank you,  Dorothey Baseman, ASA

## 2015-07-14 NOTE — Telephone Encounter (Signed)
CSW spoke with daughter this morning regarding the hospice referral placed by PCP. Daughter explained that pt has already had a hospice evaluation and is unsure as to what the recommendations are and what the next steps are. She informed CSW that she requested for the NP from hospice to call pts PCP to discuss pts recent Hospice eval and recommendations. Daughter is also potentially interested in exploring Dementia locked units for pt as she feels pt is becoming extremely difficult to manage at home. CSW will contact daughter back with a list of agencies.  Theresia Bough, MSW, LCSW [phone]

## 2015-07-21 ENCOUNTER — Telehealth: Payer: Self-pay | Admitting: Family Medicine

## 2015-07-21 NOTE — Telephone Encounter (Signed)
Called and spoke with daughter, Andrea [name] concerning her mother's behavior.  She reports some improvement in agitation with starting Haldol 0.5 mg at night.  She has not had to use Haldol during the day.  She has started melatonin 3 mg daily at bedtime, and discontinue Requip last Tuesday.  She continues with Klonopin twice a day.  Initially these changes she improved and had more restful sleep.  However, in the few nights she has increase in her leg movement and now has superficial abrasion on her foot from hitting the bed rails.  She has some minor swelling around this area without redness, but is still able to ambulate and denies pain.  - Advised increasing Haldol to 1 mg daily at bedtime; she will call in 2 days to report how this is working - Advise continued to hold Requip - Daughter reports palliative care nurse contacted her and said hospice is not needed at this time and recommended aromatherapy.  Will attempt to contact hospice for further clarification

## 2015-07-21 NOTE — Telephone Encounter (Signed)
Daughter called to discuss the change in medication with the doctor. jw

## 2015-07-21 NOTE — Telephone Encounter (Signed)
Spoke with daughter [name] and she expressed that patient has been taken off her requip for the 2nd time and feels like she needs it back.  She understands that it was discontinued due to the other medication she is taking.   She has been a little calmer this week but last was giving her caregivers a fit.  She has start "riding a bike" with her legs all day now instead of just at night.  Also she has a round sore on top of her right foot from the excessive movement and hitting the bed rails.  Daughter states that her right ankle above this spot is now swollen and not sure if it is from this place or the CHF.  [name] states that her left leg isn't swollen at all.  She still has requip at home if she can get the ok from MD to start back.  She will also need a refill on her haldol soon since she isn't out but getting low.  States that patient has been spitting out her PRN medication when she is worked up and woke take it.  Daughter states that one of the caregivers has decreased his hours from 8 to 3 due to the patient's behavior.  Please advise and call daughter back if ok to start medication back.

## 2015-07-22 ENCOUNTER — Telehealth: Payer: Self-pay | Admitting: Family Medicine

## 2015-07-22 NOTE — Telephone Encounter (Signed)
Spoke with daughter. She states patient has been more aggressive lately, both verbally and physically. Daughter states she plans on eventually having her sent to a "dementia unit." She states that the hired caregivers are leaving because patient is becoming hard to manage (mainly difficult to transfer). Discussed home visit for tomorrow after 2:00pm. Daughter okay with home visit. Would like a call prior to our arrival.

## 2015-07-22 NOTE — Telephone Encounter (Signed)
Will forward to MD.  Patient will need a FL2.  Andrea [name],CMA

## 2015-07-22 NOTE — Telephone Encounter (Signed)
Daughter called back. She is considering moving mom to [facility] dementia unit. She will be needing CL 2 (?) sent to [facility] Please call her back

## 2015-07-23 ENCOUNTER — Encounter: Payer: Self-pay | Admitting: Family Medicine

## 2015-07-23 ENCOUNTER — Other Ambulatory Visit: Payer: Self-pay | Admitting: Family Medicine

## 2015-07-24 ENCOUNTER — Other Ambulatory Visit: Payer: Self-pay | Admitting: Family Medicine

## 2015-07-24 ENCOUNTER — Telehealth: Payer: Self-pay | Admitting: *Deleted

## 2015-07-24 NOTE — Telephone Encounter (Signed)
Pts dgt is calling to talk about the Haldol Rx.  States that she was told to give her haldol as needed, but could not find the bottle at this time.  She also wanted to let the MD know that the klonopin did not "react well with the patient".  The pts RN [facility]) wants to know if they can give the Haldol only at night and not during the day.  Daughter would like Dr. Gayla Doss to give her a call [place], Maryjo [place]

## 2015-07-24 NOTE — Telephone Encounter (Signed)
Will forward to MD.  Refill request already received from pharmacy and sent to provider also. Jazmin Hartsell,CMA

## 2015-07-28 ENCOUNTER — Telehealth: Payer: Self-pay | Admitting: Family Medicine

## 2015-07-28 DIAGNOSIS — F0391 Unspecified dementia with behavioral disturbance: Secondary | ICD-10-CM

## 2015-07-28 DIAGNOSIS — F03918 Unspecified dementia, unspecified severity, with other behavioral disturbance: Secondary | ICD-10-CM

## 2015-07-28 NOTE — Telephone Encounter (Signed)
CSW spoke with daughter who states she is waiting on a call regarding pts Haldol Rx. Daughter also states that at this time she is not ready to pursue ALF placement however will contact CSW or the clinic when she is ready and in need of an FL2.  Theresia Bough, MSW, LCSW [phone]

## 2015-07-28 NOTE — Telephone Encounter (Signed)
Called Daughter [name] to discuss recent home visit and see how her mother was doing now; however is was unable to come to the phone. Will try again later this afternoon if possible. I had previously refilled the prn Haldol, but will speak with her about transitioning to Risperidone and switching benzo to ativan.

## 2015-07-28 NOTE — Telephone Encounter (Signed)
Spoke with daughter and she states that any time at home number is fine.  She is home today. Milicent Acheampong,CMA

## 2015-07-29 ENCOUNTER — Telehealth: Payer: Self-pay | Admitting: Clinical

## 2015-07-29 NOTE — Telephone Encounter (Signed)
CSW received a call from pts daughter stating that she actually wants to pursue placement at [facility]- Dementia unit. Daughter states she does not feel like she or pts caregivers can manage her behavior. CSW has agreed to complete pts FL2. CSW will confirm that pts medications are up to date on the med list. CSW will place FL2 in PCP's box for review and fax it to [facility].  Theresia Bough, MSW, LCSW [phone]

## 2015-07-29 NOTE — Telephone Encounter (Signed)
Called daughter.  She is ready to move forward with placement in [facility].  Her mother continues to take Klonopin twice a day and Ativan (0.5mg ) at 3 PM, which has helped with her akathisia.  She continues Seroquel 3 times a day, has been using 0.5 mg Haldol 2-3 times daily as needed for agitation.  She continues to remain off Requip.  Discussed switching Klonopin to Ativan and Haldol to Risperdal; however the daughter wishes to continue with current medication regimen.   -Told daughter I would complete FL 2; She will call back if she has additional questions or needs

## 2015-07-31 MED ORDER — RISPERIDONE 0.25 MG PO TABS: 0.2500 mg | ORAL_TABLET | Freq: Two times a day (BID) | ORAL | Status: AC

## 2015-07-31 NOTE — Telephone Encounter (Signed)
CSW did a chart review and observed that daughter called and is no long interested in placement for pt. CSW will not send FL2. CSW will remain following if additional concerns arise.  Theresia Bough, MSW, LCSW [phone]

## 2015-07-31 NOTE — Telephone Encounter (Signed)
pts daughter called asking if Dr. Gayla Doss can go ahead and start the risperdal. Has also decided not to place patient at [facility] due to her dementia, she believes she would be kicked out out due to her behavior.

## 2015-07-31 NOTE — Telephone Encounter (Signed)
Called daughter [name], she wants to try to keep her mother at home with her aides for a little longer.  - Discontinued Haldol - Start Risperdal 0.25mg  BID; Can increase to 0.5 mg BID if agitation / hostility persist after 3-4 days on 0.25 dose - Continue Klonopin BID and Ativan 0.5 mg qd prn for akathisia - Daughter will call on Monday to report

## 2015-08-01 ENCOUNTER — Encounter (HOSPITAL_COMMUNITY): Payer: Self-pay | Admitting: *Deleted

## 2015-08-01 ENCOUNTER — Emergency Department (HOSPITAL_COMMUNITY)
Admission: EM | Admit: 2015-08-01 | Discharge: 2015-08-01 | Disposition: A | Discharge status: Home / Self Care | Payer: Medicare Other | Attending: Emergency Medicine | Admitting: Emergency Medicine

## 2015-08-01 DIAGNOSIS — Z8701 Personal history of pneumonia (recurrent): Secondary | ICD-10-CM | POA: Diagnosis not present

## 2015-08-01 DIAGNOSIS — N189 Chronic kidney disease, unspecified: Secondary | ICD-10-CM | POA: Insufficient documentation

## 2015-08-01 DIAGNOSIS — F329 Major depressive disorder, single episode, unspecified: Secondary | ICD-10-CM | POA: Diagnosis not present

## 2015-08-01 DIAGNOSIS — F0280 Dementia in other diseases classified elsewhere without behavioral disturbance: Secondary | ICD-10-CM | POA: Diagnosis not present

## 2015-08-01 DIAGNOSIS — Z79899 Other long term (current) drug therapy: Secondary | ICD-10-CM | POA: Diagnosis not present

## 2015-08-01 DIAGNOSIS — I129 Hypertensive chronic kidney disease with stage 1 through stage 4 chronic kidney disease, or unspecified chronic kidney disease: Secondary | ICD-10-CM | POA: Insufficient documentation

## 2015-08-01 DIAGNOSIS — J449 Chronic obstructive pulmonary disease, unspecified: Secondary | ICD-10-CM | POA: Insufficient documentation

## 2015-08-01 DIAGNOSIS — G309 Alzheimer's disease, unspecified: Secondary | ICD-10-CM | POA: Diagnosis not present

## 2015-08-01 DIAGNOSIS — F0390 Unspecified dementia without behavioral disturbance: Secondary | ICD-10-CM

## 2015-08-01 DIAGNOSIS — R4182 Altered mental status, unspecified: Secondary | ICD-10-CM | POA: Diagnosis present

## 2015-08-01 DIAGNOSIS — Z8781 Personal history of (healed) traumatic fracture: Secondary | ICD-10-CM | POA: Diagnosis not present

## 2015-08-01 NOTE — ED Notes (Signed)
Pt lives at home with in home care, had a new cna today who advised that pt was agitated with her today, then started to notice a decline in pt's mental status around 6pm, on arrival with ems pt would attempt to answer simple questions but would know some answers but be confused to other answers, , cna with pt did not know pt's baseline mental status, ems was not able to contact any family after several attempts, on arrival to er, pt able to answer her name, knows where she is at, is able to state her birthday,

## 2015-08-01 NOTE — Discharge Instructions (Signed)
Return for any new or worse symptoms. °

## 2015-08-01 NOTE — ED Notes (Signed)
Dr Zackoswki at bedside,  

## 2015-08-01 NOTE — ED Provider Notes (Signed)
CSN: [id number]     Arrival date & time 08/01/15  2106 History  This chart was scribed for Vanetta Mulders, MD by Doreatha Martin, ED Scribe. This patient was seen in room APA05/APA05 and the patient's care was started at 9:23 PM.     Chief Complaint  Patient presents with  . Altered Mental Status   LEVEL 5 CAVEAT: HPI and ROS limited due to AMS   Patient is a 81 y.o. female presenting with altered mental status. The history is provided by the patient and a caregiver. The history is limited by the condition of the patient. No language interpreter was used.  Altered Mental Status Presenting symptoms: combativeness and confusion   Context: dementia   Context: not a recent illness     HPI Comments: Andrea [name] is a 81 y.o. female BIBA with Hx of COPD, alzheimer's, CKD, HTN, dementia who presents to the Emergency Department due to AMS onset this evening with associated combativeness, according to a new caregiver. Pt came from home and has a new CNA who was concerned that she was more confused than usual. Per primary caregiver, the pt is at her baseline mental status and they are not requesting any medical treatment. Pt is on medication for alzheimer's and has a DNR. Pt denies any current pain, recent sickness.   Past Medical History  Diagnosis Date  . COPD (chronic obstructive pulmonary disease)   . Alzheimer's dementia   . Depression   . Chronic kidney disease   . Hypertension   . Dementia with behavioral disturbance 08/26/2014  . Acute respiratory failure 09/12/2014  . Pneumonia 09/12/2014  . L2 vertebral fracture 08/25/2014  . Hip fracture 03/03/2015  . Flash pulmonary edema 09/14/2014  . Orthostatic hypotension 08/26/2014  . Right hip pain 03/03/2015   Past Surgical History  Procedure Laterality Date  . Intramedullary (im) nail intertrochanteric Right 03/04/2015    Procedure: INTRAMEDULLARY (IM) NAIL INTERTROCHANTRIC;  Surgeon: Eldred Manges, MD;  Location: [facility] OR;  Service:  Orthopedics;  Laterality: Right;   Family History  Problem Relation Age of Onset  . Suicidality Other    Social History  Substance Use Topics  . Smoking status: Never Smoker   . Smokeless tobacco: None  . Alcohol Use: No   OB History    No data available     Review of Systems  Unable to perform ROS: Mental status change  Psychiatric/Behavioral: Positive for confusion.   Allergies  Aspirin  Home Medications   Prior to Admission medications   Medication Sig Start Date End Date Taking? Authorizing Provider  albuterol (PROVENTIL HFA;VENTOLIN HFA) 108 (90 BASE) MCG/ACT inhaler Inhale 1-2 puffs into the lungs every 6 (six) hours as needed for wheezing or shortness of breath. Patient not taking: Reported on 03/04/2015 10/01/14   Richardean Canal, MD  ANORO ELLIPTA 62.5-25 MCG/INH AEPB Inhale 1 puff into the lungs daily. 01/28/15   Historical Provider, MD  bisacodyl (DULCOLAX) 10 MG suppository Place 1 suppository (10 mg total) rectally daily as needed for moderate constipation or severe constipation. 03/06/15   Richarda Overlie, MD  buPROPion (WELLBUTRIN XL) 300 MG 24 hr tablet Take 300 mg by mouth every morning. 02/16/15   Historical Provider, MD  clonazePAM (KLONOPIN) 0.5 MG tablet Take 1 tablet (0.5 mg total) by mouth 3 (three) times daily. 07/23/15   Leighton Roach McDiarmid, MD  escitalopram (LEXAPRO) 20 MG tablet Take 1 tablet (20 mg total) by mouth daily. 08/28/14   Ripudeep Jenna Luo, MD  feeding supplement, ENSURE, (ENSURE) PUDG Take 1 Container by mouth as needed. 07/23/15   Leighton Roach McDiarmid, MD  food thickener (THICK IT) POWD 30 containers 03/06/15   Richarda Overlie, MD  haloperidol (HALDOL) 0.5 MG tablet Take 1 tablet (0.5 mg total) by mouth at bedtime. 07/23/15   Leighton Roach McDiarmid, MD  Melatonin 3 MG TABS Take 3 tablets (9 mg total) by mouth at bedtime. 07/13/15   Jamal Collin, MD  metoprolol tartrate (LOPRESSOR) 25 MG tablet Take 0.5 tablets (12.5 mg total) by mouth 2 (two) times daily. 03/06/15   Richarda Overlie, MD  QUEtiapine (SEROQUEL) 25 MG tablet Take 1 tablet (25 mg total) by mouth 3 (three) times daily. 50mg  qam, 25mg  qpm 07/23/15   Leighton Roach McDiarmid, MD  risperiDONE (RISPERDAL) 0.25 MG tablet Take 1 tablet (0.25 mg total) by mouth 2 (two) times daily. 07/31/15   Jamal Collin, MD  rOPINIRole (REQUIP) 1 MG tablet Take 1 mg by mouth at bedtime. 04/29/15   Historical Provider, MD  senna-docusate (SENOKOT-S) 8.6-50 MG per tablet Take 1 tablet by mouth at bedtime. 03/06/15   Richarda Overlie, MD   BP 189/80 mmHg  Pulse 62  Temp(Src) 98 F (36.7 C) (Rectal)  Resp 22  SpO2 90% Physical Exam  Constitutional: She is oriented to person, place, and time. She appears well-developed and well-nourished.  HENT:  Head: Normocephalic and atraumatic.  Mucous membranes slightly dry  Eyes: Conjunctivae and EOM are normal. Pupils are equal, round, and reactive to light. No scleral icterus.  Eyes track normally  Neck: Normal range of motion. Neck supple.  Cardiovascular: Normal rate, regular rhythm and normal heart sounds.   No murmur heard. Pulmonary/Chest: Effort normal and breath sounds normal. No respiratory distress.  Lungs CTA bilaterally.   Abdominal: Soft. Bowel sounds are normal. She exhibits no distension. There is no tenderness.  Musculoskeletal: Normal range of motion. She exhibits no edema.  No swelling in the ankles.   Neurological: She is alert and oriented to person, place, and time. No cranial nerve deficit. She exhibits normal muscle tone. Coordination normal.  Skin: Skin is warm and dry.  Psychiatric: She has a normal mood and affect. Her behavior is normal.  Nursing note and vitals reviewed.   ED Course  Procedures (including critical care time) DIAGNOSTIC STUDIES: Oxygen Saturation is 100% on RA, normal by my interpretation.    COORDINATION OF CARE: 9:27 PM Discussed treatment plan with pt at bedside and pt agreed to plan.   Labs Review Labs Reviewed - No data to  display  Imaging Review No results found. I have personally reviewed and evaluated these images and lab results as part of my medical decision-making.   EKG Interpretation   Date/Time:  Saturday August 01 2015 21:09:31 EDT Ventricular Rate:  61 PR Interval:  224 QRS Duration: 88 QT Interval:  460 QTC Calculation: 463 R Axis:   37 Text Interpretation:  Sinus rhythm Prolonged PR interval No significant  change since last tracing Confirmed by Tattiana Fakhouri  MD, Mizael Sagar [phone]) on  08/01/2015 9:22:35 PM      MDM   Final diagnoses:  Dementia, without behavioral disturbance    Patient's family have arrived. Patient with known history of dementia. Patient family states patient is baseline. Patient is a DO NOT RESUSCITATE. Patient appears in no acute distress here. Vital signs without significant abnormalities. Patient will be discharged back home with family.  Most likely what occurred this evening was that a new  caregiver showed up that the patient was not familiar with and she became agitated. Family was not there to assess the situation the EMS was called and patient was brought in for evaluation of altered mental status. It appears that patient is at baseline.    I personally performed the services described in this documentation, which was scribed in my presence. The recorded information has been reviewed and is accurate.    Vanetta Mulders, MD 08/01/15 [phone]

## 2015-08-01 NOTE — ED Notes (Signed)
cbg 110 with ems,

## 2015-08-07 ENCOUNTER — Emergency Department (HOSPITAL_COMMUNITY): Payer: Medicare Other

## 2015-08-07 ENCOUNTER — Inpatient Hospital Stay (HOSPITAL_COMMUNITY)
Admission: EM | Admit: 2015-08-07 | Discharge: 2015-08-11 | DRG: 689 | Disposition: A | Discharge status: Hospice | Payer: Medicare Other | Attending: Internal Medicine | Admitting: Internal Medicine

## 2015-08-07 ENCOUNTER — Encounter (HOSPITAL_COMMUNITY): Payer: Self-pay

## 2015-08-07 DIAGNOSIS — F0281 Dementia in other diseases classified elsewhere with behavioral disturbance: Secondary | ICD-10-CM | POA: Diagnosis present

## 2015-08-07 DIAGNOSIS — J449 Chronic obstructive pulmonary disease, unspecified: Secondary | ICD-10-CM | POA: Diagnosis present

## 2015-08-07 DIAGNOSIS — R296 Repeated falls: Secondary | ICD-10-CM | POA: Diagnosis present

## 2015-08-07 DIAGNOSIS — N183 Chronic kidney disease, stage 3 unspecified: Secondary | ICD-10-CM | POA: Diagnosis present

## 2015-08-07 DIAGNOSIS — N3000 Acute cystitis without hematuria: Secondary | ICD-10-CM | POA: Diagnosis not present

## 2015-08-07 DIAGNOSIS — F0391 Unspecified dementia with behavioral disturbance: Secondary | ICD-10-CM | POA: Diagnosis not present

## 2015-08-07 DIAGNOSIS — Z7189 Other specified counseling: Secondary | ICD-10-CM | POA: Insufficient documentation

## 2015-08-07 DIAGNOSIS — E46 Unspecified protein-calorie malnutrition: Secondary | ICD-10-CM

## 2015-08-07 DIAGNOSIS — J9601 Acute respiratory failure with hypoxia: Secondary | ICD-10-CM | POA: Diagnosis present

## 2015-08-07 DIAGNOSIS — W010XXA Fall on same level from slipping, tripping and stumbling without subsequent striking against object, initial encounter: Secondary | ICD-10-CM

## 2015-08-07 DIAGNOSIS — L8990 Pressure ulcer of unspecified site, unspecified stage: Secondary | ICD-10-CM | POA: Diagnosis present

## 2015-08-07 DIAGNOSIS — E43 Unspecified severe protein-calorie malnutrition: Secondary | ICD-10-CM | POA: Diagnosis present

## 2015-08-07 DIAGNOSIS — N390 Urinary tract infection, site not specified: Secondary | ICD-10-CM | POA: Diagnosis not present

## 2015-08-07 DIAGNOSIS — R059 Cough, unspecified: Secondary | ICD-10-CM

## 2015-08-07 DIAGNOSIS — G308 Other Alzheimer's disease: Secondary | ICD-10-CM | POA: Diagnosis not present

## 2015-08-07 DIAGNOSIS — L89151 Pressure ulcer of sacral region, stage 1: Secondary | ICD-10-CM | POA: Diagnosis present

## 2015-08-07 DIAGNOSIS — R627 Adult failure to thrive: Secondary | ICD-10-CM | POA: Diagnosis present

## 2015-08-07 DIAGNOSIS — Z66 Do not resuscitate: Secondary | ICD-10-CM | POA: Diagnosis present

## 2015-08-07 DIAGNOSIS — S12000A Unspecified displaced fracture of first cervical vertebra, initial encounter for closed fracture: Secondary | ICD-10-CM | POA: Diagnosis not present

## 2015-08-07 DIAGNOSIS — Z7401 Bed confinement status: Secondary | ICD-10-CM | POA: Diagnosis not present

## 2015-08-07 DIAGNOSIS — G309 Alzheimer's disease, unspecified: Secondary | ICD-10-CM | POA: Diagnosis present

## 2015-08-07 DIAGNOSIS — I5022 Chronic systolic (congestive) heart failure: Secondary | ICD-10-CM | POA: Diagnosis present

## 2015-08-07 DIAGNOSIS — Z681 Body mass index (BMI) 19 or less, adult: Secondary | ICD-10-CM | POA: Diagnosis not present

## 2015-08-07 DIAGNOSIS — I129 Hypertensive chronic kidney disease with stage 1 through stage 4 chronic kidney disease, or unspecified chronic kidney disease: Secondary | ICD-10-CM | POA: Diagnosis present

## 2015-08-07 DIAGNOSIS — R32 Unspecified urinary incontinence: Secondary | ICD-10-CM | POA: Diagnosis present

## 2015-08-07 DIAGNOSIS — S12000D Unspecified displaced fracture of first cervical vertebra, subsequent encounter for fracture with routine healing: Secondary | ICD-10-CM | POA: Diagnosis not present

## 2015-08-07 DIAGNOSIS — F0390 Unspecified dementia without behavioral disturbance: Secondary | ICD-10-CM

## 2015-08-07 DIAGNOSIS — Z515 Encounter for palliative care: Secondary | ICD-10-CM | POA: Diagnosis not present

## 2015-08-07 DIAGNOSIS — R51 Headache: Secondary | ICD-10-CM | POA: Diagnosis present

## 2015-08-07 DIAGNOSIS — R05 Cough: Secondary | ICD-10-CM

## 2015-08-07 DIAGNOSIS — R1310 Dysphagia, unspecified: Secondary | ICD-10-CM

## 2015-08-07 DIAGNOSIS — I10 Essential (primary) hypertension: Secondary | ICD-10-CM | POA: Diagnosis present

## 2015-08-07 DIAGNOSIS — E876 Hypokalemia: Secondary | ICD-10-CM | POA: Diagnosis present

## 2015-08-07 DIAGNOSIS — S0083XA Contusion of other part of head, initial encounter: Secondary | ICD-10-CM

## 2015-08-07 DIAGNOSIS — N179 Acute kidney failure, unspecified: Secondary | ICD-10-CM | POA: Diagnosis present

## 2015-08-07 DIAGNOSIS — R400 Somnolence: Secondary | ICD-10-CM | POA: Diagnosis not present

## 2015-08-07 DIAGNOSIS — F418 Other specified anxiety disorders: Secondary | ICD-10-CM | POA: Diagnosis present

## 2015-08-07 LAB — URINALYSIS, ROUTINE W REFLEX MICROSCOPIC
Glucose, UA: NEGATIVE mg/dL
KETONES UR: NEGATIVE mg/dL
NITRITE: POSITIVE — AB
PH: 7 (ref 5.0–8.0)
PROTEIN: 100 mg/dL — AB
Specific Gravity, Urine: 1.025 (ref 1.005–1.030)
UROBILINOGEN UA: 0.2 mg/dL (ref 0.0–1.0)

## 2015-08-07 LAB — COMPREHENSIVE METABOLIC PANEL
ALBUMIN: 3.4 g/dL — AB (ref 3.5–5.0)
ALT: 21 U/L (ref 14–54)
ANION GAP: 7 (ref 5–15)
AST: 19 U/L (ref 15–41)
Alkaline Phosphatase: 71 U/L (ref 38–126)
BILIRUBIN TOTAL: 0.5 mg/dL (ref 0.3–1.2)
BUN: 47 mg/dL — ABNORMAL HIGH (ref 6–20)
CALCIUM: 9.3 mg/dL (ref 8.9–10.3)
CO2: 31 mmol/L (ref 22–32)
Chloride: 106 mmol/L (ref 101–111)
Creatinine, Ser: 1.36 mg/dL — ABNORMAL HIGH (ref 0.44–1.00)
GFR calc non Af Amer: 32 mL/min — ABNORMAL LOW (ref >60)
GFR, EST AFRICAN AMERICAN: 38 mL/min — AB (ref >60)
GLUCOSE: 104 mg/dL — AB (ref 65–99)
POTASSIUM: 4.3 mmol/L (ref 3.5–5.1)
SODIUM: 144 mmol/L (ref 135–145)
TOTAL PROTEIN: 5.8 g/dL — AB (ref 6.5–8.1)

## 2015-08-07 LAB — CBC
HCT: 38.1 % (ref 36.0–46.0)
Hemoglobin: 12.1 g/dL (ref 12.0–15.0)
MCH: 31 pg (ref 26.0–34.0)
MCHC: 31.8 g/dL (ref 30.0–36.0)
MCV: 97.7 fL (ref 78.0–100.0)
Platelets: 248 10*3/uL (ref 150–400)
RBC: 3.9 MIL/uL (ref 3.87–5.11)
RDW: 13.5 % (ref 11.5–15.5)
WBC: 12.4 10*3/uL — ABNORMAL HIGH (ref 4.0–10.5)

## 2015-08-07 LAB — URINE MICROSCOPIC-ADD ON

## 2015-08-07 MED ORDER — RISPERIDONE 0.5 MG PO TABS
0.2500 mg | ORAL_TABLET | Freq: Two times a day (BID) | ORAL | Status: DC
Start: 2015-08-07 — End: 2015-08-08
  Administered 2015-08-07: 0.25 mg via ORAL
  Administered 2015-08-08: 11:00:00 via ORAL
  Filled 2015-08-07 (×2): qty 1

## 2015-08-07 MED ORDER — IBUPROFEN 100 MG/5ML PO SUSP
200.0000 mg | Freq: Every day | ORAL | Status: DC | PRN
Start: 2015-08-07 — End: 2015-08-08

## 2015-08-07 MED ORDER — SODIUM CHLORIDE 0.9 % IV BOLUS (SEPSIS)
1000.0000 mL | Freq: Once | INTRAVENOUS | Status: AC
  Administered 2015-08-07: 1000 mL via INTRAVENOUS

## 2015-08-07 MED ORDER — SODIUM CHLORIDE 0.9 % IV SOLN: INTRAVENOUS | Status: AC

## 2015-08-07 MED ORDER — BUPROPION HCL ER (XL) 300 MG PO TB24
300.0000 mg | ORAL_TABLET | Freq: Every day | ORAL | Status: DC
  Administered 2015-08-08: 300 mg via ORAL
  Filled 2015-08-07 (×3): qty 2

## 2015-08-07 MED ORDER — ENOXAPARIN SODIUM 30 MG/0.3ML ~~LOC~~ SOLN
30.0000 mg | SUBCUTANEOUS | Status: DC
  Administered 2015-08-07 – 2015-08-10 (×4): 30 mg via SUBCUTANEOUS
  Filled 2015-08-07 (×4): qty 0.3

## 2015-08-07 MED ORDER — ONDANSETRON HCL 4 MG/2ML IJ SOLN: 4.0000 mg | Freq: Four times a day (QID) | INTRAMUSCULAR | Status: DC | PRN

## 2015-08-07 MED ORDER — QUETIAPINE FUMARATE 25 MG PO TABS
25.0000 mg | ORAL_TABLET | Freq: Three times a day (TID) | ORAL | Status: DC
  Administered 2015-08-07 – 2015-08-08 (×2): 25 mg via ORAL
  Filled 2015-08-07 (×2): qty 1

## 2015-08-07 MED ORDER — SODIUM CHLORIDE 0.9 % IV SOLN
INTRAVENOUS | Status: DC
  Administered 2015-08-07: 22:00:00 via INTRAVENOUS

## 2015-08-07 MED ORDER — CLONAZEPAM 0.5 MG PO TABS
0.2500 mg | ORAL_TABLET | Freq: Every day | ORAL | Status: DC | PRN
  Filled 2015-08-07: qty 1

## 2015-08-07 MED ORDER — ONDANSETRON HCL 4 MG PO TABS: 4.0000 mg | ORAL_TABLET | Freq: Four times a day (QID) | ORAL | Status: DC | PRN

## 2015-08-07 MED ORDER — DEXTROSE 5 % IV SOLN
1.0000 g | INTRAVENOUS | Status: DC
  Administered 2015-08-08 – 2015-08-10 (×3): 1 g via INTRAVENOUS
  Filled 2015-08-07 (×4): qty 10

## 2015-08-07 MED ORDER — UMECLIDINIUM-VILANTEROL 62.5-25 MCG/INH IN AEPB
1.0000 | INHALATION_SPRAY | RESPIRATORY_TRACT | Status: DC
  Filled 2015-08-07 (×2): qty 60

## 2015-08-07 MED ORDER — ALUM & MAG HYDROXIDE-SIMETH 200-200-20 MG/5ML PO SUSP: 30.0000 mL | Freq: Four times a day (QID) | ORAL | Status: DC | PRN

## 2015-08-07 MED ORDER — ACETAMINOPHEN 650 MG RE SUPP: 650.0000 mg | Freq: Four times a day (QID) | RECTAL | Status: DC | PRN

## 2015-08-07 MED ORDER — DEXTROSE 5 % IV SOLN
1.0000 g | Freq: Once | INTRAVENOUS | Status: AC
  Administered 2015-08-07: 1 g via INTRAVENOUS
  Filled 2015-08-07: qty 10

## 2015-08-07 MED ORDER — METOPROLOL TARTRATE 25 MG PO TABS
12.5000 mg | ORAL_TABLET | Freq: Two times a day (BID) | ORAL | Status: DC
  Administered 2015-08-07: 12.5 mg via ORAL
  Administered 2015-08-08: 11:00:00 via ORAL
  Filled 2015-08-07 (×2): qty 1

## 2015-08-07 MED ORDER — SODIUM CHLORIDE 0.9 % IV BOLUS (SEPSIS): 500.0000 mL | Freq: Once | INTRAVENOUS | Status: DC

## 2015-08-07 MED ORDER — CLONAZEPAM 0.5 MG PO TABS
0.5000 mg | ORAL_TABLET | Freq: Three times a day (TID) | ORAL | Status: DC
  Administered 2015-08-07 – 2015-08-08 (×2): 0.5 mg via ORAL
  Filled 2015-08-07 (×2): qty 1

## 2015-08-07 MED ORDER — LIDOCAINE HCL (PF) 1 % IJ SOLN
INTRAMUSCULAR | Status: AC
  Filled 2015-08-07: qty 5

## 2015-08-07 MED ORDER — POLYETHYLENE GLYCOL 3350 17 G PO PACK: 17.0000 g | PACK | Freq: Every day | ORAL | Status: DC | PRN

## 2015-08-07 MED ORDER — ACETAMINOPHEN 325 MG PO TABS
650.0000 mg | ORAL_TABLET | Freq: Four times a day (QID) | ORAL | Status: DC | PRN
  Administered 2015-08-09 – 2015-08-11 (×4): 650 mg via ORAL
  Filled 2015-08-07 (×4): qty 2

## 2015-08-07 MED ORDER — ESCITALOPRAM OXALATE 10 MG PO TABS
20.0000 mg | ORAL_TABLET | Freq: Every day | ORAL | Status: DC
  Administered 2015-08-07 – 2015-08-08 (×2): 20 mg via ORAL
  Filled 2015-08-07 (×2): qty 2

## 2015-08-07 MED ORDER — MELATONIN 10 MG PO CAPS: 1.0000 | ORAL_CAPSULE | Freq: Every day | ORAL | Status: DC

## 2015-08-07 MED ORDER — ENSURE PUDDING PO PUDG
1.0000 | Freq: Three times a day (TID) | ORAL | Status: DC
  Filled 2015-08-07 (×9): qty 1

## 2015-08-07 NOTE — H&P (Signed)
History and Physical  Andrea [name] WUJ:[id number] DOB: 1919-05-25 DOA: 08/07/2015  Referring physician: Dr Denton Lank, ED physician PCP: Wenda Low, MD   Chief Complaint: Fatigue  HPI: Andrea [name] is a 81 y.o. female  With Alzheimer's disease with behavioral disturbance, COPD, hypertension, CHF, recent hip fracture.  Due to the degree of dementia, the patient is unreliable and the history is given by her caregiver. Patient is bed bound with urinary incontinence.  She reportedly had a fall 6 days ago, although this was not communicated with the ED physician when she was brought here.  She has had increasing neck pain over the past 5 days, worse with movement.  The caregiver notes that she has had increasing somnolence during the day over the past several days which is worsened.   Review of Systems:  Unobtainable due to dementia  Past Medical History  Diagnosis Date  . COPD (chronic obstructive pulmonary disease)   . Alzheimer's dementia   . Depression   . Chronic kidney disease   . Hypertension   . Dementia with behavioral disturbance 08/26/2014  . Acute respiratory failure 09/12/2014  . Pneumonia 09/12/2014  . L2 vertebral fracture 08/25/2014  . Hip fracture 03/03/2015  . Flash pulmonary edema 09/14/2014  . Orthostatic hypotension 08/26/2014  . Right hip pain 03/03/2015   Past Surgical History  Procedure Laterality Date  . Intramedullary (im) nail intertrochanteric Right 03/04/2015    Procedure: INTRAMEDULLARY (IM) NAIL INTERTROCHANTRIC;  Surgeon: Eldred Manges, MD;  Location: [facility] OR;  Service: Orthopedics;  Laterality: Right;   Social History:  reports that she has never smoked. She does not have any smokeless tobacco history on file. She reports that she does not drink alcohol or use illicit drugs. Patient lives at home & is not able to participate in activities of daily living   Allergies  Allergen Reactions  . Aspirin Other (See Comments)    Stomach upset    Family  History  Problem Relation Age of Onset  . Suicidality Other       Prior to Admission medications   Medication Sig Start Date End Date Taking? Authorizing Terryn Redner  ANORO ELLIPTA 62.5-25 MCG/INH AEPB Inhale 1 puff into the lungs 4 (four) times a week. Take on Monday, Wednesday, Friday, and Sunday. 01/28/15  Yes Historical Paislei Dorval, MD  buPROPion (WELLBUTRIN XL) 300 MG 24 hr tablet Take 300 mg by mouth every morning. 02/16/15  Yes Historical Cindee Mclester, MD  Cholecalciferol (VITAMIN D PO) Take 1 tablet by mouth daily.   Yes Historical Greyson Peavy, MD  clonazePAM (KLONOPIN) 0.25 MG disintegrating tablet Take 0.25 mg by mouth daily as needed (breakthrough anxiety).   Yes Historical Naylee Frankowski, MD  clonazePAM (KLONOPIN) 0.5 MG tablet Take 1 tablet (0.5 mg total) by mouth 3 (three) times daily. 07/23/15  Yes Leighton Roach McDiarmid, MD  escitalopram (LEXAPRO) 20 MG tablet Take 1 tablet (20 mg total) by mouth daily. 08/28/14  Yes Ripudeep Jenna Luo, MD  feeding supplement, ENSURE, (ENSURE) PUDG Take 1 Container by mouth as needed. 07/23/15  Yes Leighton Roach McDiarmid, MD  ibuprofen (IBUPROFEN) 100 MG/5ML suspension Take 200 mg by mouth daily as needed for moderate pain.   Yes Historical Amila Callies, MD  IRON PO Take 1 tablet by mouth 3 (three) times a week. Take on Monday, Wednesday, and Friday.   Yes Historical Baylie Drakes, MD  Melatonin 10 MG CAPS Take 1 tablet by mouth at bedtime.   Yes Historical Hadasah Brugger, MD  metoprolol tartrate (LOPRESSOR) 25 MG tablet Take 0.5  tablets (12.5 mg total) by mouth 2 (two) times daily. 03/06/15  Yes Richarda Overlie, MD  polyethylene glycol (MIRALAX / GLYCOLAX) packet Take 17 g by mouth daily as needed for mild constipation.   Yes Historical Corry Ihnen, MD  QUEtiapine (SEROQUEL) 25 MG tablet Take 25 mg by mouth 3 (three) times daily.  07/23/15  Yes Leighton Roach McDiarmid, MD  risperiDONE (RISPERDAL) 0.25 MG tablet Take 1 tablet (0.25 mg total) by mouth 2 (two) times daily. 07/31/15  Yes Jamal Collin, MD    Physical  Exam: BP 171/81 mmHg  Pulse 25  Resp 29  SpO2 92%  General: Frail and elderly-appearing Caucasian female. Awake and alert and oriented x3. No acute cardiopulmonary distress.  Eyes: Pupils equal, round, reactive to light. Extraocular muscles are intact. Sclerae anicteric and noninjected.  ENT: Dry mucosal membranes. No mucosal lesions. Neck: Neck supple without lymphadenopathy. No carotid bruits. No masses palpated.  Cardiovascular: Regular rate with normal S1-S2 sounds. No murmurs, rubs, gallops auscultated. No JVD.  Respiratory: Good respiratory effort with no wheezes, rales, rhonchi. Lungs clear to auscultation bilaterally.  Abdomen: Soft, nontender, nondistended. Active bowel sounds. No masses or hepatosplenomegaly  Skin: Dry, warm to touch. 2+ dorsalis pedis and radial pulses. Musculoskeletal: No calf or leg pain. All major joints not erythematous nontender.  Psychiatric: Intact judgment and insight.  Neurologic: No focal neurological deficits. Cranial nerves II through XII are grossly intact.           Labs on Admission:  Basic Metabolic Panel:  Recent Labs Lab 08/07/15 1248  NA 144  K 4.3  CL 106  CO2 31  GLUCOSE 104*  BUN 47*  CREATININE 1.36*  CALCIUM 9.3   Liver Function Tests:  Recent Labs Lab 08/07/15 1248  AST 19  ALT 21  ALKPHOS 71  BILITOT 0.5  PROT 5.8*  ALBUMIN 3.4*   No results for input(s): LIPASE, AMYLASE in the last 168 hours. No results for input(s): AMMONIA in the last 168 hours. CBC:  Recent Labs Lab 08/07/15 1248  WBC 12.4*  HGB 12.1  HCT 38.1  MCV 97.7  PLT 248   Cardiac Enzymes: No results for input(s): CKTOTAL, CKMB, CKMBINDEX, TROPONINI in the last 168 hours.  BNP (last 3 results) No results for input(s): BNP in the last 8760 hours.  ProBNP (last 3 results)  Recent Labs  09/12/14 0102 10/01/14 0938  PROBNP 9765.0* 1346.0*    CBG: No results for input(s): GLUCAP in the last 168 hours.  Radiological Exams on  Admission: Ct Head Wo Contrast  08/07/2015   CLINICAL DATA:  Old large bruise on the right forehead. Multiple bruises on lower extremities. Altered mental status. Not responsive.  EXAM: CT HEAD WITHOUT CONTRAST  CT CERVICAL SPINE WITHOUT CONTRAST  TECHNIQUE: Multidetector CT imaging of the head and cervical spine was performed following the standard protocol without intravenous contrast. Multiplanar CT image reconstructions of the cervical spine were also generated.  COMPARISON:  03/03/2015  FINDINGS: CT HEAD FINDINGS  There is significant central and cortical atrophy. Periventricular white matter changes are consistent with small vessel disease. There is no intra or extra-axial fluid collection or mass lesion. The basilar cisterns and ventricles have a normal appearance. There is no CT evidence for acute infarction or hemorrhage. Bone windows show right frontal scalp edema without underlying calvarial fracture. Small amount of fluid is identified within the sphenoid air cells. No acute calvarial fracture identified. There is dense atherosclerotic calcification of the internal carotid arteries appear  CT CERVICAL SPINE FINDINGS  There is an acute fracture involving the left anterior and left posterior arch of the C1 ring.  The left lateral mass is laterally subluxed as a result. There is a fracture fragment in the C1-2 foramina. There is no significant anterior-posterior subluxation. No other acute fractures are identified. There is significant disc height loss at numerous levels, including C4-5, C5-6, C6-7. There is anterolisthesis of C5 on C6 by 2 mm which is degenerative. Lung apices are unremarkable. The visualized portion of the thyroid gland has a normal appearance.  IMPRESSION: 1. Atrophy and small vessel disease. 2.  No evidence for acute intracranial abnormality. 3. Right frontal scalp edema without acute calvarial fracture. 4. Acute fracture left anterior and left posterior arch of the C1 ring associated  with lateral subluxation of the left lateral mass. 5. Degenerative changes in the cervical spine.  Critical Value/emergent results were called by telephone at the time of interpretation on 08/07/2015 at 3:51 pm to Dr. Cathren Laine , who verbally acknowledged these results.   Electronically Signed   By: Norva Pavlov M.D.   On: 08/07/2015 15:51   Ct Cervical Spine Wo Contrast  08/07/2015   CLINICAL DATA:  Old large bruise on the right forehead. Multiple bruises on lower extremities. Altered mental status. Not responsive.  EXAM: CT HEAD WITHOUT CONTRAST  CT CERVICAL SPINE WITHOUT CONTRAST  TECHNIQUE: Multidetector CT imaging of the head and cervical spine was performed following the standard protocol without intravenous contrast. Multiplanar CT image reconstructions of the cervical spine were also generated.  COMPARISON:  03/03/2015  FINDINGS: CT HEAD FINDINGS  There is significant central and cortical atrophy. Periventricular white matter changes are consistent with small vessel disease. There is no intra or extra-axial fluid collection or mass lesion. The basilar cisterns and ventricles have a normal appearance. There is no CT evidence for acute infarction or hemorrhage. Bone windows show right frontal scalp edema without underlying calvarial fracture. Small amount of fluid is identified within the sphenoid air cells. No acute calvarial fracture identified. There is dense atherosclerotic calcification of the internal carotid arteries appear  CT CERVICAL SPINE FINDINGS  There is an acute fracture involving the left anterior and left posterior arch of the C1 ring.  The left lateral mass is laterally subluxed as a result. There is a fracture fragment in the C1-2 foramina. There is no significant anterior-posterior subluxation. No other acute fractures are identified. There is significant disc height loss at numerous levels, including C4-5, C5-6, C6-7. There is anterolisthesis of C5 on C6 by 2 mm which is  degenerative. Lung apices are unremarkable. The visualized portion of the thyroid gland has a normal appearance.  IMPRESSION: 1. Atrophy and small vessel disease. 2.  No evidence for acute intracranial abnormality. 3. Right frontal scalp edema without acute calvarial fracture. 4. Acute fracture left anterior and left posterior arch of the C1 ring associated with lateral subluxation of the left lateral mass. 5. Degenerative changes in the cervical spine.  Critical Value/emergent results were called by telephone at the time of interpretation on 08/07/2015 at 3:51 pm to Dr. Cathren Laine , who verbally acknowledged these results.   Electronically Signed   By: Norva Pavlov M.D.   On: 08/07/2015 15:51   Dg Chest Port 1 View  08/07/2015   CLINICAL DATA:  Cough.  EXAM: PORTABLE CHEST - 1 VIEW  COMPARISON:  March 06, 2015.  FINDINGS: Stable cardiomegaly. No pneumothorax or pleural effusion is noted. Old left rib  fractures are noted. Mild degenerative change of right glenohumeral joint is noted. No acute pulmonary disease is noted.  IMPRESSION: No acute cardiopulmonary abnormality seen.   Electronically Signed   By: Lupita Raider, M.D.   On: 08/07/2015 13:12    EKG: Independently reviewed. Sinus rhythm. Normal intervals. No ST elevation or depression.  Assessment/Plan Present on Admission:  . Dementia . Pressure ulcer . Urinary tract infection . Fracture of C1 vertebra, closed  This patient was discussed with the ED physician, including pertinent vitals, physical exam findings, labs, and imaging.  We also discussed care given by the ED Jozef Eisenbeis.  #1 urinary tract infection  Admit  Urine culture pending  Ceftriaxone every 24 hours  Check CBC in the morning  Check metabolic panel in the morning #2 fracture C1 vertebra  ED physician discussed patient with neurosurgery  Continue collar #3 dementia  Worsened  I discussed the patient with the patient's daughter, Andrea [name], who is the power of  attorney. Andrea [name] indicated that she would like hospice consult.  We'll consult case management and social work for placement of hospice  Speech therapy consult as patient is aspiration risk #4 pressure ulcer  Wound management, frequent rolling #5 hypertension  DVT prophylaxis: Lovenox  Consultants: As above  Code Status: DO NOT RESUSCITATE  Family CommunicationRaymon Mutton - [phone]  Disposition Plan: Admit   Levie Heritage, DO [facility] Pager [phone]

## 2015-08-07 NOTE — ED Notes (Signed)
Please disregard low SPO2 readings, patient is cold to touch, a Neilcor was placed on her ear which slips off. When reapplied sats are 98% at this time. Report called to floor, Tameka, accepting nurse, all questions answered.

## 2015-08-07 NOTE — ED Notes (Signed)
Patient presented via EMS with NRB. Stats 100% on NRB, pt is known COPD NRB removed and sats stayed at 100%. Aid in room with patient, states head bruise has been there since Saturday, and she was seen here in ED that same day for dementia. Aid also commented that patient had had mutiple medication changes recently regarding Klonopin, Vistaril, Ativan etc

## 2015-08-07 NOTE — ED Notes (Signed)
Pt in via EMS. Per them home aid called stating patient was non responsive, aid did not know her baseline. Patients daughter did not come with patient because daughter has "nerve problems" and "dont want to see mom die". Patient is DNR with paperwork in room. On arrival patient moans, and will squeeze hands equally bilaterally and move legs bilaterally. Patient has what appears to be an old large bruise on right of forehead, and multiple bruises on lower exts.

## 2015-08-07 NOTE — Progress Notes (Signed)

## 2015-08-07 NOTE — ED Provider Notes (Addendum)
CSN: [id number]     Arrival date & time 08/07/15  1153 History  This chart was scribed for Cathren Laine, MD by Elon Spanner, ED Scribe. This patient was seen in room APA01/APA01 and the patient's care was started at 12:15 PM.   Chief Complaint  Patient presents with  . Altered Mental Status   The history is provided by the patient and a caregiver. The history is limited by the condition of the patient. No language interpreter was used.   HPI Comments: Andrea [name] is a 81 y.o. female with hx of dementia, COPD who presents to the Emergency Department complaining of altered mental status onset today.  - level 5 caveat - pt not verbally responsive.   The caretaker reports the patient typically is mobile with assistance, but did not want to get of bed today and has been generally lethargic.  Additionally, the patient has been talking to non-living family members including the mother and aunt.  The caregiver reports the patient was here 6 days ago for AMS, and at that time did have large bruise to right forehead from presumed recent fall - pt indicates mental state at that time was attributed to a new caregiver's lack of awareness over the patient's baseline state.  However, 5 days ago the patient began to complain of a neck pain.   Last night the patient began to have a mild cough, improved with side positioning. The patient has also had a recent medication change and caregiver notes some minimal, but increased daytime somnolence.  The patient has been eating/drinking normally with the exception of last night's dinner, which she did not eat(last meal: yesterday afternoon).  Overally, the caregiver reports the patient has been slowly declining since a hip fx 12/2014.  Patient has a DNR.  Caregiver denies fever.     Past Medical History  Diagnosis Date  . COPD (chronic obstructive pulmonary disease)   . Alzheimer's dementia   . Depression   . Chronic kidney disease   . Hypertension   . Dementia  with behavioral disturbance 08/26/2014  . Acute respiratory failure 09/12/2014  . Pneumonia 09/12/2014  . L2 vertebral fracture 08/25/2014  . Hip fracture 03/03/2015  . Flash pulmonary edema 09/14/2014  . Orthostatic hypotension 08/26/2014  . Right hip pain 03/03/2015   Past Surgical History  Procedure Laterality Date  . Intramedullary (im) nail intertrochanteric Right 03/04/2015    Procedure: INTRAMEDULLARY (IM) NAIL INTERTROCHANTRIC;  Surgeon: Eldred Manges, MD;  Location: [facility] OR;  Service: Orthopedics;  Laterality: Right;   Family History  Problem Relation Age of Onset  . Suicidality Other    Social History  Substance Use Topics  . Smoking status: Never Smoker   . Smokeless tobacco: None  . Alcohol Use: No   OB History    No data available       Review of Systems  Unable to perform ROS: Dementia  Constitutional: Negative for fever.  Gastrointestinal: Negative for vomiting and diarrhea.  level 5 caveat - dementia, pt not verbally responsive.    Allergies  Aspirin  Home Medications   Prior to Admission medications   Medication Sig Start Date End Date Taking? Authorizing Tarrance Januszewski  albuterol (PROVENTIL HFA;VENTOLIN HFA) 108 (90 BASE) MCG/ACT inhaler Inhale 1-2 puffs into the lungs every 6 (six) hours as needed for wheezing or shortness of breath. Patient not taking: Reported on 03/04/2015 10/01/14   Richardean Canal, MD  ANORO ELLIPTA 62.5-25 MCG/INH AEPB Inhale 1 puff into the lungs  daily. 01/28/15   Historical Yailine Ballard, MD  bisacodyl (DULCOLAX) 10 MG suppository Place 1 suppository (10 mg total) rectally daily as needed for moderate constipation or severe constipation. 03/06/15   Richarda Overlie, MD  buPROPion (WELLBUTRIN XL) 300 MG 24 hr tablet Take 300 mg by mouth every morning. 02/16/15   Historical Selena Swaminathan, MD  clonazePAM (KLONOPIN) 0.5 MG tablet Take 1 tablet (0.5 mg total) by mouth 3 (three) times daily. 07/23/15   Leighton Roach McDiarmid, MD  escitalopram (LEXAPRO) 20 MG tablet Take 1  tablet (20 mg total) by mouth daily. 08/28/14   Ripudeep Jenna Luo, MD  feeding supplement, ENSURE, (ENSURE) PUDG Take 1 Container by mouth as needed. 07/23/15   Leighton Roach McDiarmid, MD  food thickener (THICK IT) POWD 30 containers 03/06/15   Richarda Overlie, MD  haloperidol (HALDOL) 0.5 MG tablet Take 1 tablet (0.5 mg total) by mouth at bedtime. 07/23/15   Leighton Roach McDiarmid, MD  Melatonin 3 MG TABS Take 3 tablets (9 mg total) by mouth at bedtime. 07/13/15   Jamal Collin, MD  metoprolol tartrate (LOPRESSOR) 25 MG tablet Take 0.5 tablets (12.5 mg total) by mouth 2 (two) times daily. 03/06/15   Richarda Overlie, MD  QUEtiapine (SEROQUEL) 25 MG tablet Take 1 tablet (25 mg total) by mouth 3 (three) times daily. 50mg  qam, 25mg  qpm 07/23/15   Leighton Roach McDiarmid, MD  risperiDONE (RISPERDAL) 0.25 MG tablet Take 1 tablet (0.25 mg total) by mouth 2 (two) times daily. 07/31/15   Jamal Collin, MD  rOPINIRole (REQUIP) 1 MG tablet Take 1 mg by mouth at bedtime. 04/29/15   Historical Vonne Mcdanel, MD  senna-docusate (SENOKOT-S) 8.6-50 MG per tablet Take 1 tablet by mouth at bedtime. 03/06/15   Richarda Overlie, MD   There were no vitals taken for this visit. Physical Exam  Constitutional: She appears well-developed and well-nourished. No distress.  Very elderly, thin appearing.  HENT:  Head: Normocephalic and atraumatic.  Large bruise right forehead - yellowish/purple.  Dry mucous membranes.   Eyes: Conjunctivae are normal. Pupils are equal, round, and reactive to light. No scleral icterus.  Neck: Neck supple. No tracheal deviation present. No thyromegaly present.  Cardiovascular: Normal rate, regular rhythm, normal heart sounds and intact distal pulses.  Exam reveals no gallop and no friction rub.   No murmur heard. Pulmonary/Chest: Effort normal and breath sounds normal. No respiratory distress. She exhibits no tenderness.  Abdominal: Soft. Bowel sounds are normal. She exhibits no distension and no mass. There is no tenderness. There is no  rebound and no guarding.  Genitourinary:  No cva tenderness  Musculoskeletal: Normal range of motion. She exhibits no edema or tenderness.  ?neck tenderness on exam, difficult to localize to specfiic level, otherwise, CTLS spine, non tender, aligned, no step off. Good passive rom bil ext without apparent pain. No deformity noted. Distal pulses palp bil.   Lymphadenopathy:    She has no cervical adenopathy.  Neurological:  Opens eyes to command.  Does verbalize, but difficult to understand. Moves bil ext purposefully.   Skin: Skin is warm and dry. No rash noted. She is not diaphoretic.  Psychiatric:  Slow to respond.   Nursing note and vitals reviewed.   ED Course  Procedures (including critical care time)  DIAGNOSTIC STUDIES: Oxygen Saturation is 100% on RA, normal by my interpretation.    COORDINATION OF CARE:  12:35 PM Will order CXR and labs. Patient acknowledges and agrees with plan.     Results for  orders placed or performed during the hospital encounter of 08/07/15  CBC  Result Value Ref Range   WBC 12.4 (H) 4.0 - 10.5 K/uL   RBC 3.90 3.87 - 5.11 MIL/uL   Hemoglobin 12.1 12.0 - 15.0 g/dL   HCT 16.1 09.6 - 04.5 %   MCV 97.7 78.0 - 100.0 fL   MCH 31.0 26.0 - 34.0 pg   MCHC 31.8 30.0 - 36.0 g/dL   RDW 40.9 81.1 - 91.4 %   Platelets 248 150 - 400 K/uL  Comprehensive metabolic panel  Result Value Ref Range   Sodium 144 135 - 145 mmol/L   Potassium 4.3 3.5 - 5.1 mmol/L   Chloride 106 101 - 111 mmol/L   CO2 31 22 - 32 mmol/L   Glucose, Bld 104 (H) 65 - 99 mg/dL   BUN 47 (H) 6 - 20 mg/dL   Creatinine, Ser 7.82 (H) 0.44 - 1.00 mg/dL   Calcium 9.3 8.9 - 95.6 mg/dL   Total Protein 5.8 (L) 6.5 - 8.1 g/dL   Albumin 3.4 (L) 3.5 - 5.0 g/dL   AST 19 15 - 41 U/L   ALT 21 14 - 54 U/L   Alkaline Phosphatase 71 38 - 126 U/L   Total Bilirubin 0.5 0.3 - 1.2 mg/dL   GFR calc non Af Amer 32 (L) >60 mL/min   GFR calc Af Amer 38 (L) >60 mL/min   Anion gap 7 5 - 15  Urinalysis,  Routine w reflex microscopic (not at [facility])  Result Value Ref Range   Color, Urine YELLOW YELLOW   APPearance HAZY (A) CLEAR   Specific Gravity, Urine 1.025 1.005 - 1.030   pH 7.0 5.0 - 8.0   Glucose, UA NEGATIVE NEGATIVE mg/dL   Hgb urine dipstick MODERATE (A) NEGATIVE   Bilirubin Urine SMALL (A) NEGATIVE   Ketones, ur NEGATIVE NEGATIVE mg/dL   Protein, ur 213 (A) NEGATIVE mg/dL   Urobilinogen, UA 0.2 0.0 - 1.0 mg/dL   Nitrite POSITIVE (A) NEGATIVE   Leukocytes, UA MODERATE (A) NEGATIVE  Urine microscopic-add on  Result Value Ref Range   Squamous Epithelial / LPF FEW (A) RARE   WBC, UA TOO NUMEROUS TO COUNT <3 WBC/hpf   RBC / HPF 21-50 <3 RBC/hpf   Bacteria, UA MANY (A) RARE   Ct Head Wo Contrast  08/07/2015   CLINICAL DATA:  Old large bruise on the right forehead. Multiple bruises on lower extremities. Altered mental status. Not responsive.  EXAM: CT HEAD WITHOUT CONTRAST  CT CERVICAL SPINE WITHOUT CONTRAST  TECHNIQUE: Multidetector CT imaging of the head and cervical spine was performed following the standard protocol without intravenous contrast. Multiplanar CT image reconstructions of the cervical spine were also generated.  COMPARISON:  03/03/2015  FINDINGS: CT HEAD FINDINGS  There is significant central and cortical atrophy. Periventricular white matter changes are consistent with small vessel disease. There is no intra or extra-axial fluid collection or mass lesion. The basilar cisterns and ventricles have a normal appearance. There is no CT evidence for acute infarction or hemorrhage. Bone windows show right frontal scalp edema without underlying calvarial fracture. Small amount of fluid is identified within the sphenoid air cells. No acute calvarial fracture identified. There is dense atherosclerotic calcification of the internal carotid arteries appear  CT CERVICAL SPINE FINDINGS  There is an acute fracture involving the left anterior and left posterior arch of the C1 ring.  The left  lateral mass is laterally subluxed as a result. There is  a fracture fragment in the C1-2 foramina. There is no significant anterior-posterior subluxation. No other acute fractures are identified. There is significant disc height loss at numerous levels, including C4-5, C5-6, C6-7. There is anterolisthesis of C5 on C6 by 2 mm which is degenerative. Lung apices are unremarkable. The visualized portion of the thyroid gland has a normal appearance.  IMPRESSION: 1. Atrophy and small vessel disease. 2.  No evidence for acute intracranial abnormality. 3. Right frontal scalp edema without acute calvarial fracture. 4. Acute fracture left anterior and left posterior arch of the C1 ring associated with lateral subluxation of the left lateral mass. 5. Degenerative changes in the cervical spine.  Critical Value/emergent results were called by telephone at the time of interpretation on 08/07/2015 at 3:51 pm to Dr. Cathren Laine , who verbally acknowledged these results.   Electronically Signed   By: Norva Pavlov M.D.   On: 08/07/2015 15:51   Ct Cervical Spine Wo Contrast  08/07/2015   CLINICAL DATA:  Old large bruise on the right forehead. Multiple bruises on lower extremities. Altered mental status. Not responsive.  EXAM: CT HEAD WITHOUT CONTRAST  CT CERVICAL SPINE WITHOUT CONTRAST  TECHNIQUE: Multidetector CT imaging of the head and cervical spine was performed following the standard protocol without intravenous contrast. Multiplanar CT image reconstructions of the cervical spine were also generated.  COMPARISON:  03/03/2015  FINDINGS: CT HEAD FINDINGS  There is significant central and cortical atrophy. Periventricular white matter changes are consistent with small vessel disease. There is no intra or extra-axial fluid collection or mass lesion. The basilar cisterns and ventricles have a normal appearance. There is no CT evidence for acute infarction or hemorrhage. Bone windows show right frontal scalp edema without  underlying calvarial fracture. Small amount of fluid is identified within the sphenoid air cells. No acute calvarial fracture identified. There is dense atherosclerotic calcification of the internal carotid arteries appear  CT CERVICAL SPINE FINDINGS  There is an acute fracture involving the left anterior and left posterior arch of the C1 ring.  The left lateral mass is laterally subluxed as a result. There is a fracture fragment in the C1-2 foramina. There is no significant anterior-posterior subluxation. No other acute fractures are identified. There is significant disc height loss at numerous levels, including C4-5, C5-6, C6-7. There is anterolisthesis of C5 on C6 by 2 mm which is degenerative. Lung apices are unremarkable. The visualized portion of the thyroid gland has a normal appearance.  IMPRESSION: 1. Atrophy and small vessel disease. 2.  No evidence for acute intracranial abnormality. 3. Right frontal scalp edema without acute calvarial fracture. 4. Acute fracture left anterior and left posterior arch of the C1 ring associated with lateral subluxation of the left lateral mass. 5. Degenerative changes in the cervical spine.  Critical Value/emergent results were called by telephone at the time of interpretation on 08/07/2015 at 3:51 pm to Dr. Cathren Laine , who verbally acknowledged these results.   Electronically Signed   By: Norva Pavlov M.D.   On: 08/07/2015 15:51   Dg Chest Port 1 View  08/07/2015   CLINICAL DATA:  Cough.  EXAM: PORTABLE CHEST - 1 VIEW  COMPARISON:  March 06, 2015.  FINDINGS: Stable cardiomegaly. No pneumothorax or pleural effusion is noted. Old left rib fractures are noted. Mild degenerative change of right glenohumeral joint is noted. No acute pulmonary disease is noted.  IMPRESSION: No acute cardiopulmonary abnormality seen.   Electronically Signed   By: Lupita Raider,  M.D.   On: 08/07/2015 13:12        I have personally reviewed and evaluated these images and lab  results as part of my medical decision-making.   EKG Interpretation   Date/Time:  Friday August 07 2015 11:56:14 EDT Ventricular Rate:  62 PR Interval:  226 QRS Duration: 72 QT Interval:  520 QTC Calculation: 528 R Axis:   63 Text Interpretation:  Sinus rhythm with prolonged AV conduction Prolonged  QT interval Baseline wander No significant change since last tracing  Confirmed by Denton Lank  MD, Caryn Bee ([id number]) on 08/07/2015 12:33:24 PM      MDM   I personally performed the services described in this documentation, which was scribed in my presence. The recorded information has been reviewed and considered. Cathren Laine, MD  Reviewed nursing notes and prior charts for additional history.   Iv ns bolus. Labs, imaging.  Aspen collar applied.  CT discussed w radiologist.  Will discuss ct results w neurosurgeon on call. Dr Danielle Dess reviewed ct - he indicates would just place in ccollar, no other txd needed.   Tried to reach daughter to discuss goals of care - not currently able to reach.  Caregiver indicates pt/family in past have wanted medical tx, but no cpr/intubation, feeding tube, or other aggressive tx.  Iv ns bollus. Iv abx for uti.   Will admit to hospitalist service - feel pt would benefit from palliative care and/or hospice consult while in hospital.         Cathren Laine, MD 08/07/15 [phone]

## 2015-08-08 DIAGNOSIS — N390 Urinary tract infection, site not specified: Principal | ICD-10-CM

## 2015-08-08 DIAGNOSIS — I5022 Chronic systolic (congestive) heart failure: Secondary | ICD-10-CM

## 2015-08-08 DIAGNOSIS — R296 Repeated falls: Secondary | ICD-10-CM

## 2015-08-08 DIAGNOSIS — G308 Other Alzheimer's disease: Secondary | ICD-10-CM

## 2015-08-08 DIAGNOSIS — R1310 Dysphagia, unspecified: Secondary | ICD-10-CM

## 2015-08-08 DIAGNOSIS — N183 Chronic kidney disease, stage 3 (moderate): Secondary | ICD-10-CM

## 2015-08-08 LAB — BASIC METABOLIC PANEL
Anion gap: 6 (ref 5–15)
BUN: 36 mg/dL — AB (ref 6–20)
CHLORIDE: 111 mmol/L (ref 101–111)
CO2: 26 mmol/L (ref 22–32)
Calcium: 8.7 mg/dL — ABNORMAL LOW (ref 8.9–10.3)
Creatinine, Ser: 0.98 mg/dL (ref 0.44–1.00)
GFR calc Af Amer: 56 mL/min — ABNORMAL LOW (ref >60)
GFR calc non Af Amer: 48 mL/min — ABNORMAL LOW (ref >60)
GLUCOSE: 93 mg/dL (ref 65–99)
POTASSIUM: 3.8 mmol/L (ref 3.5–5.1)
Sodium: 143 mmol/L (ref 135–145)

## 2015-08-08 LAB — CBC
HEMATOCRIT: 37.3 % (ref 36.0–46.0)
Hemoglobin: 11.9 g/dL — ABNORMAL LOW (ref 12.0–15.0)
MCH: 30.7 pg (ref 26.0–34.0)
MCHC: 31.9 g/dL (ref 30.0–36.0)
MCV: 96.1 fL (ref 78.0–100.0)
Platelets: 238 10*3/uL (ref 150–400)
RBC: 3.88 MIL/uL (ref 3.87–5.11)
RDW: 13.3 % (ref 11.5–15.5)
WBC: 10.9 10*3/uL — ABNORMAL HIGH (ref 4.0–10.5)

## 2015-08-08 MED ORDER — HALOPERIDOL LACTATE 5 MG/ML IJ SOLN
1.0000 mg | Freq: Three times a day (TID) | INTRAMUSCULAR | Status: DC
  Administered 2015-08-08 – 2015-08-09 (×3): 1 mg via INTRAVENOUS
  Filled 2015-08-08 (×4): qty 1

## 2015-08-08 MED ORDER — CETYLPYRIDINIUM CHLORIDE 0.05 % MT LIQD
7.0000 mL | Freq: Two times a day (BID) | OROMUCOSAL | Status: DC
  Administered 2015-08-08 – 2015-08-11 (×7): 7 mL via OROMUCOSAL

## 2015-08-08 MED ORDER — HALOPERIDOL LACTATE 5 MG/ML IJ SOLN
1.0000 mg | Freq: Once | INTRAMUSCULAR | Status: AC
Start: 2015-08-08 — End: 2015-08-08
  Administered 2015-08-08: 1 mg via INTRAVENOUS
  Filled 2015-08-08: qty 1

## 2015-08-08 MED ORDER — METOPROLOL TARTRATE 1 MG/ML IV SOLN
5.0000 mg | INTRAVENOUS | Status: DC
  Administered 2015-08-08 – 2015-08-10 (×11): 5 mg via INTRAVENOUS
  Filled 2015-08-08 (×12): qty 5

## 2015-08-08 MED ORDER — ESCITALOPRAM OXALATE 10 MG PO TABS: 10.0000 mg | ORAL_TABLET | Freq: Every day | ORAL | Status: DC

## 2015-08-08 MED ORDER — RISPERIDONE 0.5 MG PO TABS
0.2500 mg | ORAL_TABLET | Freq: Every day | ORAL | Status: DC
Start: 2015-08-09 — End: 2015-08-08

## 2015-08-08 MED ORDER — LORAZEPAM 2 MG/ML IJ SOLN
0.2500 mg | Freq: Three times a day (TID) | INTRAMUSCULAR | Status: DC
  Administered 2015-08-08 – 2015-08-09 (×3): 0.25 mg via INTRAVENOUS
  Filled 2015-08-08 (×4): qty 1

## 2015-08-08 NOTE — Progress Notes (Addendum)
Pt. choking on applesauce and medications crushed in applesauce. Nurse tech also reports pt. choking on pureed diet. Dr. Sherrie Mustache notified. Will put in order for NPO. Will continue to monitor pt.

## 2015-08-08 NOTE — Progress Notes (Signed)
[facility] PROGRESS NOTE  Andrea [name] ZOX:[id number] DOB: 1919/08/03 DOA: 08/07/2015 PCP: Wenda Low, MD    Code Status: DO NOT RESUSCITATE Family Communication: Discussed with patient's caretaker, Kim Disposition Plan: Discharge when clinically appropriate   Consultants:  Palliative care consult pending  Procedures:  None  Antibiotics:  Ceftriaxone 9/16  HPI/Subjective: Patient is sitting up in bed, being fed by Selena Batten, one of her caretakers. Kim request thickened liquids as that is what the patient drinks at home. The patient wants the cervical collar taken off. Otherwise she has no complaints.  Objective: Filed Vitals:   08/07/15 2224  BP:   Pulse: 81  Temp:   Resp: 20   temperature 97.8. Pulse 81. Respiratory rate 20. Blood pressure 169/75. Oxygen saturation 93%. No intake or output data in the 24 hours ending 08/08/15 1026 Filed Weights   08/07/15 1906  Weight: 40.143 kg (88 lb 8 oz)    Exam:   General:  Frail 81 year old Caucasian woman, lying in bed, in no acute distress.  Neck: Cervical collar is in place.  Cardiovascular: S1, S2, with a 2/6 systolic murmur.  Respiratory: Difficult exam due to kyphosis, but clear anteriorly with decreased breath sounds in the bases.  Abdomen: Positive bowel sounds, soft, nontender, nondistended.  Musculoskeletal/extremities: Diffuse muscle atrophy; diffuse arthritic changes in her hands, feet, and knees with hypertrophic changes, but no acute hot red joints. No pedal edema.  Neurologic: The patient is alert and oriented to her name. She is confused and is not oriented to time place or year.   Data Reviewed: Basic Metabolic Panel:  Recent Labs Lab 08/07/15 1248 08/08/15 0458  NA 144 143  K 4.3 3.8  CL 106 111  CO2 31 26  GLUCOSE 104* 93  BUN 47* 36*  CREATININE 1.36* 0.98  CALCIUM 9.3 8.7*   Liver Function Tests:  Recent Labs Lab 08/07/15 1248  AST 19  ALT 21  ALKPHOS 71  BILITOT 0.5   PROT 5.8*  ALBUMIN 3.4*   No results for input(s): LIPASE, AMYLASE in the last 168 hours. No results for input(s): AMMONIA in the last 168 hours. CBC:  Recent Labs Lab 08/07/15 1248 08/08/15 0458  WBC 12.4* 10.9*  HGB 12.1 11.9*  HCT 38.1 37.3  MCV 97.7 96.1  PLT 248 238   Cardiac Enzymes: No results for input(s): CKTOTAL, CKMB, CKMBINDEX, TROPONINI in the last 168 hours. BNP (last 3 results) No results for input(s): BNP in the last 8760 hours.  ProBNP (last 3 results)  Recent Labs  09/12/14 0102 10/01/14 0938  PROBNP 9765.0* 1346.0*    CBG: No results for input(s): GLUCAP in the last 168 hours.  No results found for this or any previous visit (from the past 240 hour(s)).   Studies: Ct Head Wo Contrast  08/07/2015   CLINICAL DATA:  Old large bruise on the right forehead. Multiple bruises on lower extremities. Altered mental status. Not responsive.  EXAM: CT HEAD WITHOUT CONTRAST  CT CERVICAL SPINE WITHOUT CONTRAST  TECHNIQUE: Multidetector CT imaging of the head and cervical spine was performed following the standard protocol without intravenous contrast. Multiplanar CT image reconstructions of the cervical spine were also generated.  COMPARISON:  03/03/2015  FINDINGS: CT HEAD FINDINGS  There is significant central and cortical atrophy. Periventricular white matter changes are consistent with small vessel disease. There is no intra or extra-axial fluid collection or mass lesion. The basilar cisterns and ventricles have a normal appearance. There is no CT evidence for acute  infarction or hemorrhage. Bone windows show right frontal scalp edema without underlying calvarial fracture. Small amount of fluid is identified within the sphenoid air cells. No acute calvarial fracture identified. There is dense atherosclerotic calcification of the internal carotid arteries appear  CT CERVICAL SPINE FINDINGS  There is an acute fracture involving the left anterior and left posterior arch of  the C1 ring.  The left lateral mass is laterally subluxed as a result. There is a fracture fragment in the C1-2 foramina. There is no significant anterior-posterior subluxation. No other acute fractures are identified. There is significant disc height loss at numerous levels, including C4-5, C5-6, C6-7. There is anterolisthesis of C5 on C6 by 2 mm which is degenerative. Lung apices are unremarkable. The visualized portion of the thyroid gland has a normal appearance.  IMPRESSION: 1. Atrophy and small vessel disease. 2.  No evidence for acute intracranial abnormality. 3. Right frontal scalp edema without acute calvarial fracture. 4. Acute fracture left anterior and left posterior arch of the C1 ring associated with lateral subluxation of the left lateral mass. 5. Degenerative changes in the cervical spine.  Critical Value/emergent results were called by telephone at the time of interpretation on 08/07/2015 at 3:51 pm to Dr. Cathren Laine , who verbally acknowledged these results.   Electronically Signed   By: Norva Pavlov M.D.   On: 08/07/2015 15:51   Ct Cervical Spine Wo Contrast  08/07/2015   CLINICAL DATA:  Old large bruise on the right forehead. Multiple bruises on lower extremities. Altered mental status. Not responsive.  EXAM: CT HEAD WITHOUT CONTRAST  CT CERVICAL SPINE WITHOUT CONTRAST  TECHNIQUE: Multidetector CT imaging of the head and cervical spine was performed following the standard protocol without intravenous contrast. Multiplanar CT image reconstructions of the cervical spine were also generated.  COMPARISON:  03/03/2015  FINDINGS: CT HEAD FINDINGS  There is significant central and cortical atrophy. Periventricular white matter changes are consistent with small vessel disease. There is no intra or extra-axial fluid collection or mass lesion. The basilar cisterns and ventricles have a normal appearance. There is no CT evidence for acute infarction or hemorrhage. Bone windows show right frontal  scalp edema without underlying calvarial fracture. Small amount of fluid is identified within the sphenoid air cells. No acute calvarial fracture identified. There is dense atherosclerotic calcification of the internal carotid arteries appear  CT CERVICAL SPINE FINDINGS  There is an acute fracture involving the left anterior and left posterior arch of the C1 ring.  The left lateral mass is laterally subluxed as a result. There is a fracture fragment in the C1-2 foramina. There is no significant anterior-posterior subluxation. No other acute fractures are identified. There is significant disc height loss at numerous levels, including C4-5, C5-6, C6-7. There is anterolisthesis of C5 on C6 by 2 mm which is degenerative. Lung apices are unremarkable. The visualized portion of the thyroid gland has a normal appearance.  IMPRESSION: 1. Atrophy and small vessel disease. 2.  No evidence for acute intracranial abnormality. 3. Right frontal scalp edema without acute calvarial fracture. 4. Acute fracture left anterior and left posterior arch of the C1 ring associated with lateral subluxation of the left lateral mass. 5. Degenerative changes in the cervical spine.  Critical Value/emergent results were called by telephone at the time of interpretation on 08/07/2015 at 3:51 pm to Dr. Cathren Laine , who verbally acknowledged these results.   Electronically Signed   By: Norva Pavlov M.D.   On: 08/07/2015 15:51  Dg Chest Port 1 View  08/07/2015   CLINICAL DATA:  Cough.  EXAM: PORTABLE CHEST - 1 VIEW  COMPARISON:  March 06, 2015.  FINDINGS: Stable cardiomegaly. No pneumothorax or pleural effusion is noted. Old left rib fractures are noted. Mild degenerative change of right glenohumeral joint is noted. No acute pulmonary disease is noted.  IMPRESSION: No acute cardiopulmonary abnormality seen.   Electronically Signed   By: Lupita Raider, M.D.   On: 08/07/2015 13:12    Scheduled Meds: . sodium chloride   Intravenous STAT   . antiseptic oral rinse  7 mL Mouth Rinse BID  . buPROPion  300 mg Oral Daily  . cefTRIAXone (ROCEPHIN)  IV  1 g Intravenous Q24H  . clonazePAM  0.5 mg Oral TID  . enoxaparin (LOVENOX) injection  30 mg Subcutaneous Q24H  . escitalopram  20 mg Oral Daily  . feeding supplement (ENSURE)  1 Container Oral TID BM  . metoprolol tartrate  12.5 mg Oral BID  . QUEtiapine  25 mg Oral TID  . risperiDONE  0.25 mg Oral BID  . Umeclidinium-Vilanterol  1 puff Inhalation Once per day on Sun Tue Thu Sat   Continuous Infusions: . sodium chloride 100 mL/hr at 08/07/15 2134    Assessment and Plan:  Principal Problem:   Urinary tract infection Active Problems:   Recurrent falls   Fracture of C1 vertebra, closed   Essential hypertension   Chronic systolic heart failure   Dysphagia   CKD (chronic kidney disease), stage III   Protein-calorie malnutrition, severe   Alzheimer's dementia with behavioral disturbance   Pressure ulcer    1. Urinary tract infection.  Urine culture is pending. The patient was started on ceftriaxone and IV fluids. We'll await the urine culture and continue antibiotic therapy.  Recurrent falls at home with resultant C1 fracture. Apparently, the ED physician discussed the patient with neurosurgery. They recommended a cervical collar. She has it in place and will be continued. -We'll order a TSH for further evaluation.  Chronic systolic heart failure. Patient's 2-D echocardiogram October 2015 revealed an EF of 35-40%. Her chest x-ray on admission revealed no acute cardiopulmonary abnormalities. Given her low EF, will decrease the rate of her IV fluids. We'll continue metoprolol. Per her chart review, she is not on diuretic therapy.  Hypertension. Patient's blood pressures moderately elevated. Will continue metoprolol for now. Will titrate up if needed.  Stage II to stage III chronic kidney disease with mild acute on chronic renal injury. Patient's creatinine was 1.36  on admission. Following IV fluid hydration, it has normalized to 0.98.  Alzheimer's dementia with history of behavioral disturbance and depression with anxiety. The patient is treated with multiple psychotropic medications including Wellbutrin, Lexapro, Seroquel, Risperdal, and clonazepam. -I wonder if her frequent falls were secondary to these medications. She is now practically nonambulatory. -She is on 2 and a psychotic medications: Seroquel and Risperdal; will decrease the dose of Lexapro secondary to pharmacy recommendations and will decrease Risperdal 2 daily at bedtime rather than twice a day.  Chronic dysphagia. Per the patient's caretaker, she is on thickened liquids at home. Will change diet to dysphagia 2 with nectar thickened liquids.  Time spent: 35 minutes    [facility]  [facility] Pager [phone]. If 7PM-7AM, please contact night-coverage at [email or web address], password [facility] 08/08/2015, 10:26 AM  LOS: 1 day

## 2015-08-09 DIAGNOSIS — S12000D Unspecified displaced fracture of first cervical vertebra, subsequent encounter for fracture with routine healing: Secondary | ICD-10-CM

## 2015-08-09 LAB — BASIC METABOLIC PANEL
Anion gap: 6 (ref 5–15)
BUN: 26 mg/dL — AB (ref 6–20)
CHLORIDE: 109 mmol/L (ref 101–111)
CO2: 26 mmol/L (ref 22–32)
CREATININE: 0.8 mg/dL (ref 0.44–1.00)
Calcium: 8.3 mg/dL — ABNORMAL LOW (ref 8.9–10.3)
GFR calc non Af Amer: >60 mL/min (ref >60)
Glucose, Bld: 87 mg/dL (ref 65–99)
POTASSIUM: 3.2 mmol/L — AB (ref 3.5–5.1)
SODIUM: 141 mmol/L (ref 135–145)

## 2015-08-09 LAB — CBC
HEMATOCRIT: 32.8 % — AB (ref 36.0–46.0)
HEMOGLOBIN: 10.7 g/dL — AB (ref 12.0–15.0)
MCH: 31 pg (ref 26.0–34.0)
MCHC: 32.6 g/dL (ref 30.0–36.0)
MCV: 95.1 fL (ref 78.0–100.0)
Platelets: 239 10*3/uL (ref 150–400)
RBC: 3.45 MIL/uL — AB (ref 3.87–5.11)
RDW: 13.2 % (ref 11.5–15.5)
WBC: 6.9 10*3/uL (ref 4.0–10.5)

## 2015-08-09 MED ORDER — ESCITALOPRAM OXALATE 10 MG PO TABS
10.0000 mg | ORAL_TABLET | Freq: Every day | ORAL | Status: DC
  Administered 2015-08-10 – 2015-08-11 (×2): 10 mg via ORAL
  Filled 2015-08-09 (×2): qty 1

## 2015-08-09 MED ORDER — KCL IN DEXTROSE-NACL 40-5-0.9 MEQ/L-%-% IV SOLN
INTRAVENOUS | Status: DC
  Administered 2015-08-09: 11:00:00 via INTRAVENOUS
  Filled 2015-08-09 (×5): qty 1000

## 2015-08-09 MED ORDER — CLONIDINE HCL 0.1 MG/24HR TD PTWK
0.1000 mg | MEDICATED_PATCH | TRANSDERMAL | Status: DC
  Administered 2015-08-09: 0.1 mg via TRANSDERMAL
  Filled 2015-08-09: qty 1

## 2015-08-09 MED ORDER — QUETIAPINE FUMARATE 25 MG PO TABS
25.0000 mg | ORAL_TABLET | Freq: Three times a day (TID) | ORAL | Status: DC
  Administered 2015-08-09 – 2015-08-11 (×7): 25 mg via ORAL
  Filled 2015-08-09 (×7): qty 1

## 2015-08-09 MED ORDER — DEXTROSE 5 % IV SOLN
INTRAVENOUS | Status: AC
  Filled 2015-08-09: qty 10

## 2015-08-09 MED ORDER — CLONIDINE HCL 0.2 MG/24HR TD PTWK: 0.2000 mg | MEDICATED_PATCH | TRANSDERMAL | Status: DC

## 2015-08-09 MED ORDER — CLONIDINE HCL 0.1 MG/24HR TD PTWK: 0.1000 mg | MEDICATED_PATCH | TRANSDERMAL | Status: DC

## 2015-08-09 MED ORDER — CLONAZEPAM 0.5 MG PO TABS
0.5000 mg | ORAL_TABLET | Freq: Two times a day (BID) | ORAL | Status: DC
  Administered 2015-08-09 – 2015-08-11 (×4): 0.5 mg via ORAL
  Filled 2015-08-09 (×4): qty 1

## 2015-08-09 MED ORDER — UMECLIDINIUM-VILANTEROL 62.5-25 MCG/INH IN AEPB
1.0000 | INHALATION_SPRAY | RESPIRATORY_TRACT | Status: DC
  Filled 2015-08-09 (×2): qty 60

## 2015-08-09 MED ORDER — HALOPERIDOL LACTATE 5 MG/ML IJ SOLN
0.5000 mg | Freq: Four times a day (QID) | INTRAMUSCULAR | Status: DC | PRN
  Administered 2015-08-09 – 2015-08-11 (×2): 0.5 mg via INTRAVENOUS
  Filled 2015-08-09 (×2): qty 1

## 2015-08-09 MED ORDER — RISPERIDONE 0.5 MG PO TABS
0.2500 mg | ORAL_TABLET | Freq: Two times a day (BID) | ORAL | Status: DC
  Administered 2015-08-09 – 2015-08-11 (×4): 0.25 mg via ORAL
  Filled 2015-08-09 (×4): qty 1

## 2015-08-09 MED ORDER — BUPROPION HCL ER (XL) 300 MG PO TB24
300.0000 mg | ORAL_TABLET | Freq: Every day | ORAL | Status: DC
  Administered 2015-08-10 – 2015-08-11 (×2): 300 mg via ORAL
  Filled 2015-08-09 (×3): qty 1

## 2015-08-09 NOTE — Progress Notes (Signed)
Per verbal order from Dr. Sherrie Mustache, it is ok to take C-collar off to allow patient to eat and take PO meds.

## 2015-08-09 NOTE — Progress Notes (Signed)
[facility] PROGRESS NOTE  Andrea [name] UJW:[id number] DOB: [birth year] DOA: 08/07/2015 PCP: Wenda Low, MD    Code Status: DO NOT RESUSCITATE Family Communication: Discussed with patient's daughter Miss Read and caretaker Dawn. Disposition Plan: Discharge when clinically appropriate   Consultants:  Hospice pending  Procedures:  None  Antibiotics:  Ceftriaxone 9/16>>  HPI/Subjective: Patient is currently sleeping, but is arousable. She was recently given Haldol. She had a restful night per nursing. Yesterday afternoon, she was made to be nothing by mouth when the nursing staff reported that the patient had been choking and gagging on applesauce and her medications.  Objective: Filed Vitals:   08/09/15 0619  BP: 172/78  Pulse: 57  Temp: 98 F (36.7 C)  Resp: 20    Oxygen saturation 96% on room air.  Intake/Output Summary (Last 24 hours) at 08/09/15 1127 Last data filed at 08/09/15 0900  Gross per 24 hour  Intake    150 ml  Output      0 ml  Net    150 ml   Filed Weights   08/07/15 1906  Weight: 40.143 kg (88 lb 8 oz)    Exam:   General:  Frail 81 year old Caucasian woman, lying in bed, in no acute distress. She is asleep, but arousable.  Neck: Cervical collar is in place.  Cardiovascular: S1, S2, with a 2/6 systolic murmur.  Respiratory: Lungs clear anteriorly with decreased breath sounds in the bases.  Abdomen: Positive bowel sounds, soft, nontender, nondistended.  Musculoskeletal/extremities: Diffuse muscle atrophy; diffuse arthritic changes in her hands, feet, and knees with hypertrophic changes, but no acute hot red joints. No pedal edema.  Neurologic: The patient is sleeping, but becomes arousable to name and provocation. She had just been given Haldol so she is sedated. Per nursing, she was alert earlier.  Data Reviewed: Basic Metabolic Panel:  Recent Labs Lab 08/07/15 1248 08/08/15 0458 08/09/15 0601  NA 144 143 141  K 4.3  3.8 3.2*  CL 106 111 109  CO2 GLUCOSE 104* 93 87  BUN 47* 36* 26*  CREATININE 1.36* 0.98 0.80  CALCIUM 9.3 8.7* 8.3*   Liver Function Tests:  Recent Labs Lab 08/07/15 1248  AST 19  ALT 21  ALKPHOS 71  BILITOT 0.5  PROT 5.8*  ALBUMIN 3.4*   No results for input(s): LIPASE, AMYLASE in the last 168 hours. No results for input(s): AMMONIA in the last 168 hours. CBC:  Recent Labs Lab 08/07/15 1248 08/08/15 0458 08/09/15 0601  WBC 12.4* 10.9* 6.9  HGB 12.1 11.9* 10.7*  HCT 38.1 37.3 32.8*  MCV 97.7 96.1 95.1  PLT 248 238 239   Cardiac Enzymes: No results for input(s): CKTOTAL, CKMB, CKMBINDEX, TROPONINI in the last 168 hours. BNP (last 3 results) No results for input(s): BNP in the last 8760 hours.  ProBNP (last 3 results)  Recent Labs  09/12/14 0102 10/01/14 0938  PROBNP 9765.0* 1346.0*    CBG: No results for input(s): GLUCAP in the last 168 hours.  No results found for this or any previous visit (from the past 240 hour(s)).   Studies: Ct Head Wo Contrast  08/07/2015   CLINICAL DATA:  Old large bruise on the right forehead. Multiple bruises on lower extremities. Altered mental status. Not responsive.  EXAM: CT HEAD WITHOUT CONTRAST  CT CERVICAL SPINE WITHOUT CONTRAST  TECHNIQUE: Multidetector CT imaging of the head and cervical spine was performed following the standard protocol without intravenous contrast. Multiplanar CT image reconstructions  of the cervical spine were also generated.  COMPARISON:  03/03/2015  FINDINGS: CT HEAD FINDINGS  There is significant central and cortical atrophy. Periventricular white matter changes are consistent with small vessel disease. There is no intra or extra-axial fluid collection or mass lesion. The basilar cisterns and ventricles have a normal appearance. There is no CT evidence for acute infarction or hemorrhage. Bone windows show right frontal scalp edema without underlying calvarial fracture. Small amount of fluid  is identified within the sphenoid air cells. No acute calvarial fracture identified. There is dense atherosclerotic calcification of the internal carotid arteries appear  CT CERVICAL SPINE FINDINGS  There is an acute fracture involving the left anterior and left posterior arch of the C1 ring.  The left lateral mass is laterally subluxed as a result. There is a fracture fragment in the C1-2 foramina. There is no significant anterior-posterior subluxation. No other acute fractures are identified. There is significant disc height loss at numerous levels, including C4-5, C5-6, C6-7. There is anterolisthesis of C5 on C6 by 2 mm which is degenerative. Lung apices are unremarkable. The visualized portion of the thyroid gland has a normal appearance.  IMPRESSION: 1. Atrophy and small vessel disease. 2.  No evidence for acute intracranial abnormality. 3. Right frontal scalp edema without acute calvarial fracture. 4. Acute fracture left anterior and left posterior arch of the C1 ring associated with lateral subluxation of the left lateral mass. 5. Degenerative changes in the cervical spine.  Critical Value/emergent results were called by telephone at the time of interpretation on 08/07/2015 at 3:51 pm to Dr. Cathren Laine , who verbally acknowledged these results.   Electronically Signed   By: Norva Pavlov M.D.   On: 08/07/2015 15:51   Ct Cervical Spine Wo Contrast  08/07/2015   CLINICAL DATA:  Old large bruise on the right forehead. Multiple bruises on lower extremities. Altered mental status. Not responsive.  EXAM: CT HEAD WITHOUT CONTRAST  CT CERVICAL SPINE WITHOUT CONTRAST  TECHNIQUE: Multidetector CT imaging of the head and cervical spine was performed following the standard protocol without intravenous contrast. Multiplanar CT image reconstructions of the cervical spine were also generated.  COMPARISON:  03/03/2015  FINDINGS: CT HEAD FINDINGS  There is significant central and cortical atrophy. Periventricular white  matter changes are consistent with small vessel disease. There is no intra or extra-axial fluid collection or mass lesion. The basilar cisterns and ventricles have a normal appearance. There is no CT evidence for acute infarction or hemorrhage. Bone windows show right frontal scalp edema without underlying calvarial fracture. Small amount of fluid is identified within the sphenoid air cells. No acute calvarial fracture identified. There is dense atherosclerotic calcification of the internal carotid arteries appear  CT CERVICAL SPINE FINDINGS  There is an acute fracture involving the left anterior and left posterior arch of the C1 ring.  The left lateral mass is laterally subluxed as a result. There is a fracture fragment in the C1-2 foramina. There is no significant anterior-posterior subluxation. No other acute fractures are identified. There is significant disc height loss at numerous levels, including C4-5, C5-6, C6-7. There is anterolisthesis of C5 on C6 by 2 mm which is degenerative. Lung apices are unremarkable. The visualized portion of the thyroid gland has a normal appearance.  IMPRESSION: 1. Atrophy and small vessel disease. 2.  No evidence for acute intracranial abnormality. 3. Right frontal scalp edema without acute calvarial fracture. 4. Acute fracture left anterior and left posterior arch of the C1  ring associated with lateral subluxation of the left lateral mass. 5. Degenerative changes in the cervical spine.  Critical Value/emergent results were called by telephone at the time of interpretation on 08/07/2015 at 3:51 pm to Dr. Cathren Laine , who verbally acknowledged these results.   Electronically Signed   By: Norva Pavlov M.D.   On: 08/07/2015 15:51   Dg Chest Port 1 View  08/07/2015   CLINICAL DATA:  Cough.  EXAM: PORTABLE CHEST - 1 VIEW  COMPARISON:  March 06, 2015.  FINDINGS: Stable cardiomegaly. No pneumothorax or pleural effusion is noted. Old left rib fractures are noted. Mild  degenerative change of right glenohumeral joint is noted. No acute pulmonary disease is noted.  IMPRESSION: No acute cardiopulmonary abnormality seen.   Electronically Signed   By: Lupita Raider, M.D.   On: 08/07/2015 13:12    Scheduled Meds: . antiseptic oral rinse  7 mL Mouth Rinse BID  . [START ON 08/10/2015] buPROPion  300 mg Oral Daily  . cefTRIAXone (ROCEPHIN)  IV  1 g Intravenous Q24H  . clonazePAM  0.5 mg Oral BID  . [START ON 08/16/2015] cloNIDine  0.1 mg Transdermal Weekly  . enoxaparin (LOVENOX) injection  30 mg Subcutaneous Q24H  . [START ON 08/10/2015] escitalopram  10 mg Oral Daily  . metoprolol  5 mg Intravenous Q4H  . QUEtiapine  25 mg Oral TID  . risperiDONE  0.25 mg Oral BID  . Umeclidinium-Vilanterol  1 puff Inhalation Once per day on Sun Tue Thu Sat   Continuous Infusions: . dextrose 5 % and 0.9 % NaCl with KCl 40 mEq/L 65 mL/hr at 08/09/15 1040    Assessment and Plan:  Principal Problem:   Urinary tract infection Active Problems:   Recurrent falls   Fracture of C1 vertebra, closed   Essential hypertension   Chronic systolic heart failure   Dysphagia   CKD (chronic kidney disease), stage III   Protein-calorie malnutrition, severe   Alzheimer's dementia with behavioral disturbance   Pressure ulcer    1. Urinary tract infection.  Urine culture is pending. The patient was started on ceftriaxone and IV fluids. We'll await the urine culture and continue antibiotic therapy. -She is afebrile and her white blood cell count has improved.  Recurrent falls at home with resultant C1 fracture. Apparently, the ED physician discussed the patient with neurosurgery. They recommended a cervical collar. She has it in place and will be continued. -CT of her head revealed no acute intracranial findings, but with atrophy and small vessel disease.   Chronic systolic heart failure. Patient's 2-D echocardiogram October 2015 revealed an EF of 35-40%. Her chest x-ray on  admission revealed no acute cardiopulmonary abnormalities. Given her low EF, her IV fluid rate was decreased. Her metoprolol was changed to IV because of dysphagia. Per her chart review, she is not on diuretic therapy.  Hypertension. Patient's blood pressure is  moderately elevated. Will continue metoprolol. Catapres patch was added.  Stage II to stage III chronic kidney disease with mild acute on chronic renal injury. Patient's creatinine was 1.36 on admission. Following IV fluid hydration, it has normalized.  Hypokalemia.  Her serum potassium is following with IV fluids. Therefore, both dextrose and potassium chloride were added. We'll continue to monitor.  Alzheimer's dementia with history of behavioral disturbance and depression with anxiety. The patient is treated with multiple psychotropic medications including Wellbutrin, Lexapro, Seroquel, Risperdal, and clonazepam. -I wonder if her frequent falls were secondary to these medications. She is  now practically nonambulatory. -She is on 2  anti-psychotic medications: Seroquel and Risperdal.. This was discussed with her daughter Mrs. Read and her caretaker Alvis Lemmings. Apparently, Risperdal was recently added because the patient had continued agitation and anxiety at home. The addition of Risperdal seemed to have helped.  -Because the patient was made nothing by mouth due to apparent worsening dysphagia on 9/17, her anti-psychotic medications were withheld, but she was started on IV Haldol and IV Ativan in place of clonazepam. Although I explained this to Mrs. Read, both she and Dawn preferred that the patient's oral psychotropic medications be restarted even though she is at risk of aspiration. I informed them that the cervical collar may be impeding her swallowing ability, but they felt that the patient was likely very anxious yesterday because she had not received her psychotropic medications. Therefore, per their request and in the setting of known  aspiration risk, her psychotropic medications were restarted. The dose of Lexapro was decreased due to pharmacy recommendations.  Hospice referral On admission, apparently there was a request for hospice referral. Her daughter, Mrs. Read stated that the patient had a hospice referral visited at home a few weeks ago, but it was not clear if they would be of help. Will keep the hospice consult order in place for another discussion.  Chronic dysphagia. The patient was made nothing by mouth on 9/17 with the nursing staff reported choking/gagging episodes on applesauce and medications. At home, she is chronically treated with thickened liquids. Per conversation with her daughter and caretaker, dysphagia 2 with nectar thickened liquid diet will be restarted with known aspiration risk. Per her caretaker, the patient has anxiousness when other individuals that she does not know try to feed her. She believes deceased reasons why the patient was having difficulty swallowing. I informed them that the cervical collar may be interfering with her swallowing ability and could be taken off during times of eating. Nevertheless, speech therapy consult/evaluation has been ordered.    Time spent: 30 minutes    [facility]  [facility] Pager [phone]. If 7PM-7AM, please contact night-coverage at [email or web address], password [facility] 08/09/2015, 11:27 AM  LOS: 2 days

## 2015-08-10 DIAGNOSIS — E43 Unspecified severe protein-calorie malnutrition: Secondary | ICD-10-CM

## 2015-08-10 LAB — BASIC METABOLIC PANEL
ANION GAP: 4 — AB (ref 5–15)
BUN: 17 mg/dL (ref 6–20)
CALCIUM: 8.4 mg/dL — AB (ref 8.9–10.3)
CO2: 24 mmol/L (ref 22–32)
CREATININE: 0.7 mg/dL (ref 0.44–1.00)
Chloride: 110 mmol/L (ref 101–111)
GFR calc Af Amer: >60 mL/min (ref >60)
GLUCOSE: 90 mg/dL (ref 65–99)
Potassium: 4.2 mmol/L (ref 3.5–5.1)
Sodium: 138 mmol/L (ref 135–145)

## 2015-08-10 LAB — URINE CULTURE

## 2015-08-10 MED ORDER — INFLUENZA VAC SPLIT QUAD 0.5 ML IM SUSY
0.5000 mL | PREFILLED_SYRINGE | INTRAMUSCULAR | Status: DC
  Filled 2015-08-10: qty 0.5

## 2015-08-10 MED ORDER — METOPROLOL TARTRATE 25 MG PO TABS
25.0000 mg | ORAL_TABLET | Freq: Two times a day (BID) | ORAL | Status: DC
  Administered 2015-08-10 – 2015-08-11 (×3): 25 mg via ORAL
  Filled 2015-08-10 (×4): qty 1

## 2015-08-10 MED ORDER — POLYETHYLENE GLYCOL 3350 17 G PO PACK
17.0000 g | PACK | ORAL | Status: DC
  Filled 2015-08-10: qty 1

## 2015-08-10 MED ORDER — MORPHINE SULFATE (PF) 2 MG/ML IV SOLN
0.5000 mg | INTRAVENOUS | Status: DC | PRN
Start: 2015-08-10 — End: 2015-08-11
  Administered 2015-08-10: 0.5 mg via INTRAVENOUS
  Administered 2015-08-10: 1 mg via INTRAVENOUS
  Administered 2015-08-11: 0.5 mg via INTRAVENOUS
  Filled 2015-08-10 (×4): qty 1

## 2015-08-10 MED ORDER — HYDRALAZINE HCL 20 MG/ML IJ SOLN
5.0000 mg | Freq: Four times a day (QID) | INTRAMUSCULAR | Status: DC | PRN
  Administered 2015-08-11: 5 mg via INTRAVENOUS
  Filled 2015-08-10: qty 1

## 2015-08-10 NOTE — Clinical Social Work Note (Signed)
CSW received consult for possible residential hospice placement. Palliative care consulted today to help establish goals of care. CSW will follow up when appropriate.  Derenda Fennel, LCSW [phone]

## 2015-08-10 NOTE — Evaluation (Signed)
Clinical/Bedside Swallow Evaluation Patient Details  Name: Aquila Menzie MRN: [id number] Date of Birth: 07/13/1919  Today's Date: 08/10/2015 Time: SLP Start Time (ACUTE ONLY): 1136 SLP Stop Time (ACUTE ONLY): 1210 SLP Time Calculation (min) (ACUTE ONLY): 34 min  Past Medical History:  Past Medical History  Diagnosis Date  . COPD (chronic obstructive pulmonary disease)   . Alzheimer's dementia   . Depression   . Chronic kidney disease   . Hypertension   . Dementia with behavioral disturbance 08/26/2014  . Acute respiratory failure 09/12/2014  . Pneumonia 09/12/2014  . L2 vertebral fracture 08/25/2014  . Hip fracture 03/03/2015  . Flash pulmonary edema 09/14/2014  . Orthostatic hypotension 08/26/2014  . Right hip pain 03/03/2015   Past Surgical History:  Past Surgical History  Procedure Laterality Date  . Intramedullary (im) nail intertrochanteric Right 03/04/2015    Procedure: INTRAMEDULLARY (IM) NAIL INTERTROCHANTRIC;  Surgeon: Eldred Manges, MD;  Location: [facility] OR;  Service: Orthopedics;  Laterality: Right;   HPI:  Arrielle Mcginn is a 81 y.o. female with Alzheimer's disease with behavioral disturbance, COPD, hypertension, CHF, recent hip fracture. Patient is bed bound with urinary incontinence. She reportedly had a fall 6 days ago, although this was not communicated with the ED physician when she was brought here. She has had increasing neck pain over the past 5 days, worse with movement. The caregiver notes that she has had increasing somnolence during the day over the past several days which is worsened. She is wearing a cervical collar due to recent fall. She reportedly was choking on foods/liquids yesterday and a caregiver reported that she consumed nectar-thickened liquids at home. SLP asked to evaluate swallow. Palliative care consult pending.    Assessment / Plan / Recommendation Clinical Impression  Ms. Duell was seen at bedside with caregiver present. Caregiver  reports that pt has declined a lot over the past few months and especially in the last week (and shows me pictures of her looking alert and holding her dog a couple weeks ago). She was hospitalized at [facility] last spring and was discharged home on a puree diet with NTL, however this caregiver reports that pt has been tolerating a mech soft diet with thin liquids at home in the recent past. One caregiver who comes in the evening gives her NTL. Pt only intermittently alert for me during this evaluation. She was verbally agreeable to repositioning in bed and po trials, however she required max cues participate in po trials. Swallow response very delayed, however seemed more related to decreased attention and alertness. Caregiver asked SLP whether she seemed to be aspirating or not and I shared that this evaluation limited by poor pt responsiveness to accurately tell at this time. She is certainly at risk for aspiration given dementia, advanced age with medical decline, recent fall with need for cervical collar, and decreased alertness. Caregiver shares that daughter has a very hard time seeing her mom decline and rarely visists with her. If comfort care is pursued, would recommend mech soft diet with thin liquids if patient requests. For now, continue diet as ordered: D2 and NTL given decreased alertness. SLP will follow tomorrow per goals of care. Palliative care has been consulted.    Aspiration Risk  Moderate    Diet Recommendation Nectar;Dysphagia 2 (Fine chop) (Pt not alert enough for sufficient trials; cont diet ordered by MD)   Medication Administration: Whole meds with puree Compensations: Slow rate (feed only when alert and pt willing)    Other  Recommendations Oral Care Recommendations: Oral care before and after PO;Staff/trained caregiver to provide oral care Other Recommendations: Clarify dietary restrictions   Follow Up Recommendations       Frequency and Duration min 2x/week  1 week    Pertinent Vitals/Pain VSS    SLP Swallow Goals   Pt will demonstrate safe and efficient consumption of least restrictive diet with use of strategies as needed.    Swallow Study Prior Functional Status       General Date of Onset: 08/07/15 Other Pertinent Information: Gentri Guardado is a 81 y.o. female with Alzheimer's disease with behavioral disturbance, COPD, hypertension, CHF, recent hip fracture. Patient is bed bound with urinary incontinence. She reportedly had a fall 6 days ago, although this was not communicated with the ED physician when she was brought here. She has had increasing neck pain over the past 5 days, worse with movement. The caregiver notes that she has had increasing somnolence during the day over the past several days which is worsened. She is wearing a cervical collar due to recent fall. She reportedly was choking on foods/liquids yesterday and a caregiver reported that she consumed nectar-thickened liquids at home. SLP asked to evaluate swallow. Palliative care consult pending.  Type of Study: Bedside swallow evaluation Previous Swallow Assessment: Seen at [facility] last spring and placed on puree/NTL Diet Prior to this Study: Dysphagia 2 (chopped);Nectar-thick liquids Temperature Spikes Noted: No History of Recent Intubation: No Behavior/Cognition: Lethargic/Drowsy Oral Cavity - Dentition: Adequate natural dentition/normal for age Self-Feeding Abilities: Total assist Patient Positioning: Upright in bed Baseline Vocal Quality: Normal Volitional Cough: Cognitively unable to elicit Volitional Swallow: Unable to elicit    Oral/Motor/Sensory Function Overall Oral Motor/Sensory Function: Other (comment) (Pt with decreased alertness; follows minimal commands)   Ice Chips Ice chips: Impaired Presentation: Spoon Oral Phase Impairments: Reduced lingual movement/coordination;Poor awareness of bolus Other Comments: no swallow response   Thin Liquid Thin Liquid:  Not tested    Nectar Thick Nectar Thick Liquid: Impaired Presentation: Straw Oral Phase Impairments: Reduced labial seal;Reduced lingual movement/coordination;Impaired anterior to posterior transit;Poor awareness of bolus   Honey Thick Honey Thick Liquid: Not tested   Puree Puree: Impaired Presentation: Spoon Oral Phase Impairments: Poor awareness of bolus   Solid   Thank you,  Havery Moros, CCC-SLP [phone]     Solid: Not tested       PORTER,DABNEY 08/10/2015,12:57 PM

## 2015-08-10 NOTE — Care Management Note (Signed)
Case Management Note  Patient Details  Name: Andrea [name] MRN: [id number] Date of Birth: Feb 02, 1919  Subjective/Objective:                  Pt admitted from home with UTI and C1 fracture. Pt lives alone with 24 hour caregivers. Pt has a hospital bed with rails, w/c, walker, BSC for home use.   Action/Plan: Pts daughter is very interested in Hospice and what services they can offer pt. Pts daughter stated that pt "does not have any quality of life" at this time. Palliative care consult obtained. Will continue to follow for discharge planning needs.  Expected Discharge Date:                  Expected Discharge Plan:  Hospice Medical Facility  In-House Referral:  Clinical Social Work, Hospice / Palliative Care  Discharge planning Services  CM Consult  Post Acute Care Choice:  Hospice Choice offered to:  Adult Children  DME Arranged:    DME Agency:     HH Arranged:    HH Agency:     Status of Service:  In process, will continue to follow  Medicare Important Message Given:    Date Medicare IM Given:    Medicare IM give by:    Date Additional Medicare IM Given:    Additional Medicare Important Message give by:     If discussed at Long Length of Stay Meetings, dates discussed:    Additional Comments:  Cheryl Flash, RN 08/10/2015, 10:56 AM

## 2015-08-10 NOTE — Clinical Documentation Improvement (Signed)
Internal Medicine  Can the diagnosis of pressure ulcer be further specified?   Document if pressure ulcer with stage is Present on Admission   Document Site with laterality - Elbow, Back (upper/lower), Sacral, Hip, Buttock, Ankle, Heel, Head, Other (Specify)  Pressure Ulcer Stage - Stage1, Stage 2, Stage 3, Stage 4, Unstageable, Unspecified, Unable to Clinically Determine  Other  Clinically Undetermined    Supporting Information:  H&P  "Pressure ulcer" Patient is bed bound with urinary incontinence.  Fracture of C1 vertebra, closed Treatment: Wound management, frequent rolling Please exercise your independent, professional judgment when responding. A specific answer is not anticipated or expected.   Thank You,  Andy Gauss Health Information Management [place] [phone]

## 2015-08-10 NOTE — Progress Notes (Signed)
[facility] PROGRESS NOTE  Andrea [name] ZOX:[id number] DOB: 1919-05-22 DOA: 08/07/2015 PCP: Wenda Low, MD    Code Status: DO NOT RESUSCITATE Family Communication: Discussed with patient's caretaker; discussed w/ daughter Andrea [name] on 07/30/15.  Disposition Plan: Discharge when clinically appropriate   Consultants:  Hospice pending  Procedures:  None  Antibiotics:  Ceftriaxone 9/16>>  HPI/Subjective: Patient has been able to take a few bites of dysphagia 2 diet. She is able to take her medications with when given/fed to her by her caretaker. Patient complains of a frontal headache.  Objective: Filed Vitals:   08/10/15 1304  BP: 118/64  Pulse: 57  Temp: 97.4 F (36.3 C)  Resp: 20    Oxygen saturation 92% on room air.  Intake/Output Summary (Last 24 hours) at 08/10/15 1336 Last data filed at 08/10/15 1100  Gross per 24 hour  Intake 1715.58 ml  Output      0 ml  Net 1715.58 ml   Filed Weights   08/07/15 1906  Weight: 40.143 kg (88 lb 8 oz)    Exam:   General:  Frail 81 year old Caucasian woman, lying in bed, in no acute distress.  Neck: Cervical collar is in place.  Cardiovascular: S1, S2, with a 2/6 systolic murmur.  Respiratory: Lungs clear anteriorly with decreased breath sounds in the bases.  Abdomen: Positive bowel sounds, soft, nontender, nondistended.  Musculoskeletal/extremities: Diffuse muscle atrophy; diffuse arthritic changes in her hands, feet, and knees with hypertrophic changes, but no acute hot red joints. No pedal edema.  Neurologic: The patient is sleeping, but becomes arousable to name. Her speech is clear and she appears to be answering appropriately.    Data Reviewed: Basic Metabolic Panel:  Recent Labs Lab 08/07/15 1248 08/08/15 0458 08/09/15 0601 08/10/15 0724  NA 144 143 141 138  K 4.3 3.8 3.2* 4.2  CL 106 111 109 110  CO2 GLUCOSE 104* 93 87 90  BUN 47* 36* 26* 17  CREATININE 1.36* 0.98 0.80  0.70  CALCIUM 9.3 8.7* 8.3* 8.4*   Liver Function Tests:  Recent Labs Lab 08/07/15 1248  AST 19  ALT 21  ALKPHOS 71  BILITOT 0.5  PROT 5.8*  ALBUMIN 3.4*   No results for input(s): LIPASE, AMYLASE in the last 168 hours. No results for input(s): AMMONIA in the last 168 hours. CBC:  Recent Labs Lab 08/07/15 1248 08/08/15 0458 08/09/15 0601  WBC 12.4* 10.9* 6.9  HGB 12.1 11.9* 10.7*  HCT 38.1 37.3 32.8*  MCV 97.7 96.1 95.1  PLT 248 238 239   Cardiac Enzymes: No results for input(s): CKTOTAL, CKMB, CKMBINDEX, TROPONINI in the last 168 hours. BNP (last 3 results) No results for input(s): BNP in the last 8760 hours.  ProBNP (last 3 results)  Recent Labs  09/12/14 0102 10/01/14 0938  PROBNP 9765.0* 1346.0*    CBG: No results for input(s): GLUCAP in the last 168 hours.  Recent Results (from the past 240 hour(s))  Urine culture     Status: None   Collection Time: 08/07/15  2:21 PM  Result Value Ref Range Status   Specimen Description URINE, CATHETERIZED  Final   Special Requests NONE  Final   Culture   Final    MULTIPLE SPECIES PRESENT, SUGGEST RECOLLECTION Performed at [facility]    Report Status 08/10/2015 FINAL  Final     Studies: No results found.  Scheduled Meds: . antiseptic oral rinse  7 mL Mouth Rinse BID  . buPROPion  300 mg Oral Daily  . cefTRIAXone (ROCEPHIN)  IV  1 g Intravenous Q24H  . clonazePAM  0.5 mg Oral BID  . cloNIDine  0.1 mg Transdermal Weekly  . enoxaparin (LOVENOX) injection  30 mg Subcutaneous Q24H  . escitalopram  10 mg Oral Daily  . metoprolol tartrate  25 mg Oral BID  . polyethylene glycol  17 g Oral QODAY  . QUEtiapine  25 mg Oral TID  . risperiDONE  0.25 mg Oral BID  . Umeclidinium-Vilanterol  1 puff Inhalation Q M,W,F   Continuous Infusions: . dextrose 5 % and 0.9 % NaCl with KCl 40 mEq/L 65 mL/hr at 08/09/15 1040    Assessment and Plan:  Principal Problem:   Urinary tract infection Active Problems:    Recurrent falls   Fracture of C1 vertebra, closed   Essential hypertension   Chronic systolic heart failure   Dysphagia   CKD (chronic kidney disease), stage III   Protein-calorie malnutrition, severe   Alzheimer's dementia with behavioral disturbance   Pressure ulcer    1. Urinary tract infection.  Urine culture was ordered. The patient was started on ceftriaxone and IV fluids. She has remained afebrile. Her white blood cell count which was 12.4 on admission, has normalized. Urine culture has grown out multiple species. We'll continue antibiotic therapy for total of 5-7 days.  Recurrent falls at home with resultant C1 fracture. Apparently, the ED physician discussed the patient with neurosurgery. They recommended a cervical collar. She has it in place and will be continued. -CT of her head revealed no acute intracranial findings, but with atrophy and small vessel disease.  Chronic systolic heart failure. Patient's 2-D echocardiogram October 2015 revealed an EF of 35-40%. Her chest x-ray on admission revealed no acute cardiopulmonary abnormalities. Given her low EF, her IV fluid rate was decreased. Her metoprolol was changed to IV because of dysphagia; will change it back to by mouth now that she is able to take some medications from the caretaker.  Hypertension. Patient's blood pressure is  moderately elevated, off of oral metoprolol. Catapres patch was added. Will restart oral metoprolol. Will also add when necessary hydralazine.  Stage II to stage III chronic kidney disease with mild acute on chronic renal injury. Patient's creatinine was 1.36 on admission. Following IV fluid hydration, it has normalized.  Hypokalemia.  Her serum potassium is following with IV fluids. Therefore, both dextrose and potassium chloride were added. Her serum potassium has improved.  Alzheimer's dementia with history of behavioral disturbance and depression with anxiety. The patient is treated with  multiple psychotropic medications including Wellbutrin, Lexapro, Seroquel, Risperdal, and clonazepam. -I wonder if her frequent falls were secondary to these medications. She is now practically nonambulatory. -She is on 2  anti-psychotic medications: Seroquel and Risperdal.. This was discussed with her daughter Mrs. [name] and her caretaker Andrea [name]. Apparently, Risperdal was recently added because the patient had continued agitation and anxiety at home. The addition of Risperdal seemed to have helped.  -Because the patient was made nothing by mouth due to apparent worsening dysphagia on 9/17, her anti-psychotic medications were withheld, but she was started on IV Haldol and IV Ativan in place of clonazepam. Although I explained this to Mrs. [name], both she and Andrea [name] preferred that the patient's oral psychotropic medications be restarted even though she is at risk of aspiration. I informed them that the cervical collar may be impeding her swallowing ability, but they felt that the patient was likely very anxious on 08/08/15 because  she had not received her psychotropic medications. Therefore, per their request and in the setting of known aspiration risk, her psychotropic medications were restarted on 9/16. The dose of Lexapro was decreased due to pharmacy recommendations. -She appears to be taking the medications better from the caretaker. -Speech therapy has been consulted for assessment.  Hospice referral On admission, apparently there was a request for hospice referral. Her daughter, Mrs. [name] stated that the patient had a hospice assessment visitation  at home a few weeks ago, but it was not clear if they would be of help.   palates of care/hospice will be consulted while the patient is an inpatient.  Chronic dysphagia. The patient was made nothing by mouth on 9/17 with the nursing staff reported choking/gagging episodes on applesauce and medications. At home, she is chronically treated with thickened  liquids. Per conversation with her daughter and caretaker on 9/18, the dysphagia 2 with nectar thickened liquid diet  restwasarted with known aspiration risk. Per her caretaker, the patient has anxiousness when other individuals that she does not know try to feed her.  I informed them that the cervical collar may be interfering with her swallowing ability and could be taken off during times of eating. Nursing was instructed to take off the cervical collar when the patient attempts to eat and then place it back on afterwards. Speech therapy consult/evaluation has been ordered.    Time spent:     [facility]  [facility] Pager [phone]. If 7PM-7AM, please contact night-coverage at [email or web address], password [facility] 08/10/2015, 1:36 PM  LOS: 3 days

## 2015-08-11 DIAGNOSIS — Z515 Encounter for palliative care: Secondary | ICD-10-CM | POA: Insufficient documentation

## 2015-08-11 DIAGNOSIS — S12000A Unspecified displaced fracture of first cervical vertebra, initial encounter for closed fracture: Secondary | ICD-10-CM | POA: Insufficient documentation

## 2015-08-11 DIAGNOSIS — Z7189 Other specified counseling: Secondary | ICD-10-CM

## 2015-08-11 LAB — CBC
HCT: 42 % (ref 36.0–46.0)
Hemoglobin: 13.5 g/dL (ref 12.0–15.0)
MCH: 30.8 pg (ref 26.0–34.0)
MCHC: 32.1 g/dL (ref 30.0–36.0)
MCV: 95.7 fL (ref 78.0–100.0)
PLATELETS: 270 10*3/uL (ref 150–400)
RBC: 4.39 MIL/uL (ref 3.87–5.11)
RDW: 13.2 % (ref 11.5–15.5)
WBC: 8.1 10*3/uL (ref 4.0–10.5)

## 2015-08-11 MED ORDER — LORAZEPAM 0.5 MG PO TABS: 0.5000 mg | ORAL_TABLET | ORAL | Status: DC | PRN

## 2015-08-11 MED ORDER — LORAZEPAM 0.5 MG PO TABS: 0.5000 mg | ORAL_TABLET | ORAL | Status: AC | PRN

## 2015-08-11 NOTE — Consult Note (Signed)
Consultation Note Date: 08/11/2015   Patient Name: Andrea [name]  DOB: January 30, 1919  MRN: [id number]  Age / Sex: 81 y.o., female   PCP: Andrea Collin, MD Referring Physician: Elliot Cousin, MD  Reason for Consultation: Establishing goals of care and Psychosocial/spiritual support  Palliative Care Assessment and Plan Summary of Established Goals of Care and Medical Treatment Preferences   Clinical Assessment/Narrative: Andrea [name] is resting quietly in bed today with her caregiver at her side.  She is pleasant but demented.  Call to her daughter Andrea [name].  Andrea [name] tells me that her mother is "in a bad place", and "basically her life is ...".   Andrea [name] tells me about her struggles with managing her mothers health/dementia and her struggles to find caregivers.  Andrea [name] states the aids feel, "lets save her, and keep her alive".  She goes on to tell me that the aids "have their hands on her; I've lost control of her".   Andrea [name] tells me about Andrea [name]'s outbursts, how she feels they are getting "lots worse" and that she cant find placement at a facility d/t her behaviors.  Andrea [name] goes on to tell me about her visits from a palliative nurse from [place] and how she has not gotten as much help as she wanted.   Andrea [name] tells me, "I know my mother, she doesn't want to die in a nursing home".  We talk about AD and Andrea [name] gets her copy of AD completed in 1991 and revised in 2000.  As she reviews she states "no antibiotics ... Huh.Marland Kitchen Marland KitchenI'm doing that to her now".  We talk about the concept of "allow a natural death".  Andrea [name] states, "I didn't know there was a choice".  She tells me she had considered this and that her husband had legal concerns.   We talk about the MOST form, do no re hospitalize, do not treat the next infection.  She asks why the palliative nurse from [facility] didn't let her know what to expect and that she had these choices.  Andrea [name] goes on to tell me that she has seen a "big change" in her mother over the  last month, again stating "I didnt't know this was possible" (allow a natural death).   We discuss stopping IV fluids, antibiotics, and any other medications that are not for comfort, and transitioning to comfort care here in the hospital.  I offer options of taking Andrea [name] home with Hospice vs in patient Hospice.  Andrea [name] wants to talk with her husband and make a decision. Andrea [name] talks about the financial, emotional, and physical costs of prolonging her mothers life.   Return call from [employer]. She tells me that she has talked with her husband and son and they agree with placement in Hospice home if possible.  Andrea [name] agrees to comfort care with D/c of iv fluids and antibiotics.  SW, nursing and Dr. Sherrie Mustache notified.   Contacts/Participants in Discussion: Primary Decision Maker: Daughter, Andrea [name] is primary decision maker as Andrea [name] has dementia.  HCPOA: yes  Andrea [name] has both durable and HCPOA.  Code Status/Advance Care Planning:  DNR  Andrea [name] shares AD completed in 1991 and revised in 2000.   Tube feeding: No  Antibiotics: No  Symptom Management:   Tylenol 650 mg PO/PR Q 6 hours PRN, Morphine 0.5-1 mg IV Q 2 hour PRN, Zofran 4 mg PO/IV Q 6 hours PRN.   Palliative Prophylaxis: Miralax 17 G PO QOD.   Psycho-social/Spiritual:  Support System: Andrea [name] lives in her own home with caregivers 24/7.   Desire for further Chaplaincy support:  No, not at this time.   Prognosis: < 2 weeks, if no longer supported by IV fluids and antibiotics.   Discharge Planning:  Hospice facility, if possible.        Chief Complaint:  Fatigue History of Present Illness:   Andrea [name] is a 81 y.o. female with Alzheimer's disease with behavioral disturbance, COPD, hypertension, CHF, recent hip fracture. Due to the degree of dementia, the patient is unreliable and the history is given by her caregiver. Patient is bed bound with urinary incontinence. She reportedly had a fall 6 days  ago, although this was not communicated with the ED physician when she was brought here. She has had increasing neck pain over the past 5 days, worse with movement. The caregiver notes that she has had increasing somnolence during the day over the past several days which is worsened.  Her hospital course has been stable, and her neck pain is controlled.   Primary Diagnoses  Present on Admission:  . Alzheimer's dementia with behavioral disturbance . Urinary tract infection . Fracture of C1 vertebra, closed . Essential hypertension . CKD (chronic kidney disease), stage III . Protein-calorie malnutrition, severe . Chronic systolic heart failure . Pressure ulcer  Palliative Review of Systems: [name] has complaints of pain at her neck.  This is controlled with IV Morphine. No co NV, Anxiety or dyspnea.  I have reviewed the medical record, interviewed the patient and family, and examined the patient. The following aspects are pertinent.  Past Medical History  Diagnosis Date  . COPD (chronic obstructive pulmonary disease)   . Alzheimer's dementia   . Depression   . Chronic kidney disease   . Hypertension   . Dementia with behavioral disturbance 08/26/2014  . Acute respiratory failure 09/12/2014  . Pneumonia 09/12/2014  . L2 vertebral fracture 08/25/2014  . Hip fracture 03/03/2015  . Flash pulmonary edema 09/14/2014  . Orthostatic hypotension 08/26/2014  . Right hip pain 03/03/2015   Social History   Social History  . Marital Status: Widowed    Spouse Name: N/A  . Number of Children: N/A  . Years of Education: N/A   Social History Main Topics  . Smoking status: Never Smoker   . Smokeless tobacco: None  . Alcohol Use: No  . Drug Use: No  . Sexual Activity: Not Asked   Other Topics Concern  . None   Social History Narrative   Family History  Problem Relation Age of Onset  . Suicidality Other    Scheduled Meds: . antiseptic oral rinse  7 mL Mouth Rinse BID  .  buPROPion  300 mg Oral Daily  . cefTRIAXone (ROCEPHIN)  IV  1 g Intravenous Q24H  . clonazePAM  0.5 mg Oral BID  . cloNIDine  0.1 mg Transdermal Weekly  . enoxaparin (LOVENOX) injection  30 mg Subcutaneous Q24H  . escitalopram  10 mg Oral Daily  . Influenza vac split quadrivalent PF  0.5 mL Intramuscular Tomorrow-1000  . metoprolol tartrate  25 mg Oral BID  . polyethylene glycol  17 g Oral QODAY  . QUEtiapine  25 mg Oral TID  . risperiDONE  0.25 mg Oral BID  . Umeclidinium-Vilanterol  1 puff Inhalation Q M,W,F   Continuous Infusions: . dextrose 5 % and 0.9 % NaCl with KCl 40 mEq/L 50 mL/hr at 08/10/15 1717   PRN Meds:.acetaminophen **OR** acetaminophen, haloperidol lactate, hydrALAZINE,  morphine injection, ondansetron **OR** ondansetron (ZOFRAN) IV Medications Prior to Admission:  Prior to Admission medications   Medication Sig Start Date End Date Taking? Authorizing Stanford Strauch  ANORO ELLIPTA 62.5-25 MCG/INH AEPB Inhale 1 puff into the lungs 4 (four) times a week. Take on Monday, Wednesday, Friday, and Sunday. 01/28/15  Yes Historical Clarance Bollard, MD  buPROPion (WELLBUTRIN XL) 300 MG 24 hr tablet Take 300 mg by mouth every morning. 02/16/15  Yes Historical Rhone Ozaki, MD  Cholecalciferol (VITAMIN D PO) Take 1 tablet by mouth daily.   Yes Historical Linzi Ohlinger, MD  clonazePAM (KLONOPIN) 0.25 MG disintegrating tablet Take 0.25 mg by mouth daily as needed (breakthrough anxiety).   Yes Historical Ratasha Fabre, MD  clonazePAM (KLONOPIN) 0.5 MG tablet Take 1 tablet (0.5 mg total) by mouth 3 (three) times daily. 07/23/15  Yes Leighton Roach McDiarmid, MD  escitalopram (LEXAPRO) 20 MG tablet Take 1 tablet (20 mg total) by mouth daily. 08/28/14  Yes Ripudeep Jenna Luo, MD  feeding supplement, ENSURE, (ENSURE) PUDG Take 1 Container by mouth as needed. 07/23/15  Yes Leighton Roach McDiarmid, MD  ibuprofen (IBUPROFEN) 100 MG/5ML suspension Take 200 mg by mouth daily as needed for moderate pain.   Yes Historical Johnryan Sao, MD  IRON PO Take 1  tablet by mouth 3 (three) times a week. Take on Monday, Wednesday, and Friday.   Yes Historical Akito Boomhower, MD  Melatonin 10 MG CAPS Take 1 tablet by mouth at bedtime.   Yes Historical Shyra Emile, MD  metoprolol tartrate (LOPRESSOR) 25 MG tablet Take 0.5 tablets (12.5 mg total) by mouth 2 (two) times daily. 03/06/15  Yes Richarda Overlie, MD  polyethylene glycol (MIRALAX / GLYCOLAX) packet Take 17 g by mouth daily as needed for mild constipation.   Yes Historical Aayan Haskew, MD  QUEtiapine (SEROQUEL) 25 MG tablet Take 25 mg by mouth 3 (three) times daily.  07/23/15  Yes Leighton Roach McDiarmid, MD  risperiDONE (RISPERDAL) 0.25 MG tablet Take 1 tablet (0.25 mg total) by mouth 2 (two) times daily. 07/31/15  Yes Andrea Collin, MD   Allergies  Allergen Reactions  . Aspirin Other (See Comments)    Stomach upset   CBC:    Component Value Date/Time   WBC 8.1 08/11/2015 0650   HGB 13.5 08/11/2015 0650   HCT 42.0 08/11/2015 0650   PLT 270 08/11/2015 0650   MCV 95.7 08/11/2015 0650   NEUTROABS 11.0* 03/03/2015 1400   LYMPHSABS 4.4* 03/03/2015 1400   MONOABS 1.0 03/03/2015 1400   EOSABS 0.0 03/03/2015 1400   BASOSABS 0.0 03/03/2015 1400   Comprehensive Metabolic Panel:    Component Value Date/Time   NA 138 08/10/2015 0724   K 4.2 08/10/2015 0724   CL 110 08/10/2015 0724   CO2 24 08/10/2015 0724   BUN 17 08/10/2015 0724   CREATININE 0.70 08/10/2015 0724   CREATININE 1.40* 02/26/2015 1658   GLUCOSE 90 08/10/2015 0724   CALCIUM 8.4* 08/10/2015 0724   AST 19 08/07/2015 1248   ALT 21 08/07/2015 1248   ALKPHOS 71 08/07/2015 1248   BILITOT 0.5 08/07/2015 1248   PROT 5.8* 08/07/2015 1248   ALBUMIN 3.4* 08/07/2015 1248    Physical Exam: Vital Signs: BP 163/75 mmHg  Pulse 50  Temp(Src) 97.5 F (36.4 C) (Oral)  Resp 18  Ht  (1.676 m)  Wt 40.143 kg (88 lb 8 oz)  BMI 14.29 kg/m2  SpO2 98% SpO2: SpO2: 98 % O2 Device: O2 Device: Nasal Cannula O2 Flow Rate: O2 Flow Rate (L/min): 2 L/min  Intake/output  summary:  Intake/Output Summary (Last 24 hours) at 08/11/15 1034 Last data filed at 08/11/15 0548  Gross per 24 hour  Intake 1600.33 ml  Output      0 ml  Net 1600.33 ml   LBM: Last BM Date:  (pt and sitter unable to answer this question) Baseline Weight: Weight: 40.143 kg (88 lb 8 oz) Most recent weight: Weight: 40.143 kg (88 lb 8 oz)  Exam Findings:  Constitutional:  Elderly frail, lying in bed,  Makes but doesn't keep eye contact.  Resp:  Even and non labored.  GI: Abd soft non tender.  Musc/skel:  Cervical collar in place.                         Palliative Performance Scale:  3-4 months ago: 50% [place]:  20-30% at best.   Additional Data Reviewed: Recent Labs     08/09/15  0601  08/10/15  0724  08/11/15  0650  WBC  6.9   --   8.1  HGB  10.7*   --   13.5  PLT  239   --   270  NA  141  138   --   BUN  26*  17   --   CREATININE  0.80  0.70   --      Time In: 0900 Time Out: 1030 Time Total:  90 minutes  Greater than 50%  of this time was spent counseling and coordinating care related to the above assessment and plan.  Signed by: Katheran Awe, NP  Katheran Awe, NP  08/11/2015, 10:34 AM  Please contact Palliative Medicine Team phone at [phone] for questions and concerns.

## 2015-08-11 NOTE — Care Management Important Message (Signed)
Important Message  Patient Details  Name: Matha Masse MRN: [id number] Date of Birth: 1919-05-21   Medicare Important Message Given:  Yes-second notification given    Cheryl Flash, RN 08/11/2015, 2:47 PM

## 2015-08-11 NOTE — Clinical Social Work Note (Addendum)
Pt to transfer to [facility] this afternoon. [name], [name] has already signed paperwork there. D/C summary faxed. Pt to transfer via [facility]- to be arranged by Building control surveyor when pt ready.   Derenda Fennel, LCSW [phone]

## 2015-08-11 NOTE — Discharge Summary (Addendum)
Physician Discharge Summary  Deem Marmol ZOX:[id number] DOB: Aug 24, 1919 DOA: 08/07/2015  PCP: Wenda Low, MD  Admit date: 08/07/2015 Discharge date: 08/11/2015  Time spent: Greater than 30 minutes  Recommendations for Outpatient Follow-up:  1. The patient is being discharged to [facility]    Discharge Diagnoses:  1. Progressive failure to thrive, Hospice appropriate. Patient is being transitioned to comfort care. --DO NOT RESUSCITATE status. 2. Recurrent falls at home with resultant C1 fracture. 3. Urinary tract infection. 4. Chronic systolic heart failure. 5. Hypertension. 6. Dysphagia. 7. Stage III chronic kidney disease. 8. Alzheimer's dementia with a history of behavioral disturbance. 9. Chronic depression with anxiety. 10. Stage I sacral pressure ulcer. 11. COPD. 12. Acute respiratory failure with hypoxia, likely secondary to underlying COPD. 13. Stage I sacral/buttock ulcer.   Discharge Condition: Stable  Diet recommendation: Dysphagia 2 with thickened or thin liquids as tolerated.  Filed Weights   08/07/15 1906  Weight: 40.143 kg (88 lb 8 oz)    History of present illness:  The patient is a 81 year old woman with a history of advanced Alzheimer's dementia with associated behavioral disturbance including severe anxiousness, depression, COPD, chronic systolic heart failure, recent hip fracture, chronic kidney disease, who presented to the emergency department on 08/07/2015 for a complaint of neck pain. Reportedly, she had increasing somnolence. The history was provided by her caretakers. In the ED, she was afebrile, mildly tachycardic, hypertensive, with oxygen saturation ranging from 85-90%. Her urinalysis was consistent with infection. CT of her head revealed atrophy and small vessel disease, but no evidence of intracranial abnormalities. CT of her cervical spine revealed an acute fracture left anterior and left posterior arch of the C1 ring  associated with lateral subluxation. She was admitted for further evaluation and management.  Of note, the patient's daughter [name] Read requested a hospice consult during hospitalization.    Hospital Course:  During the patient's stay in the ED, the ED physician discussed the C1 fracture with neurosurgery at [facility]. Neurosurgery recommended a cervical collar which was placed. The patient was also started on ceftriaxone for treatment of what was suspected to be a urinary tract infection. She was also started on gentle IV fluids;, but with caution given her history of chronic systolic heart failure with an EF of 35-40%. Oxygen was applied and titrated to comfort and to keep her on saturation saturations greater than 90%. She was restarted on metoprolol, but the route was changed to IV when there was concern about worsening dysphasia. Catapres patch was also added as the patient's blood pressure was trending upward. The patient was initially started on a dysphagia 2 with nectar thickened liquids, but it was discontinued when nursing reported that the patient had been gagging with her diet and with taking oral medications. However, after discussing her apparent worsening dysphagia with her daughter Mrs. Read and the caretakers, they felt that she would be able to take her diet again particularly when she is fed by caretakers she recognizes. I informed them that the cervical collar could have been impeding her ability to swallow, so the nursing staff was instructed to take it off when the patient attempted to eat or take her medications.  Patient's chronic psychotropic medications including Wellbutrin, Seroquel, Risperdal, and clonazepam were continued. The dose of Lexapro was decreased per pharmacy recommendation. At times, the patient did become anxious, but this was thought to be secondary to temporarily being off of her oral medications while she was nothing by mouth.  Mrs.  Read requested a hospice consult  as the patient had been deteriorating over the past few weeks. Palliative care consult ensued. Palliative care NP, Ms. Dove discussed goals of care with Mrs. Read. The patient had already been a DO NOT RESUSCITATE prior to admission. The patient was hospice appropriate. Following the conversation between palliative care and Mrs. Read, the decision was made to transition the patient to comfort care and to hospice. Therefore, the patient was discharged to [facility].  All of her medications that were not conducive to comfort were discontinued. She was continued on her inhaler and anti-psychotics. Ativan when necessary was ordered upon discharge. The patient will receive opiate analgesics at the hospice facility, per their protocol.    Procedures:  None  Consultations:  Palliative care  Discharge Exam: Filed Vitals:   08/11/15 1447  BP: 170/71  Pulse: 58  Temp: 97.8 F (36.6 C)  Resp:    oxygen saturation 92% on nasal cannula oxygen.  General: Frail 81 year old Caucasian woman sitting up in bed. She is much more alert.  Neck: Cervical collar is in place.  Cardiovascular: S1, S2, with a 2/6 systolic murmur.  Respiratory: Lungs clear anteriorly with decreased breath sounds in the bases.  Abdomen: Positive bowel sounds, soft, nontender, nondistended.  Musculoskeletal/extremities: No pedal edema.  Discharge Instructions   Discharge Instructions    Discharge instructions    Complete by:  As directed   Diet: dysphagia 2 with thickened liquids as tolerated.     Increase activity slowly    Complete by:  As directed           Current Discharge Medication List    START taking these medications   Details  LORazepam (ATIVAN) 0.5 MG tablet Take 1 tablet (0.5 mg total) by mouth every 4 (four) hours as needed for anxiety. Qty: 30 tablet, Refills: 0      CONTINUE these medications which have NOT CHANGED   Details  ANORO ELLIPTA 62.5-25 MCG/INH AEPB  Inhale 1 puff into the lungs 4 (four) times a week. Take on Monday, Wednesday, Friday, and Sunday. Refills: 12    QUEtiapine (SEROQUEL) 25 MG tablet Take 25 mg by mouth 3 (three) times daily.  Qty: 90 tablet, Refills: 0    risperiDONE (RISPERDAL) 0.25 MG tablet Take 1 tablet (0.25 mg total) by mouth 2 (two) times daily. Qty: 120 tablet, Refills: 0   Associated Diagnoses: Dementia with behavioral disturbance      STOP taking these medications     buPROPion (WELLBUTRIN XL) 300 MG 24 hr tablet      Cholecalciferol (VITAMIN D PO)      clonazePAM (KLONOPIN) 0.25 MG disintegrating tablet      clonazePAM (KLONOPIN) 0.5 MG tablet      escitalopram (LEXAPRO) 20 MG tablet      feeding supplement, ENSURE, (ENSURE) PUDG      ibuprofen (IBUPROFEN) 100 MG/5ML suspension      IRON PO      Melatonin 10 MG CAPS      metoprolol tartrate (LOPRESSOR) 25 MG tablet      polyethylene glycol (MIRALAX / GLYCOLAX) packet        Allergies  Allergen Reactions  . Aspirin Other (See Comments)    Stomach upset      The results of significant diagnostics from this hospitalization (including imaging, microbiology, ancillary and laboratory) are listed below for reference.    Significant Diagnostic Studies: Ct Head Wo Contrast  08/07/2015   CLINICAL DATA:  Old large bruise on the right forehead. Multiple bruises on lower extremities. Altered mental status. Not responsive.  EXAM: CT HEAD WITHOUT CONTRAST  CT CERVICAL SPINE WITHOUT CONTRAST  TECHNIQUE: Multidetector CT imaging of the head and cervical spine was performed following the standard protocol without intravenous contrast. Multiplanar CT image reconstructions of the cervical spine were also generated.  COMPARISON:  03/03/2015  FINDINGS: CT HEAD FINDINGS  There is significant central and cortical atrophy. Periventricular white matter changes are consistent with small vessel disease. There is no intra or extra-axial fluid collection or mass  lesion. The basilar cisterns and ventricles have a normal appearance. There is no CT evidence for acute infarction or hemorrhage. Bone windows show right frontal scalp edema without underlying calvarial fracture. Small amount of fluid is identified within the sphenoid air cells. No acute calvarial fracture identified. There is dense atherosclerotic calcification of the internal carotid arteries appear  CT CERVICAL SPINE FINDINGS  There is an acute fracture involving the left anterior and left posterior arch of the C1 ring.  The left lateral mass is laterally subluxed as a result. There is a fracture fragment in the C1-2 foramina. There is no significant anterior-posterior subluxation. No other acute fractures are identified. There is significant disc height loss at numerous levels, including C4-5, C5-6, C6-7. There is anterolisthesis of C5 on C6 by 2 mm which is degenerative. Lung apices are unremarkable. The visualized portion of the thyroid gland has a normal appearance.  IMPRESSION: 1. Atrophy and small vessel disease. 2.  No evidence for acute intracranial abnormality. 3. Right frontal scalp edema without acute calvarial fracture. 4. Acute fracture left anterior and left posterior arch of the C1 ring associated with lateral subluxation of the left lateral mass. 5. Degenerative changes in the cervical spine.  Critical Value/emergent results were called by telephone at the time of interpretation on 08/07/2015 at 3:51 pm to Dr. Cathren Laine , who verbally acknowledged these results.   Electronically Signed   By: Norva Pavlov M.D.   On: 08/07/2015 15:51   Ct Cervical Spine Wo Contrast  08/07/2015   CLINICAL DATA:  Old large bruise on the right forehead. Multiple bruises on lower extremities. Altered mental status. Not responsive.  EXAM: CT HEAD WITHOUT CONTRAST  CT CERVICAL SPINE WITHOUT CONTRAST  TECHNIQUE: Multidetector CT imaging of the head and cervical spine was performed following the standard protocol  without intravenous contrast. Multiplanar CT image reconstructions of the cervical spine were also generated.  COMPARISON:  03/03/2015  FINDINGS: CT HEAD FINDINGS  There is significant central and cortical atrophy. Periventricular white matter changes are consistent with small vessel disease. There is no intra or extra-axial fluid collection or mass lesion. The basilar cisterns and ventricles have a normal appearance. There is no CT evidence for acute infarction or hemorrhage. Bone windows show right frontal scalp edema without underlying calvarial fracture. Small amount of fluid is identified within the sphenoid air cells. No acute calvarial fracture identified. There is dense atherosclerotic calcification of the internal carotid arteries appear  CT CERVICAL SPINE FINDINGS  There is an acute fracture involving the left anterior and left posterior arch of the C1 ring.  The left lateral mass is laterally subluxed as a result. There is a fracture fragment in the C1-2 foramina. There is no significant anterior-posterior subluxation. No other acute fractures are identified. There is significant disc height loss at numerous levels, including C4-5, C5-6, C6-7. There is anterolisthesis of C5 on C6 by 2 mm which is  degenerative. Lung apices are unremarkable. The visualized portion of the thyroid gland has a normal appearance.  IMPRESSION: 1. Atrophy and small vessel disease. 2.  No evidence for acute intracranial abnormality. 3. Right frontal scalp edema without acute calvarial fracture. 4. Acute fracture left anterior and left posterior arch of the C1 ring associated with lateral subluxation of the left lateral mass. 5. Degenerative changes in the cervical spine.  Critical Value/emergent results were called by telephone at the time of interpretation on 08/07/2015 at 3:51 pm to Dr. Cathren Laine , who verbally acknowledged these results.   Electronically Signed   By: Norva Pavlov M.D.   On: 08/07/2015 15:51   Dg Chest  Port 1 View  08/07/2015   CLINICAL DATA:  Cough.  EXAM: PORTABLE CHEST - 1 VIEW  COMPARISON:  March 06, 2015.  FINDINGS: Stable cardiomegaly. No pneumothorax or pleural effusion is noted. Old left rib fractures are noted. Mild degenerative change of right glenohumeral joint is noted. No acute pulmonary disease is noted.  IMPRESSION: No acute cardiopulmonary abnormality seen.   Electronically Signed   By: Lupita Raider, M.D.   On: 08/07/2015 13:12    Microbiology: Recent Results (from the past 240 hour(s))  Urine culture     Status: None   Collection Time: 08/07/15  2:21 PM  Result Value Ref Range Status   Specimen Description URINE, CATHETERIZED  Final   Special Requests NONE  Final   Culture   Final    MULTIPLE SPECIES PRESENT, SUGGEST RECOLLECTION Performed at [facility]    Report Status 08/10/2015 FINAL  Final     Labs: Basic Metabolic Panel:  Recent Labs Lab 08/07/15 1248 08/08/15 0458 08/09/15 0601 08/10/15 0724  NA 144 143 141 138  K 4.3 3.8 3.2* 4.2  CL 106 111 109 110  CO2 31 26 26 24   GLUCOSE 104* 93 87 90  BUN 47* 36* 26* 17  CREATININE 1.36* 0.98 0.80 0.70  CALCIUM 9.3 8.7* 8.3* 8.4*   Liver Function Tests:  Recent Labs Lab 08/07/15 1248  AST 19  ALT 21  ALKPHOS 71  BILITOT 0.5  PROT 5.8*  ALBUMIN 3.4*   No results for input(s): LIPASE, AMYLASE in the last 168 hours. No results for input(s): AMMONIA in the last 168 hours. CBC:  Recent Labs Lab 08/07/15 1248 08/08/15 0458 08/09/15 0601 08/11/15 0650  WBC 12.4* 10.9* 6.9 8.1  HGB 12.1 11.9* 10.7* 13.5  HCT 38.1 37.3 32.8* 42.0  MCV 97.7 96.1 95.1 95.7  PLT 248 238 239 270   Cardiac Enzymes: No results for input(s): CKTOTAL, CKMB, CKMBINDEX, TROPONINI in the last 168 hours. BNP: BNP (last 3 results) No results for input(s): BNP in the last 8760 hours.  ProBNP (last 3 results)  Recent Labs  09/12/14 0102 10/01/14 0938  PROBNP 9765.0* 1346.0*    CBG: No results for  input(s): GLUCAP in the last 168 hours.     Signed:  FISHER,DENISE  [facility] 08/11/2015, 3:28 PM

## 2015-08-11 NOTE — Care Management Note (Signed)
Case Management Note  Patient Details  Name: Christinea Brizuela MRN: [id number] Date of Birth: 06-11-1919  Subjective/Objective:                    Action/Plan:   Expected Discharge Date:                  Expected Discharge Plan:  Hospice Medical Facility  In-House Referral:  Clinical Social Work, Hospice / Palliative Care  Discharge planning Services  CM Consult  Post Acute Care Choice:  Hospice Choice offered to:  Adult Children  DME Arranged:    DME Agency:     HH Arranged:    HH Agency:     Status of Service:  Completed, signed off  Medicare Important Message Given:  Yes-second notification given Date Medicare IM Given:    Medicare IM give by:    Date Additional Medicare IM Given:    Additional Medicare Important Message give by:     If discussed at Long Length of Stay Meetings, dates discussed:    Additional Comments: Pt made comfort care. Pt to discharge to [facility] today. CSW to arrange discharge to facility. Arlyss [place], RN 08/11/2015, 2:47 PM

## 2015-08-11 NOTE — Progress Notes (Signed)
Discharged to [facility], condition stable, IV NSL to LAFA intact, out via stretcher with [facility].

## 2015-08-11 NOTE — Clinical Social Work Note (Signed)
Clinical Social Work Assessment  Patient Details  Name: Andrea [name] MRN: [id number] Date of Birth: 1919-10-21  Date of referral:  08/11/15               Reason for consult:  End of Life/Hospice                Permission sought to share information with:    Permission granted to share information::     Name::        Agency::     Relationship::     Contact Information:     Housing/Transportation Living arrangements for the past 2 months:  Single Family Home Source of Information:  Adult Children Patient Interpreter Needed:  None Criminal Activity/Legal Involvement Pertinent to Current Situation/Hospitalization:  No - Comment as needed Significant Relationships:  Adult Children Lives with:  Other (Comment) (around the clock sitters) Do you feel safe going back to the place where you live?   (daughter requesting residential hospice) Need for family participation in patient care:  Yes (Comment)  Care giving concerns:  Pt has around the clock sitters.    Social Worker assessment / plan:  CSW spoke with pt's daughter/HCPOA, Andrea [name] on phone. Pt has advanced dementia. Andrea [name] reports that pt has been living at home with around the clock sitters. She indicates that pt has had a significant decline in the past two months and has had poor quality of life. Andrea [name] states, "She is not in a good place." Andrea [name] shares that pt has had a lot of emotional outbursts recently and other behaviors related to dementia. Andrea [name] appeared to be somewhat conflicted on direction of care, but reports that reviewing pt's living will gave her clarity. After discussing with palliative care, Andrea [name] requests residential hospice consult for [facility] in [place]. CSW made referral and bed is available. Hospice will follow up with CSW after talking with Andrea [name] further.   Employment status:  Retired Health and safety inspector:  Medicare PT Recommendations:  Not assessed at this time Information / Referral to community resources:   Other (Comment Required) [facility] Michell Heinrich)  Patient/Family's Response to care:  [name] requests residential hospice consult.   Patient/Family's Understanding of and Emotional Response to Diagnosis, Current Treatment, and Prognosis:  [name] appears to have understanding of pt's condition and is aware of poor prognosis. She states that her mother has had a very poor quality of life recently. Support provided.   Emotional Assessment Appearance:  Appears stated age Attitude/Demeanor/Rapport:  Unable to Assess Affect (typically observed):  Unable to Assess Orientation:    Alcohol / Substance use:  Not Applicable Psych involvement (Current and /or in the community):  No (Comment)  Discharge Needs  Concerns to be addressed:  Discharge Planning Concerns, Coping/Stress Concerns Readmission within the last 30 days:  No Current discharge risk:  Terminally ill Barriers to Discharge:  No Barriers Identified   Andrea Cassis, LCSW 08/11/2015, 12:37 PM [phone]

## 2015-08-22 DEATH — deceased

## 2015-12-01 ENCOUNTER — Telehealth: Payer: Self-pay | Admitting: *Deleted

## 2015-12-01 NOTE — Telephone Encounter (Addendum)
Called to offer flu vaccine. Spoke with patient's daughter, Andrea [name], who stated that her mother passed away on January 19, 2015. Daughter stated she was very thankful for the wonderful care her mother received through [facility]. Note routed to PCP, Dr. Gayla DossJoyner. Fredderick SeveranceUCATTE, Collyns Mcquigg L, RN

## 2016-07-28 IMAGING — CR DG CHEST 2V
2 series · 2 of 2 positions shown · non-contrast
Comparison: 06/17/2013.

CLINICAL DATA: Left chest pain following a fall today.

EXAM:
CHEST  2 VIEW

[t chest supine]
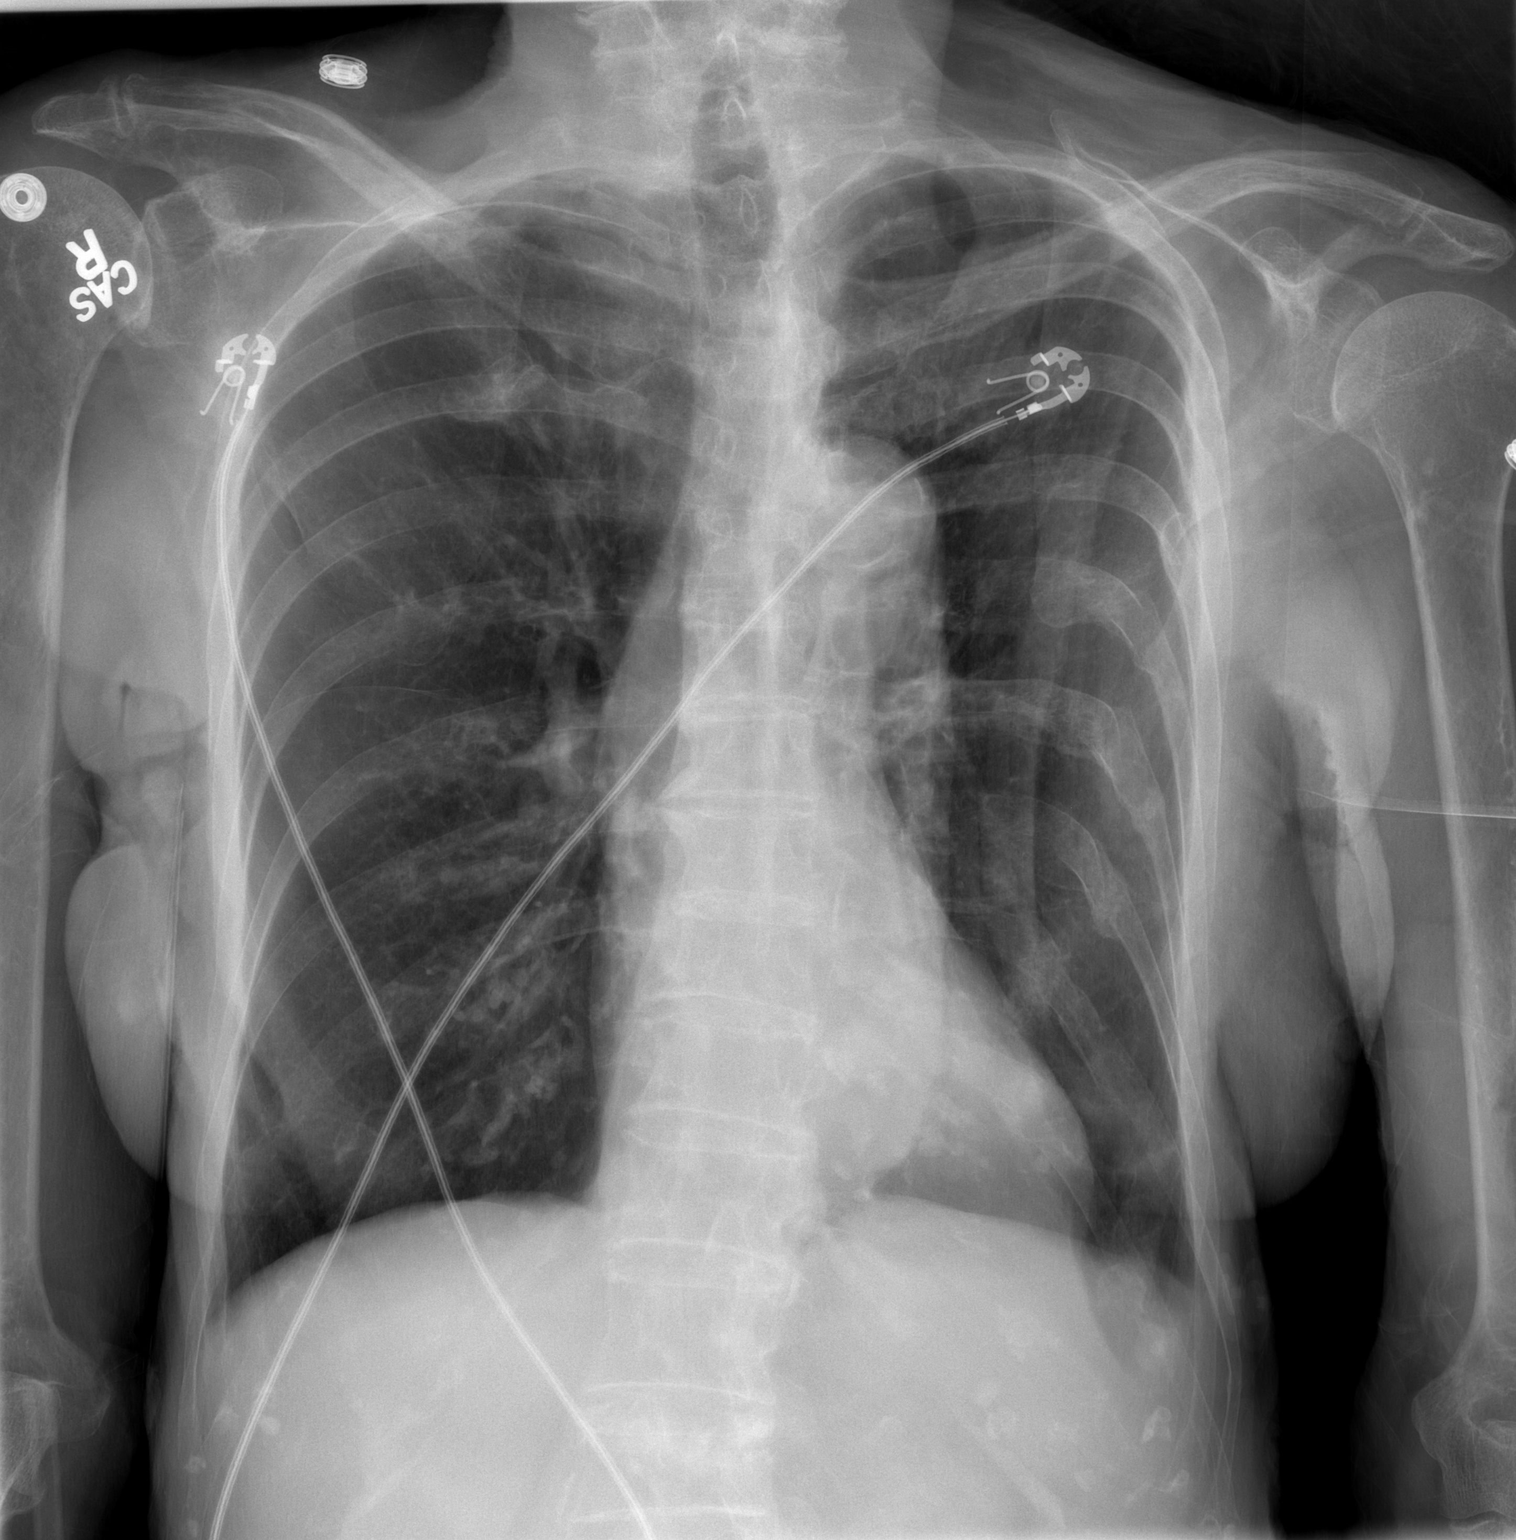

[w chest lat]
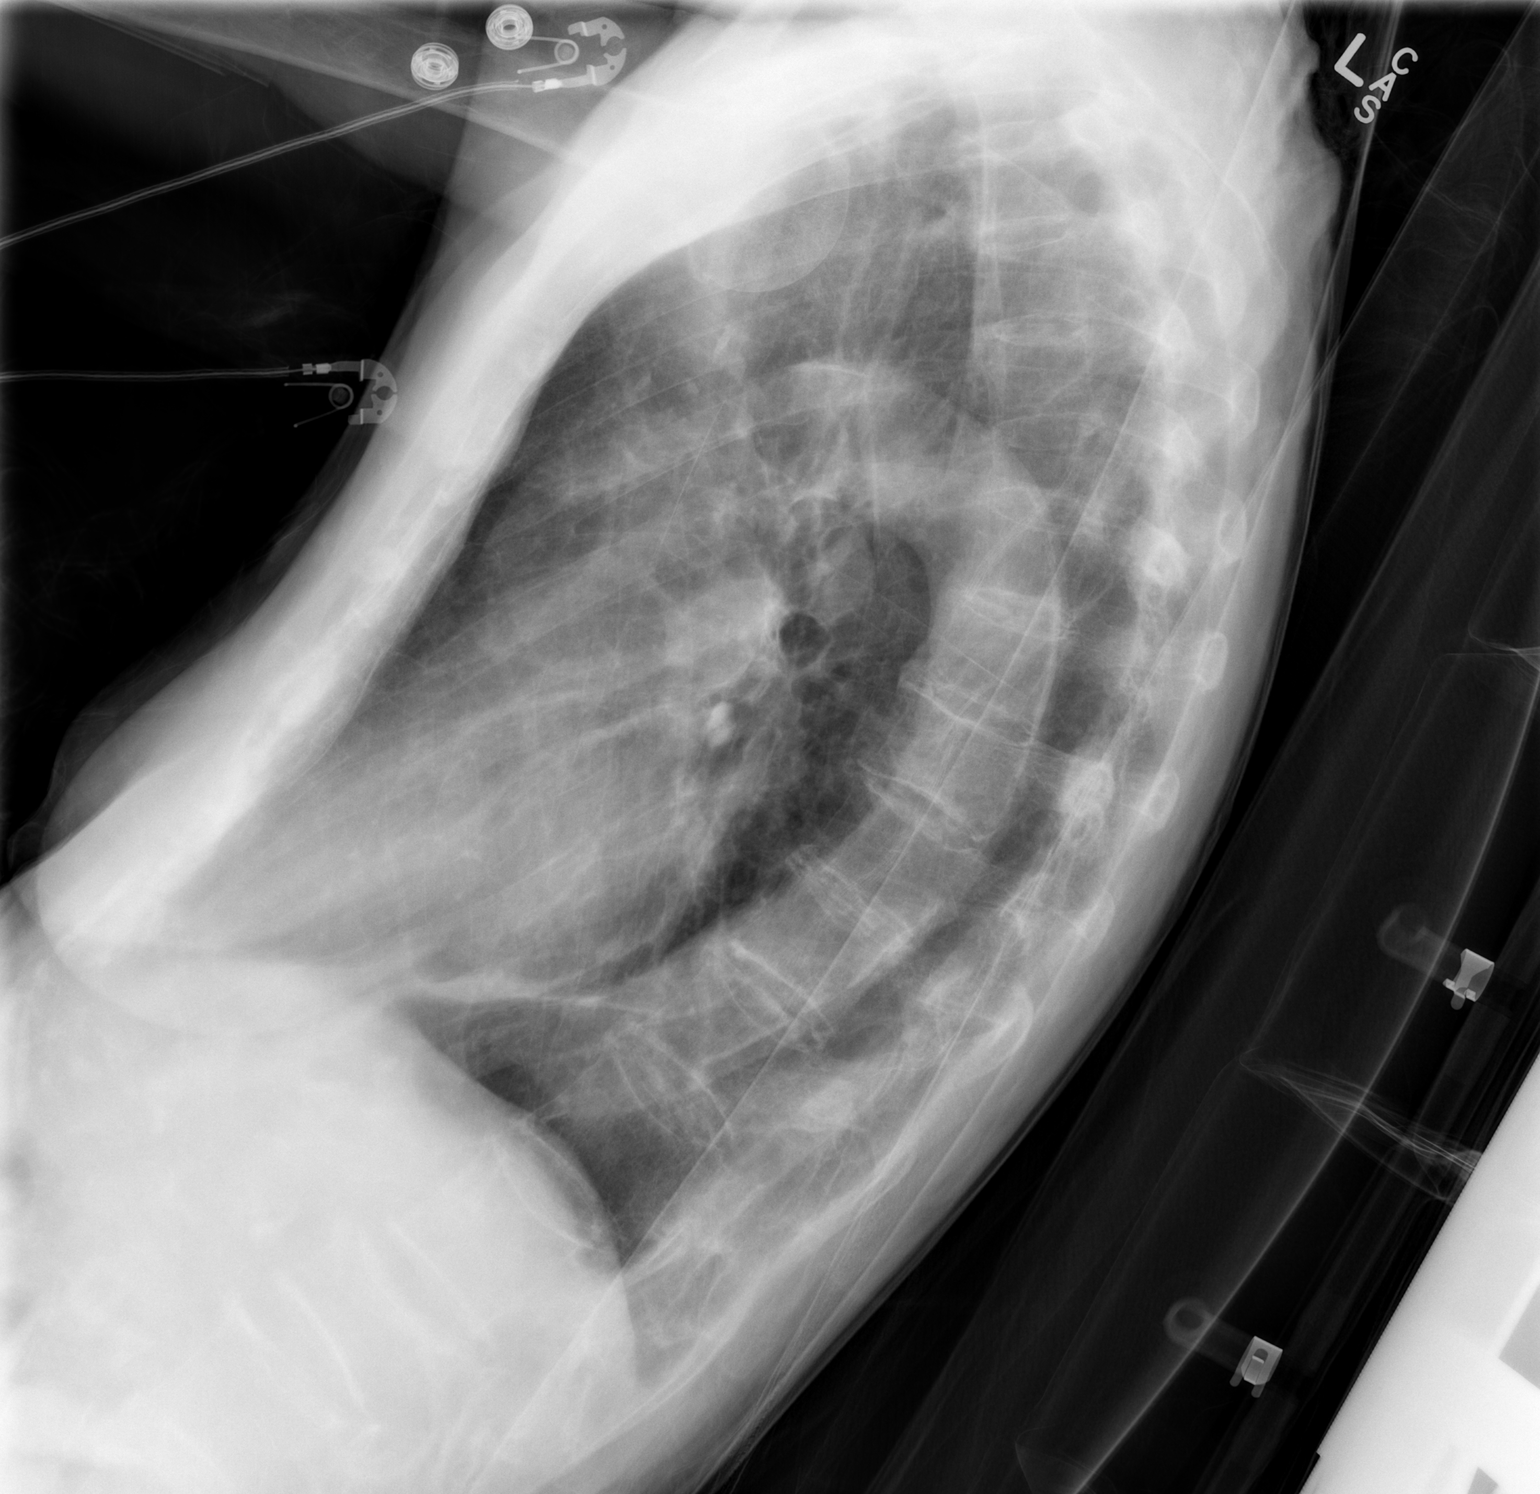

[2 of 2 positions shown; findings below may reference images not displayed]

FINDINGS: The heart remains normal in size. The lungs remain hyperexpanded and
clear. Old, healed left rib fractures are again demonstrated. No
acute fracture or pneumothorax seen. Diffuse osteopenia is noted as
well as mid to lower thoracic spine degenerative changes. Right
shoulder degenerative changes.
IMPRESSION: No acute abnormality. Stable changes of COPD. If there is a clinical
concern for left rib fracture, left rib radiographs would be
recommended.

## 2016-07-28 IMAGING — CT CT HEAD W/O CM
1 series · 16 of 30 positions shown, 20 images · non-contrast
Comparison: 07/09/2013.

CLINICAL DATA: Fall.  Head injury.

EXAM:
CT HEAD WITHOUT CONTRAST
TECHNIQUE: Contiguous axial images were obtained from the base of the skull
through the vertex without intravenous contrast.

[Series 2: head 5.0 h30s · axial · 0.41mm/px · z∈[-146,+4]mm · 16 of 34 slices shown, 20 images]
[im 2/34  brain]
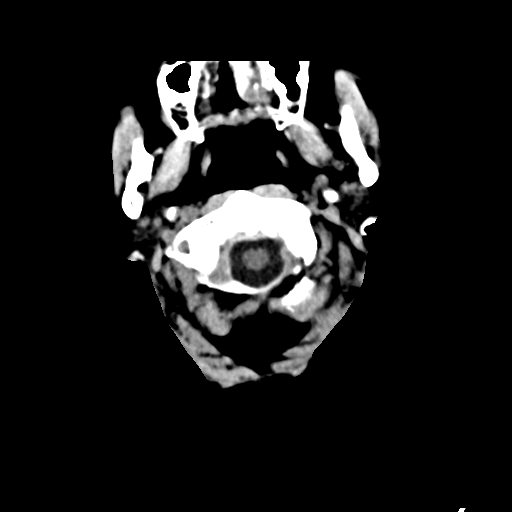
[im 2/34  bone]
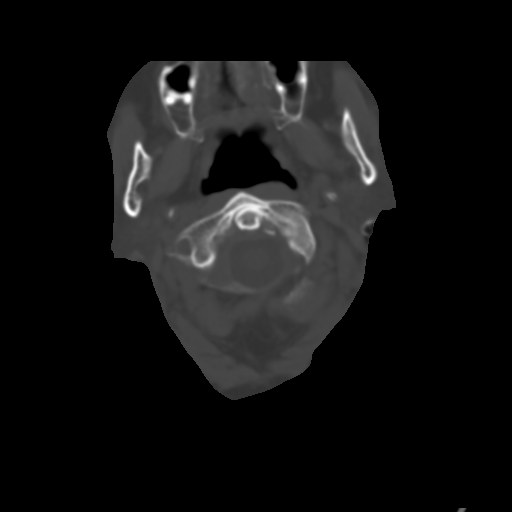
[im 4/34  brain]
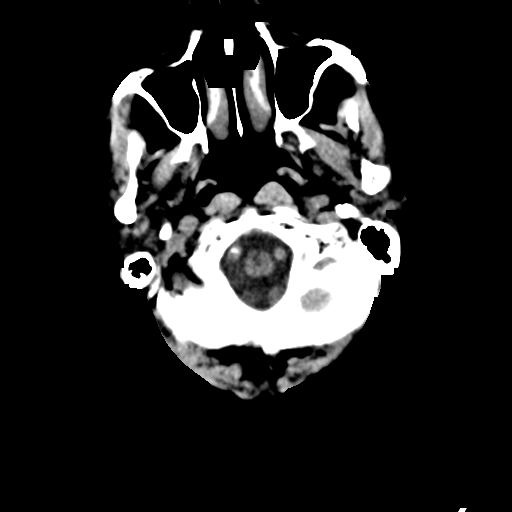
[im 6/34  brain]
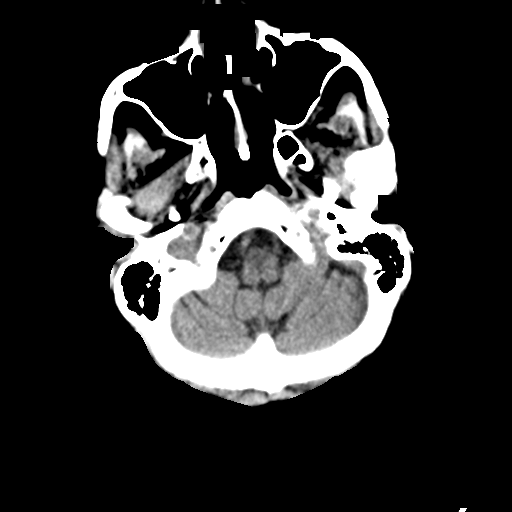
[im 8/34  brain]
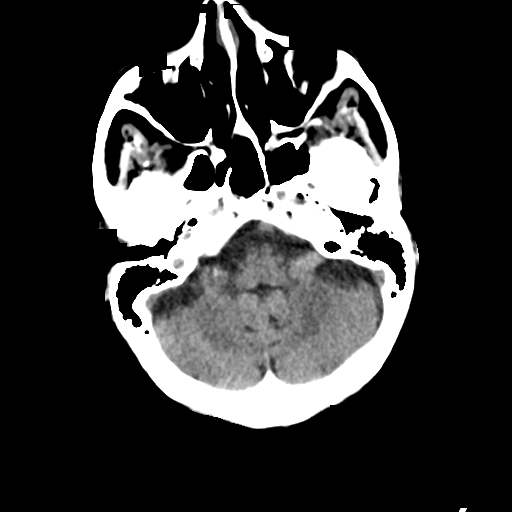
[im 10/34  brain]
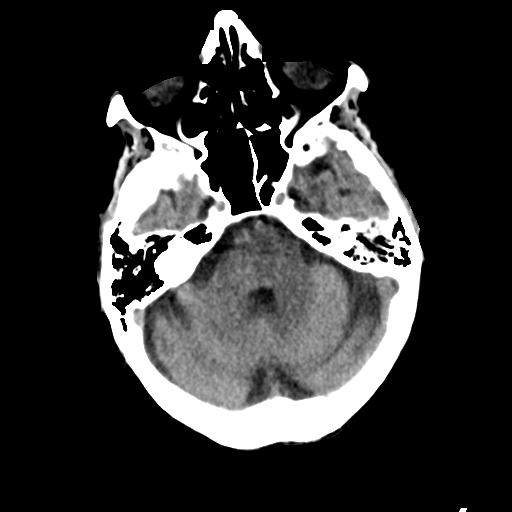
[im 10/34  bone]
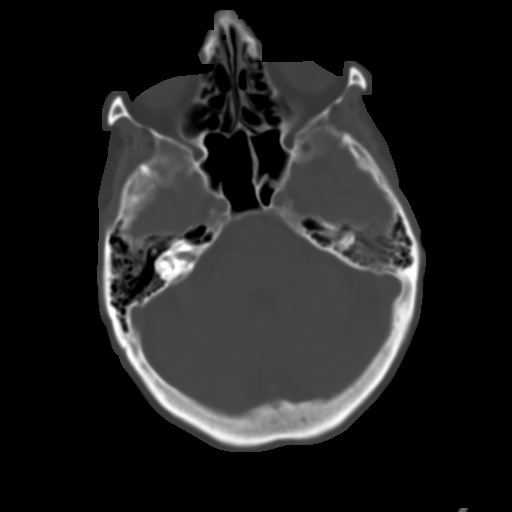
[im 12/34  brain]
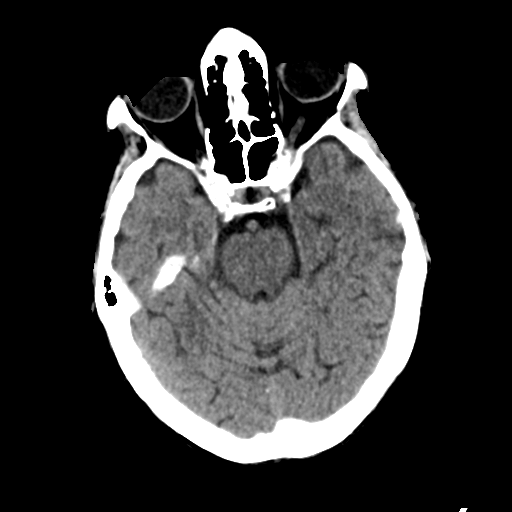
[im 14/34  brain]
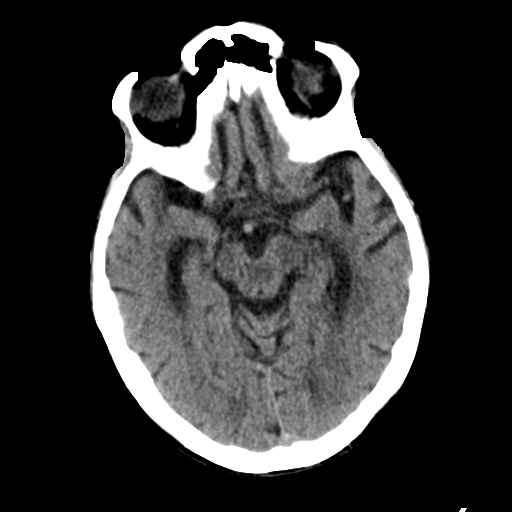
[im 16/34  brain]
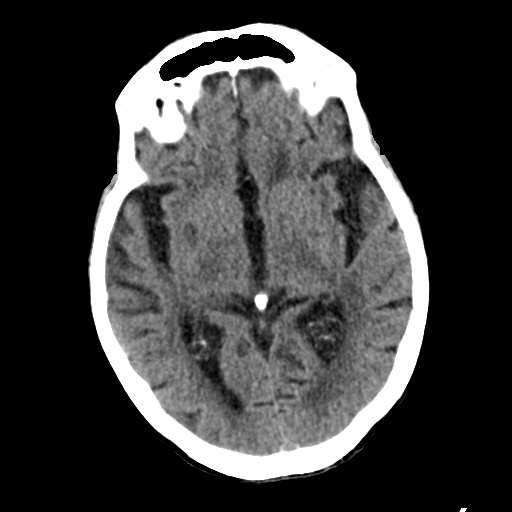
[im 18/34  brain]
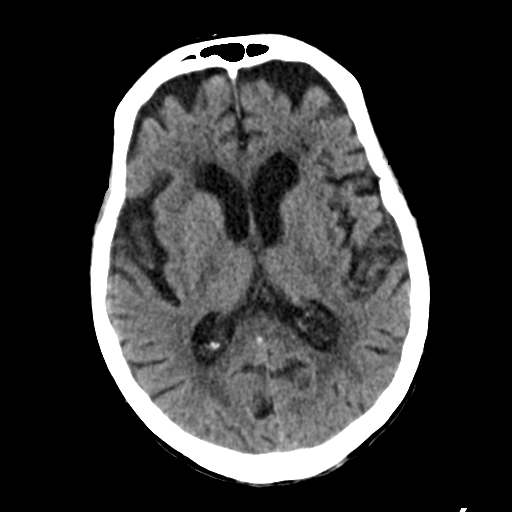
[im 18/34  bone]
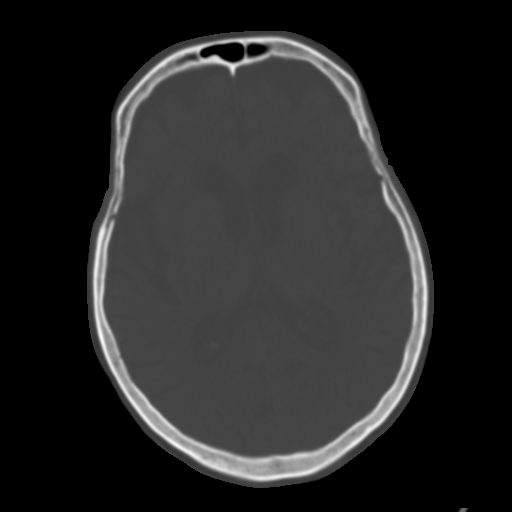
[im 20/34  brain]
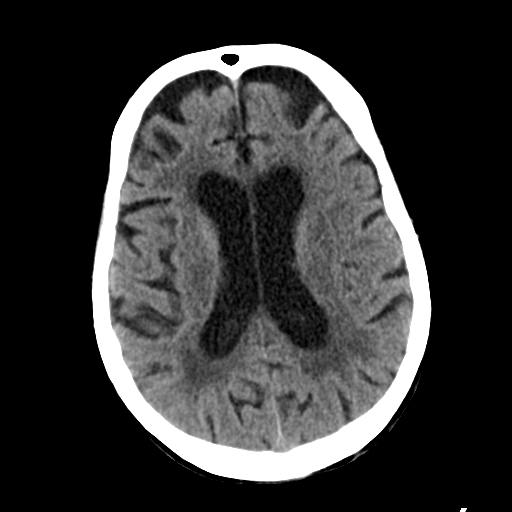
[im 22/34  brain]
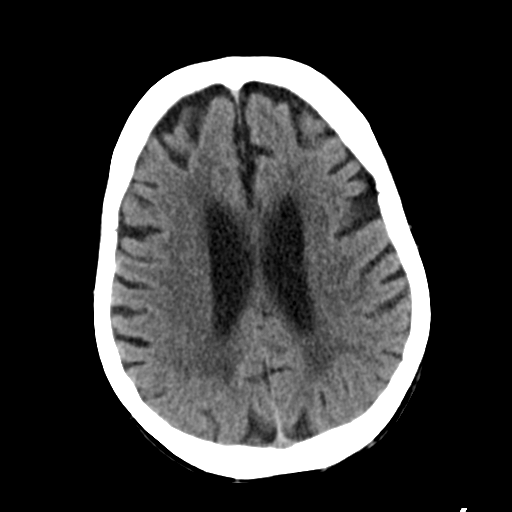
[im 24/34  brain]
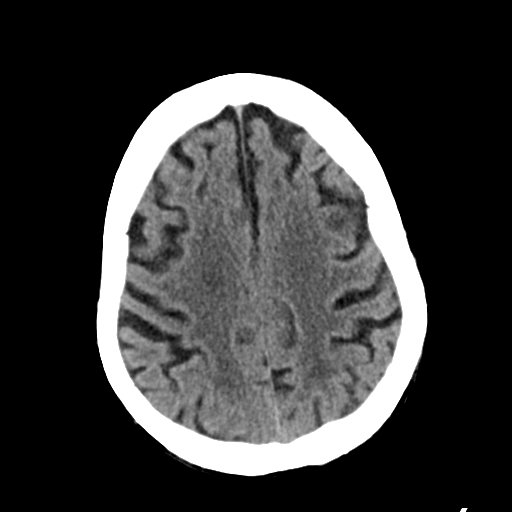
[im 26/34  brain]
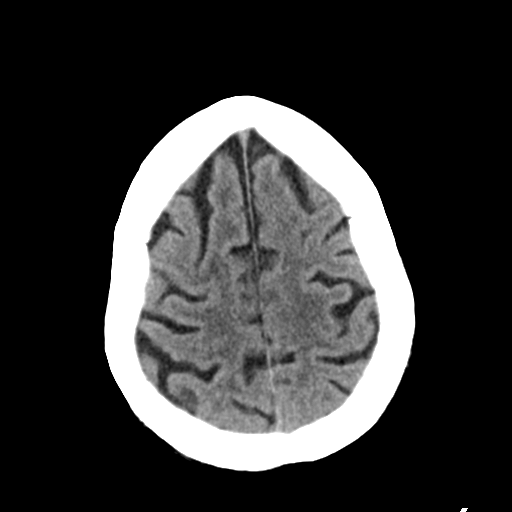
[im 26/34  bone]
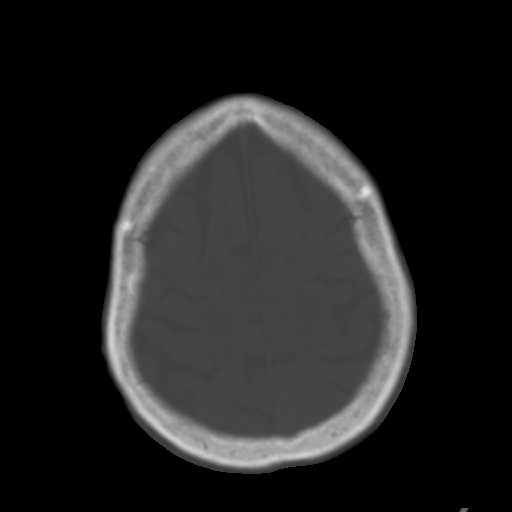
[im 28/34  brain]
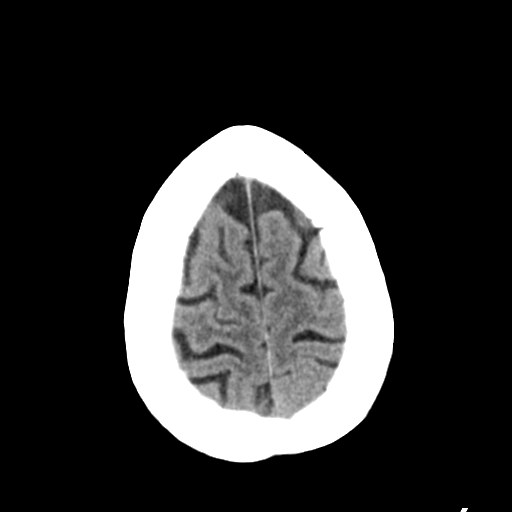
[im 30/34  brain]
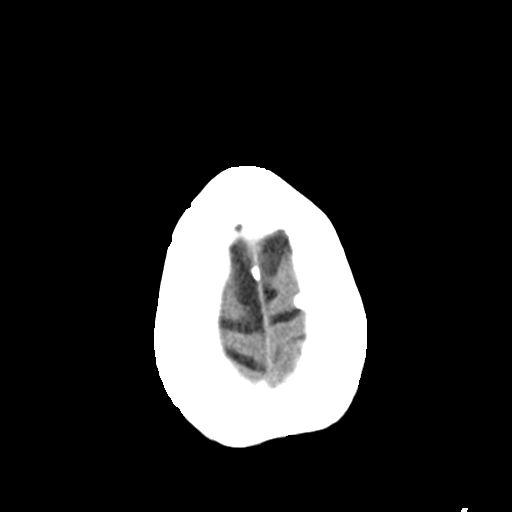
[im 32/34  brain]
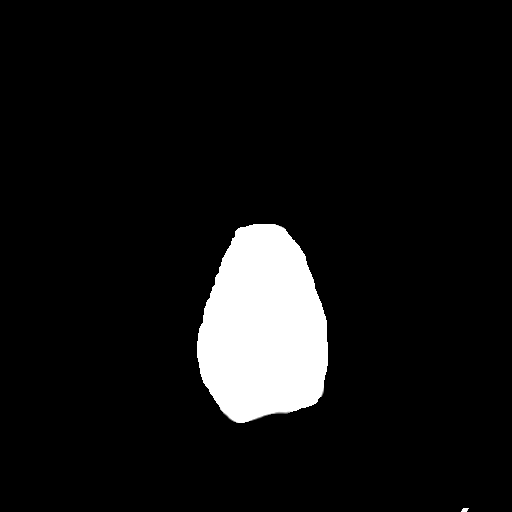

[16 of 30 positions shown; findings below may reference images not displayed]

FINDINGS: Low attenuation throughout the subcortical and periventricular white
matter noted compatible with chronic small vessel ischemic change.
There is prominence of the sulci and ventricles compatible with
brain atrophy. There is no evidence for acute intracranial
hemorrhage, infarct or mass. The paranasal sinuses appear clear. The
mastoid air cells are clear. The skull is intact.
IMPRESSION: 1. No acute intracranial abnormalities.
2. Small vessel ischemic disease and brain atrophy.

## 2016-07-28 IMAGING — CR DG PELVIS 1-2V
1 series · 1 of 1 positions shown · non-contrast
Comparison: Abdominal radiographs 12/05/2004.

CLINICAL DATA: Left pelvic pain after falling.  Initial encounter.

EXAM:
PELVIS - 1-2 VIEW

[t pelvis a.p.]
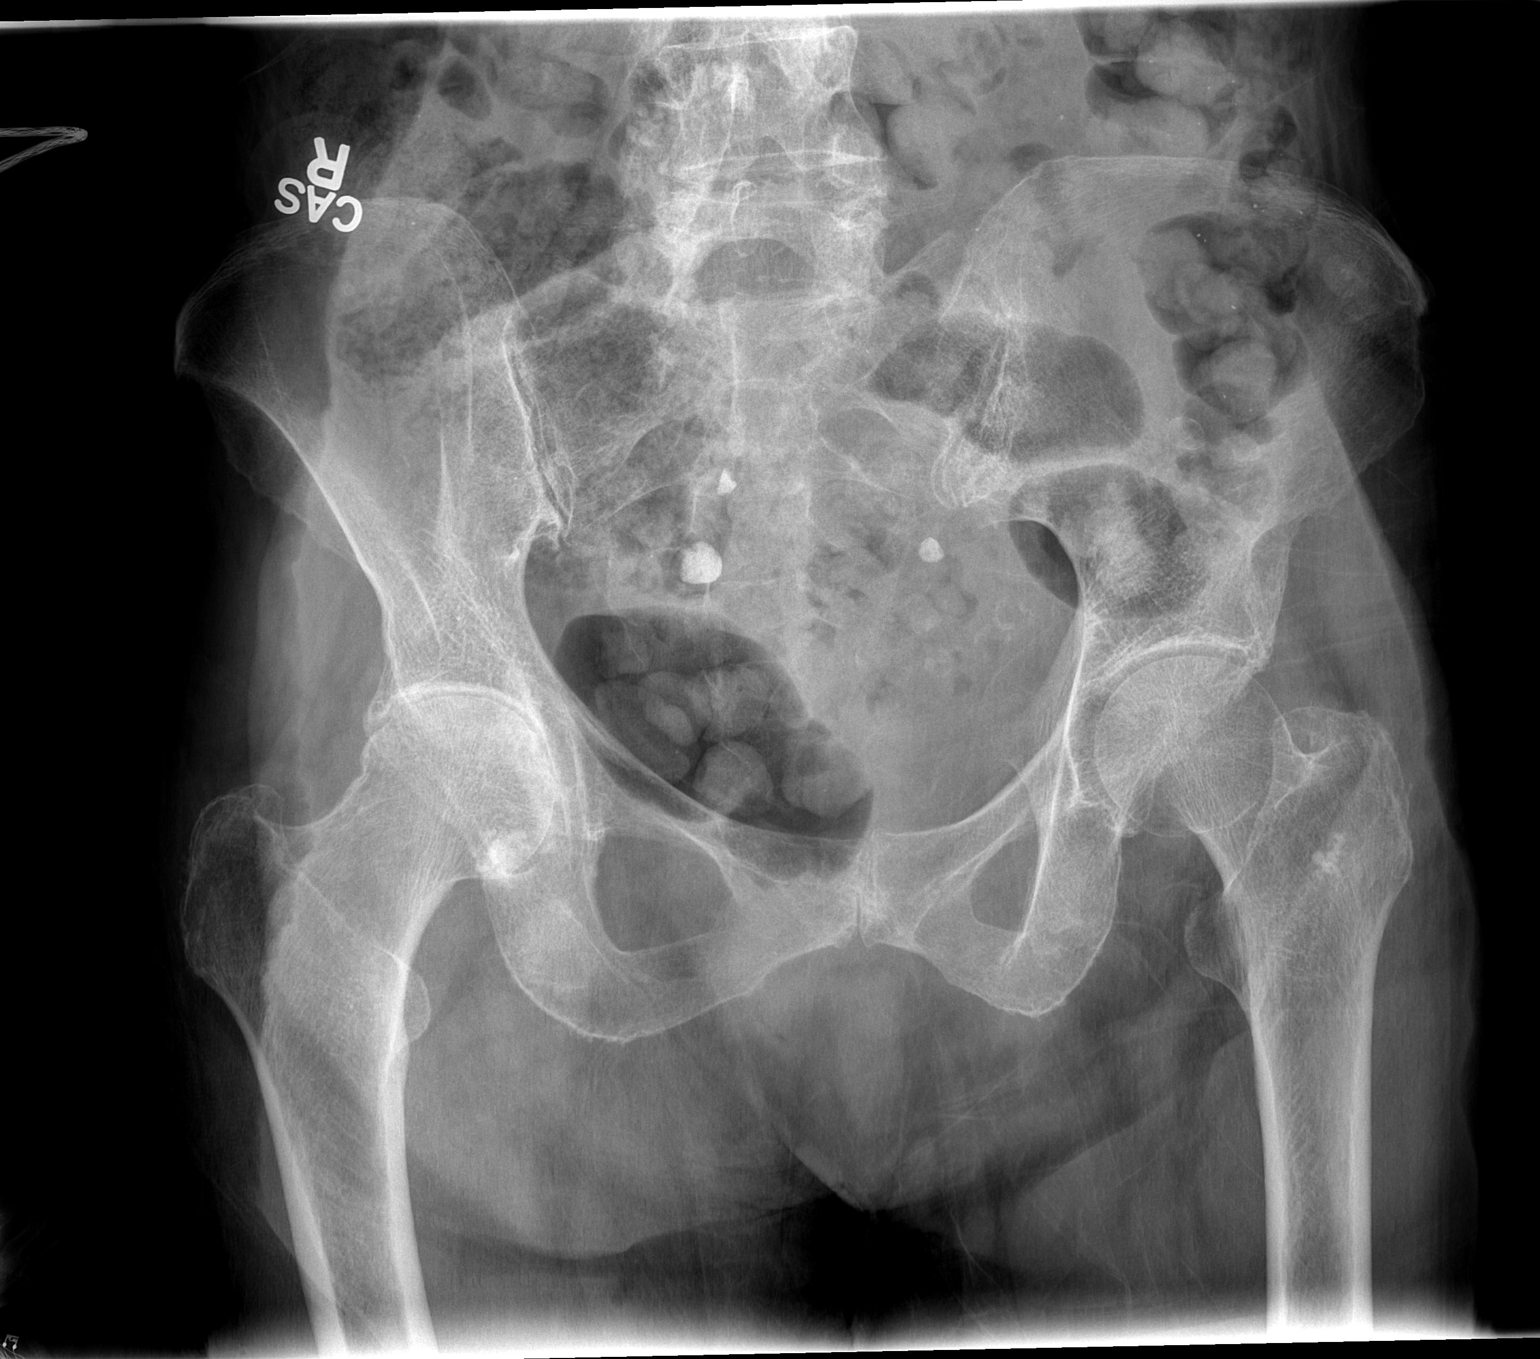

[1 of 1 positions shown; findings below may reference images not displayed]

FINDINGS: The bones are diffusely demineralized. There is no evidence of acute
fracture or sacroiliac joint diastasis. There are degenerative
changes at the sacroiliac joints, both hips and the symphysis pubis.
Radiodensities overlapping the pelvis are likely related to retained
contrast within colonic diverticula.
IMPRESSION: No evidence of acute pelvic fracture or dislocation. Degenerative
changes as described.
